# Patient Record
Sex: Female | Born: 1990 | Race: White | Hispanic: No | Marital: Married | Smoking: Never smoker
Health system: Southern US, Community
[De-identification: ages and names within clinical notes are randomized; demographics above are authoritative.]

## PROBLEM LIST (undated history)

## (undated) ENCOUNTER — Inpatient Hospital Stay (HOSPITAL_COMMUNITY): Payer: Self-pay

## (undated) DIAGNOSIS — K219 Gastro-esophageal reflux disease without esophagitis: Secondary | ICD-10-CM

## (undated) DIAGNOSIS — T7840XA Allergy, unspecified, initial encounter: Secondary | ICD-10-CM

## (undated) DIAGNOSIS — B019 Varicella without complication: Secondary | ICD-10-CM

## (undated) DIAGNOSIS — G08 Intracranial and intraspinal phlebitis and thrombophlebitis: Secondary | ICD-10-CM

## (undated) HISTORY — DX: Intracranial and intraspinal phlebitis and thrombophlebitis: G08

## (undated) HISTORY — PX: NO PAST SURGERIES: SHX2092

## (undated) HISTORY — PX: OTHER SURGICAL HISTORY: SHX169

## (undated) HISTORY — DX: Varicella without complication: B01.9

## (undated) HISTORY — DX: Allergy, unspecified, initial encounter: T78.40XA

---
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
## 2014-09-27 ENCOUNTER — Encounter: Payer: Self-pay | Admitting: Women's Health

## 2014-09-27 ENCOUNTER — Other Ambulatory Visit (HOSPITAL_COMMUNITY)
Admission: RE | Admit: 2014-09-27 | Discharge: 2014-09-27 | Disposition: A | Discharge status: Home / Self Care | Payer: BLUE CROSS/BLUE SHIELD | Source: Ambulatory Visit | Attending: Gynecology | Admitting: Gynecology

## 2014-09-27 ENCOUNTER — Ambulatory Visit (INDEPENDENT_AMBULATORY_CARE_PROVIDER_SITE_OTHER): Payer: BLUE CROSS/BLUE SHIELD | Admitting: Women's Health

## 2014-09-27 VITALS — BP 120/80 | Ht 62.0 in | Wt 194.0 lb

## 2014-09-27 DIAGNOSIS — Z01411 Encounter for gynecological examination (general) (routine) with abnormal findings: Secondary | ICD-10-CM | POA: Diagnosis not present

## 2014-09-27 DIAGNOSIS — Z01419 Encounter for gynecological examination (general) (routine) without abnormal findings: Secondary | ICD-10-CM | POA: Diagnosis not present

## 2014-09-27 DIAGNOSIS — Z23 Encounter for immunization: Secondary | ICD-10-CM

## 2014-09-27 DIAGNOSIS — Z30011 Encounter for initial prescription of contraceptive pills: Secondary | ICD-10-CM | POA: Diagnosis not present

## 2014-09-27 LAB — CBC WITH DIFFERENTIAL/PLATELET
Basophils Absolute: 0.1 10*3/uL (ref 0.0–0.1)
Basophils Relative: 1 % (ref 0–1)
Eosinophils Absolute: 0 10*3/uL (ref 0.0–0.7)
Eosinophils Relative: 0 % (ref 0–5)
HCT: 43.7 % (ref 36.0–46.0)
Hemoglobin: 14.1 g/dL (ref 12.0–15.0)
Lymphocytes Relative: 29 % (ref 12–46)
Lymphs Abs: 1.9 10*3/uL (ref 0.7–4.0)
MCH: 29 pg (ref 26.0–34.0)
MCHC: 32.3 g/dL (ref 30.0–36.0)
MCV: 89.9 fL (ref 78.0–100.0)
MPV: 11.5 fL (ref 8.6–12.4)
Monocytes Absolute: 0.5 10*3/uL (ref 0.1–1.0)
Monocytes Relative: 7 % (ref 3–12)
Neutro Abs: 4.2 10*3/uL (ref 1.7–7.7)
Neutrophils Relative %: 63 % (ref 43–77)
Platelets: 250 10*3/uL (ref 150–400)
RBC: 4.86 MIL/uL (ref 3.87–5.11)
RDW: 15.2 % (ref 11.5–15.5)
WBC: 6.7 10*3/uL (ref 4.0–10.5)

## 2014-09-27 MED ORDER — DESOGESTREL-ETHINYL ESTRADIOL 0.15-0.02/0.01 MG (21/5) PO TABS: 1.0000 | ORAL_TABLET | Freq: Every day | ORAL | Status: DC

## 2014-09-27 NOTE — Progress Notes (Signed)
Maria Brooks A Strader Oct 05, 1990 [id number]    History:    Presents for annual exam.   Regular monthly cycle/condoms. First partner , he had negative STD screen. Did not receive gardasil. Has not had a Pap smear.  Past medical history, past surgical history, family history and social history were all reviewed and documented in the EPIC chart.  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 Reports parents healthy. Getting married next month.  ROS:  A ROS was performed and pertinent positives and negatives are included.  Exam:  Filed Vitals:   09/27/14 1405  BP: 120/80    General appearance:  Normal Thyroid:  Symmetrical, normal in size, without palpable masses or nodularity. Respiratory  Auscultation:  Clear without wheezing or rhonchi Cardiovascular  Auscultation:  Regular rate, without rubs, murmurs or gallops  Edema/varicosities:  Not grossly evident Abdominal  Soft,nontender, without masses, guarding or rebound.  Liver/spleen:  No organomegaly noted  Hernia:  None appreciated  Skin  Inspection:  Grossly normal   Breasts: Examined lying and sitting.     Right: Without masses, retractions, discharge or axillary adenopathy.     Left: Without masses, retractions, discharge or axillary adenopathy. Gentitourinary   Inguinal/mons:  Normal without inguinal adenopathy  External genitalia:  Normal  BUS/Urethra/Skene's glands:  Normal  Vagina:  Normal  Cervix:  Normal  Uterus:   normal in size, shape and contour.  Midline and mobile  Adnexa/parametria:     Rt: Without masses or tenderness.   Lt: Without masses or tenderness.  Anus and perineum: Normal   Assessment/Plan:  24 y.o. SWF G0 for annual exam requesting contraception.    contraception management   Gardasil  obesity  Plan: contraception options reviewed, will try Mircette, prescription, proper use, slight risk for blood clots and strokes reviewed. Cycle was greater than one week ago will  start NuvaRing today remove April 9 have cycle and then start pills April 14th. Reviewed first month not contraceptive continue condoms.  SBE's, increase regular exercise and decrease calories for weight loss, calcium rich diet, MVI daily encouraged. First gardasil given, return to office in 2 and 6 months to complete series. CBC, rubella titer, UA, pap. Pap screening guidelines reviewed.   Harrington ChallengerYOUNG,NANCY J [facility] HospitalWHNP, 2:37 PM 09/27/2014

## 2014-09-27 NOTE — Addendum Note (Signed)
Addended by: Kem ParkinsonBARNES, Elai Vanwyk on: 09/27/2014 02:57 PM   Modules accepted: Orders

## 2014-09-27 NOTE — Patient Instructions (Signed)
Health Maintenance Adopting a healthy lifestyle and getting preventive care can go a long way to promote health and wellness. Talk with your health care provider about what schedule of regular examinations is right for you. This is a good chance for you to check in with your provider about disease prevention and staying healthy. In between checkups, there are plenty of things you can do on your own. Experts have done a lot of research about which lifestyle changes and preventive measures are most likely to keep you healthy. Ask your health care provider for more information. WEIGHT AND DIET  Eat a healthy diet  Be sure to include plenty of vegetables, fruits, low-fat dairy products, and lean protein.  Do not eat a lot of foods high in solid fats, added sugars, or salt.  Get regular exercise. This is one of the most important things you can do for your health.  Most adults should exercise for at least 150 minutes each week. The exercise should increase your heart rate and make you sweat (moderate-intensity exercise).  Most adults should also do strengthening exercises at least twice a week. This is in addition to the moderate-intensity exercise.  Maintain a healthy weight  Body mass index (BMI) is a measurement that can be used to identify possible weight problems. It estimates body fat based on height and weight. Your health care provider can help determine your BMI and help you achieve or maintain a healthy weight.  For females 25 years of age and older:   A BMI below 18.5 is considered underweight.  A BMI of 18.5 to 24.9 is normal.  A BMI of 25 to 29.9 is considered overweight.  A BMI of 30 and above is considered obese.  Watch levels of cholesterol and blood lipids  You should start having your blood tested for lipids and cholesterol at 24 years of age, then have this test every 5 years.  You may need to have your cholesterol levels checked more often if:  Your lipid or  cholesterol levels are high.  You are older than 24 years of age.  You are at high risk for heart disease.  CANCER SCREENING   Lung Cancer  Lung cancer screening is recommended for adults 97-92 years old who are at high risk for lung cancer because of a history of smoking.  A yearly low-dose CT scan of the lungs is recommended for people who:  Currently smoke.  Have quit within the past 15 years.  Have at least a 30-pack-year history of smoking. A pack year is smoking an average of one pack of cigarettes a day for 1 year.  Yearly screening should continue until it has been 15 years since you quit.  Yearly screening should stop if you develop a health problem that would prevent you from having lung cancer treatment.  Breast Cancer  Practice breast self-awareness. This means understanding how your breasts normally appear and feel.  It also means doing regular breast self-exams. Let your health care provider know about any changes, no matter how small.  If you are in your 20s or 30s, you should have a clinical breast exam (CBE) by a health care provider every 1-3 years as part of a regular health exam.  If you are 76 or older, have a CBE every year. Also consider having a breast X-ray (mammogram) every year.  If you have a family history of breast cancer, talk to your health care provider about genetic screening.  If you are  at high risk for breast cancer, talk to your health care provider about having an MRI and a mammogram every year.  Breast cancer gene (BRCA) assessment is recommended for women who have family members with BRCA-related cancers. BRCA-related cancers include:  Breast.  Ovarian.  Tubal.  Peritoneal cancers.  Results of the assessment will determine the need for genetic counseling and BRCA1 and BRCA2 testing. Cervical Cancer Routine pelvic examinations to screen for cervical cancer are no longer recommended for nonpregnant women who are considered low  risk for cancer of the pelvic organs (ovaries, uterus, and vagina) and who do not have symptoms. A pelvic examination may be necessary if you have symptoms including those associated with pelvic infections. Ask your health care provider if a screening pelvic exam is right for you.   The Pap test is the screening test for cervical cancer for women who are considered at risk.  If you had a hysterectomy for a problem that was not cancer or a condition that could lead to cancer, then you no longer need Pap tests.  If you are older than 65 years, and you have had normal Pap tests for the past 10 years, you no longer need to have Pap tests.  If you have had past treatment for cervical cancer or a condition that could lead to cancer, you need Pap tests and screening for cancer for at least 20 years after your treatment.  If you no longer get a Pap test, assess your risk factors if they change (such as having a new sexual partner). This can affect whether you should start being screened again.  Some women have medical problems that increase their chance of getting cervical cancer. If this is the case for you, your health care provider may recommend more frequent screening and Pap tests.  The human papillomavirus (HPV) test is another test that may be used for cervical cancer screening. The HPV test looks for the virus that can cause cell changes in the cervix. The cells collected during the Pap test can be tested for HPV.  The HPV test can be used to screen women 30 years of age and older. Getting tested for HPV can extend the interval between normal Pap tests from three to five years.  An HPV test also should be used to screen women of any age who have unclear Pap test results.  After 24 years of age, women should have HPV testing as often as Pap tests.  Colorectal Cancer  This type of cancer can be detected and often prevented.  Routine colorectal cancer screening usually begins at 24 years of  age and continues through 24 years of age.  Your health care provider may recommend screening at an earlier age if you have risk factors for colon cancer.  Your health care provider may also recommend using home test kits to check for hidden blood in the stool.  A small camera at the end of a tube can be used to examine your colon directly (sigmoidoscopy or colonoscopy). This is done to check for the earliest forms of colorectal cancer.  Routine screening usually begins at age 50.  Direct examination of the colon should be repeated every 5-10 years through 24 years of age. However, you may need to be screened more often if early forms of precancerous polyps or small growths are found. Skin Cancer  Check your skin from head to toe regularly.  Tell your health care provider about any new moles or changes in   moles, especially if there is a change in a mole's shape or color.  Also tell your health care provider if you have a mole that is larger than the size of a pencil eraser.  Always use sunscreen. Apply sunscreen liberally and repeatedly throughout the day.  Protect yourself by wearing long sleeves, pants, a wide-brimmed hat, and sunglasses whenever you are outside. HEART DISEASE, DIABETES, AND HIGH BLOOD PRESSURE   Have your blood pressure checked at least every 1-2 years. High blood pressure causes heart disease and increases the risk of stroke.  If you are between 75 years and 42 years old, ask your health care provider if you should take aspirin to prevent strokes.  Have regular diabetes screenings. This involves taking a blood sample to check your fasting blood sugar level.  If you are at a normal weight and have a low risk for diabetes, have this test once every three years after 25 years of age.  If you are overweight and have a high risk for diabetes, consider being tested at a younger age or more often. PREVENTING INFECTION  Hepatitis B  If you have a higher risk for  hepatitis B, you should be screened for this virus. You are considered at high risk for hepatitis B if:  You were born in a country where hepatitis B is common. Ask your health care provider which countries are considered high risk.  Your parents were born in a high-risk country, and you have not been immunized against hepatitis B (hepatitis B vaccine).  You have HIV or AIDS.  You use needles to inject street drugs.  You live with someone who has hepatitis B.  You have had sex with someone who has hepatitis B.  You get hemodialysis treatment.  You take certain medicines for conditions, including cancer, organ transplantation, and autoimmune conditions. Hepatitis C  Blood testing is recommended for:  Everyone born from [birth year] through 1965.  Anyone with known risk factors for hepatitis C. Sexually transmitted infections (STIs)  You should be screened for sexually transmitted infections (STIs) including gonorrhea and chlamydia if:  You are sexually active and are younger than 24 years of age.  You are older than 24 years of age and your health care provider tells you that you are at risk for this type of infection.  Your sexual activity has changed since you were last screened and you are at an increased risk for chlamydia or gonorrhea. Ask your health care provider if you are at risk.  If you do not have HIV, but are at risk, it may be recommended that you take a prescription medicine daily to prevent HIV infection. This is called pre-exposure prophylaxis (PrEP). You are considered at risk if:  You are sexually active and do not regularly use condoms or know the HIV status of your partner(s).  You take drugs by injection.  You are sexually active with a partner who has HIV. Talk with your health care provider about whether you are at high risk of being infected with HIV. If you choose to begin PrEP, you should first be tested for HIV. You should then be tested every 3 months for  as long as you are taking PrEP.  PREGNANCY   If you are premenopausal and you may become pregnant, ask your health care provider about preconception counseling.  If you may become pregnant, take 400 to 800 micrograms (mcg) of folic acid every day.  If you want to prevent pregnancy, talk to your  health care provider about birth control (contraception). OSTEOPOROSIS AND MENOPAUSE   Osteoporosis is a disease in which the bones lose minerals and strength with aging. This can result in serious bone fractures. Your risk for osteoporosis can be identified using a bone density scan.  If you are 8 years of age or older, or if you are at risk for osteoporosis and fractures, ask your health care provider if you should be screened.  Ask your health care provider whether you should take a calcium or vitamin D supplement to lower your risk for osteoporosis.  Menopause may have certain physical symptoms and risks.  Hormone replacement therapy may reduce some of these symptoms and risks. Talk to your health care provider about whether hormone replacement therapy is right for you.  HOME CARE INSTRUCTIONS   Schedule regular health, dental, and eye exams.  Stay current with your immunizations.   Do not use any tobacco products including cigarettes, chewing tobacco, or electronic cigarettes.  If you are pregnant, do not drink alcohol.  If you are breastfeeding, limit how much and how often you drink alcohol.  Limit alcohol intake to no more than 1 drink per day for nonpregnant women. One drink equals 12 ounces of beer, 5 ounces of wine, or 1 ounces of hard liquor.  Do not use street drugs.  Do not share needles.  Ask your health care provider for help if you need support or information about quitting drugs.  Tell your health care provider if you often feel depressed.  Tell your health care provider if you have ever been abused or do not feel safe at home. Document Released: 01/14/2011  Document Revised: 11/15/2013 Document Reviewed: 06/02/2013 [facility] Patient Information 2015 [place], [place]. This information is not intended to replace advice given to you by your health care provider. Make sure you discuss any questions you have with your health care provider. Fat and Cholesterol Control Diet Fat and cholesterol levels in your blood and organs are influenced by your diet. High levels of fat and cholesterol may lead to diseases of the heart, small and large blood vessels, gallbladder, liver, and pancreas. CONTROLLING FAT AND CHOLESTEROL WITH DIET Although exercise and lifestyle factors are important, your diet is key. That is because certain foods are known to raise cholesterol and others to lower it. The goal is to balance foods for their effect on cholesterol and more importantly, to replace saturated and trans fat with other types of fat, such as monounsaturated fat, polyunsaturated fat, and omega-3 fatty acids. On average, a person should consume no more than 15 to 17 g of saturated fat daily. Saturated and trans fats are considered "bad" fats, and they will raise LDL cholesterol. Saturated fats are primarily found in animal products such as meats, butter, and cream. However, that does not mean you need to give up all your favorite foods. Today, there are good tasting, low-fat, low-cholesterol substitutes for most of the things you like to eat. Choose low-fat or nonfat alternatives. Choose round or loin cuts of red meat. These types of cuts are lowest in fat and cholesterol. Chicken (without the skin), fish, veal, and ground [place] breast are great choices. Eliminate fatty meats, such as hot dogs and salami. Even shellfish have little or no saturated fat. Have a 3 oz (85 g) portion when you eat lean meat, poultry, or fish. Trans fats are also called "partially hydrogenated oils." They are oils that have been scientifically manipulated so that they are solid at room  temperature resulting  in a longer shelf life and improved taste and texture of foods in which they are added. Trans fats are found in stick margarine, some tub margarines, cookies, crackers, and baked goods.  When baking and cooking, oils are a great substitute for butter. The monounsaturated oils are especially beneficial since it is believed they lower LDL and raise HDL. The oils you should avoid entirely are saturated tropical oils, such as coconut and palm.  Remember to eat a lot from food groups that are naturally free of saturated and trans fat, including fish, fruit, vegetables, beans, grains (barley, rice, couscous, bulgur wheat), and pasta (without cream sauces).  IDENTIFYING FOODS THAT LOWER FAT AND CHOLESTEROL  Soluble fiber may lower your cholesterol. This type of fiber is found in fruits such as apples, vegetables such as broccoli, potatoes, and carrots, legumes such as beans, peas, and lentils, and grains such as barley. Foods fortified with plant sterols (phytosterol) may also lower cholesterol. You should eat at least 2 g per day of these foods for a cholesterol lowering effect.  Read package labels to identify low-saturated fats, trans fat free, and low-fat foods at the supermarket. Select cheeses that have only 2 to 3 g saturated fat per ounce. Use a heart-healthy tub margarine that is free of trans fats or partially hydrogenated oil. When buying baked goods (cookies, crackers), avoid partially hydrogenated oils. Breads and muffins should be made from whole grains (whole-wheat or whole oat flour, instead of "flour" or "enriched flour"). Buy non-creamy canned soups with reduced salt and no added fats.  FOOD PREPARATION TECHNIQUES  Never deep-fry. If you must fry, either stir-fry, which uses very little fat, or use non-stick cooking sprays. When possible, broil, bake, or roast meats, and steam vegetables. Instead of putting butter or margarine on vegetables, use lemon and herbs, applesauce, and cinnamon (for squash  and sweet potatoes). Use nonfat yogurt, salsa, and low-fat dressings for salads.  LOW-SATURATED FAT / LOW-FAT FOOD SUBSTITUTES Meats / Saturated Fat (g)  Avoid: Steak, marbled (3 oz/85 g) / 11 g  Choose: Steak, lean (3 oz/85 g) / 4 g  Avoid: Hamburger (3 oz/85 g) / 7 g  Choose: Hamburger, lean (3 oz/85 g) / 5 g  Avoid: Ham (3 oz/85 g) / 6 g  Choose: Ham, lean cut (3 oz/85 g) / 2.4 g  Avoid: Chicken, with skin, dark meat (3 oz/85 g) / 4 g  Choose: Chicken, skin removed, dark meat (3 oz/85 g) / 2 g  Avoid: Chicken, with skin, light meat (3 oz/85 g) / 2.5 g  Choose: Chicken, skin removed, light meat (3 oz/85 g) / 1 g Dairy / Saturated Fat (g)  Avoid: Whole milk (1 cup) / 5 g  Choose: Low-fat milk, 2% (1 cup) / 3 g  Choose: Low-fat milk, 1% (1 cup) / 1.5 g  Choose: Skim milk (1 cup) / 0.3 g  Avoid: Hard cheese (1 oz/28 g) / 6 g  Choose: Skim milk cheese (1 oz/28 g) / 2 to 3 g  Avoid: Cottage cheese, 4% fat (1 cup) / 6.5 g  Choose: Low-fat cottage cheese, 1% fat (1 cup) / 1.5 g  Avoid: Ice cream (1 cup) / 9 g  Choose: Sherbet (1 cup) / 2.5 g  Choose: Nonfat frozen yogurt (1 cup) / 0.3 g  Choose: Frozen fruit bar / trace  Avoid: Whipped cream (1 tbs) / 3.5 g  Choose: Nondairy whipped topping (1 tbs) / 1 g Condiments /  Saturated Fat (g)  Avoid: Mayonnaise (1 tbs) / 2 g  Choose: Low-fat mayonnaise (1 tbs) / 1 g  Avoid: Butter (1 tbs) / 7 g  Choose: Extra light margarine (1 tbs) / 1 g  Avoid: Coconut oil (1 tbs) / 11.8 g  Choose: Olive oil (1 tbs) / 1.8 g  Choose: Corn oil (1 tbs) / 1.7 g  Choose: Safflower oil (1 tbs) / 1.2 g  Choose: Sunflower oil (1 tbs) / 1.4 g  Choose: Soybean oil (1 tbs) / 2.4 g  Choose: Canola oil (1 tbs) / 1 g Document Released: 07/01/2005 Document Revised: 10/26/2012 Document Reviewed: 09/29/2013 [employer] Patient Information 2015 [place], [place]. This information is not intended to replace advice given to you by your health  care provider. Make sure you discuss any questions you have with your health care provider.

## 2014-09-28 LAB — RUBELLA SCREEN: Rubella: 2.44 Index — ABNORMAL HIGH (ref <0.90)

## 2014-09-29 LAB — CYTOLOGY - PAP

## 2014-11-30 ENCOUNTER — Other Ambulatory Visit: Payer: BLUE CROSS/BLUE SHIELD

## 2014-12-08 ENCOUNTER — Ambulatory Visit (INDEPENDENT_AMBULATORY_CARE_PROVIDER_SITE_OTHER): Payer: BLUE CROSS/BLUE SHIELD | Admitting: *Deleted

## 2014-12-08 DIAGNOSIS — Z23 Encounter for immunization: Secondary | ICD-10-CM

## 2015-01-06 ENCOUNTER — Other Ambulatory Visit: Payer: Self-pay

## 2015-01-06 DIAGNOSIS — Z30011 Encounter for initial prescription of contraceptive pills: Secondary | ICD-10-CM

## 2015-01-06 MED ORDER — DESOGESTREL-ETHINYL ESTRADIOL 0.15-0.02/0.01 MG (21/5) PO TABS: 1.0000 | ORAL_TABLET | Freq: Every day | ORAL | Status: DC

## 2015-09-08 ENCOUNTER — Inpatient Hospital Stay
Admission: EM | Admit: 2015-09-08 | Discharge: 2015-09-19 | DRG: 093 | Disposition: A | Discharge status: Home / Self Care | Payer: BLUE CROSS/BLUE SHIELD | Attending: Internal Medicine | Admitting: Internal Medicine

## 2015-09-08 ENCOUNTER — Encounter: Payer: Self-pay | Admitting: *Deleted

## 2015-09-08 DIAGNOSIS — M542 Cervicalgia: Secondary | ICD-10-CM | POA: Diagnosis present

## 2015-09-08 DIAGNOSIS — Z6834 Body mass index (BMI) 34.0-34.9, adult: Secondary | ICD-10-CM

## 2015-09-08 DIAGNOSIS — G08 Intracranial and intraspinal phlebitis and thrombophlebitis: Principal | ICD-10-CM | POA: Diagnosis present

## 2015-09-08 DIAGNOSIS — I639 Cerebral infarction, unspecified: Secondary | ICD-10-CM

## 2015-09-08 DIAGNOSIS — Z79899 Other long term (current) drug therapy: Secondary | ICD-10-CM

## 2015-09-08 DIAGNOSIS — E669 Obesity, unspecified: Secondary | ICD-10-CM | POA: Diagnosis present

## 2015-09-08 NOTE — ED Notes (Signed)
States right side neck pain radiating up to her head, states pressure behind her eyes, states she was seen at urgent care yesterday but the headache is still there

## 2015-09-08 NOTE — ED Notes (Signed)
Pt reports right sided neck pain radiating up head, felt behind her eyes.  Pt went to urgent care prescribed naproxen and robaxin, taking w/o relief.  Pain x 4 days.  PT NAD at this time, respirations equal and unlabored, skin warm and dry.

## 2015-09-09 ENCOUNTER — Emergency Department: Payer: BLUE CROSS/BLUE SHIELD

## 2015-09-09 DIAGNOSIS — I639 Cerebral infarction, unspecified: Secondary | ICD-10-CM | POA: Diagnosis present

## 2015-09-09 DIAGNOSIS — M542 Cervicalgia: Secondary | ICD-10-CM | POA: Diagnosis present

## 2015-09-09 DIAGNOSIS — E669 Obesity, unspecified: Secondary | ICD-10-CM | POA: Diagnosis present

## 2015-09-09 DIAGNOSIS — G08 Intracranial and intraspinal phlebitis and thrombophlebitis: Secondary | ICD-10-CM | POA: Diagnosis not present

## 2015-09-09 DIAGNOSIS — Z6834 Body mass index (BMI) 34.0-34.9, adult: Secondary | ICD-10-CM | POA: Diagnosis not present

## 2015-09-09 DIAGNOSIS — Z79899 Other long term (current) drug therapy: Secondary | ICD-10-CM | POA: Diagnosis not present

## 2015-09-09 HISTORY — DX: Intracranial and intraspinal phlebitis and thrombophlebitis: G08

## 2015-09-09 LAB — CBC WITH DIFFERENTIAL/PLATELET
BASOS ABS: 0 10*3/uL (ref 0–0.1)
BASOS PCT: 0 %
EOS ABS: 0 10*3/uL (ref 0–0.7)
EOS PCT: 0 %
HCT: 44.2 % (ref 35.0–47.0)
Hemoglobin: 14.9 g/dL (ref 12.0–16.0)
Lymphocytes Relative: 18 %
Lymphs Abs: 1.5 10*3/uL (ref 1.0–3.6)
MCH: 30.1 pg (ref 26.0–34.0)
MCHC: 33.7 g/dL (ref 32.0–36.0)
MCV: 89.3 fL (ref 80.0–100.0)
MONO ABS: 0.4 10*3/uL (ref 0.2–0.9)
Monocytes Relative: 5 %
Neutro Abs: 6.3 10*3/uL (ref 1.4–6.5)
Neutrophils Relative %: 77 %
PLATELETS: 192 10*3/uL (ref 150–440)
RBC: 4.95 MIL/uL (ref 3.80–5.20)
RDW: 14.3 % (ref 11.5–14.5)
WBC: 8.3 10*3/uL (ref 3.6–11.0)

## 2015-09-09 LAB — POCT PREGNANCY, URINE: PREG TEST UR: NEGATIVE

## 2015-09-09 LAB — TSH: TSH: 3.961 u[IU]/mL (ref 0.350–4.500)

## 2015-09-09 LAB — PROTIME-INR
INR: 1.12
PROTHROMBIN TIME: 14.6 s (ref 11.4–15.0)

## 2015-09-09 LAB — BASIC METABOLIC PANEL
Anion gap: 9 (ref 5–15)
BUN: 9 mg/dL (ref 6–20)
CALCIUM: 8.8 mg/dL — AB (ref 8.9–10.3)
CO2: 23 mmol/L (ref 22–32)
CREATININE: 0.77 mg/dL (ref 0.44–1.00)
Chloride: 107 mmol/L (ref 101–111)
GFR calc Af Amer: >60 mL/min (ref >60)
GLUCOSE: 89 mg/dL (ref 65–99)
Potassium: 3.7 mmol/L (ref 3.5–5.1)
SODIUM: 139 mmol/L (ref 135–145)

## 2015-09-09 LAB — ANTITHROMBIN III: AntiThromb III Func: 111 % (ref 75–120)

## 2015-09-09 LAB — HEPARIN LEVEL (UNFRACTIONATED): HEPARIN UNFRACTIONATED: 0.58 [IU]/mL (ref 0.30–0.70)

## 2015-09-09 LAB — APTT: aPTT: 31 seconds (ref 24–36)

## 2015-09-09 MED ORDER — ENOXAPARIN SODIUM 100 MG/ML ~~LOC~~ SOLN
SUBCUTANEOUS | Status: AC
  Administered 2015-09-09: 90 mg via SUBCUTANEOUS
  Filled 2015-09-09: qty 1

## 2015-09-09 MED ORDER — DOCUSATE SODIUM 100 MG PO CAPS
100.0000 mg | ORAL_CAPSULE | Freq: Two times a day (BID) | ORAL | Status: DC
  Administered 2015-09-09 – 2015-09-19 (×21): 100 mg via ORAL
  Filled 2015-09-09 (×21): qty 1

## 2015-09-09 MED ORDER — ENOXAPARIN SODIUM 100 MG/ML ~~LOC~~ SOLN
1.0000 mg/kg | Freq: Two times a day (BID) | SUBCUTANEOUS | Status: DC
  Filled 2015-09-09 (×2): qty 1

## 2015-09-09 MED ORDER — ONDANSETRON HCL 4 MG PO TABS
4.0000 mg | ORAL_TABLET | Freq: Four times a day (QID) | ORAL | Status: DC | PRN
  Administered 2015-09-10: 4 mg via ORAL
  Filled 2015-09-09: qty 1

## 2015-09-09 MED ORDER — ACETAMINOPHEN 325 MG PO TABS
650.0000 mg | ORAL_TABLET | Freq: Four times a day (QID) | ORAL | Status: DC | PRN
Start: 2015-09-09 — End: 2015-09-19
  Administered 2015-09-09 – 2015-09-15 (×4): 650 mg via ORAL
  Filled 2015-09-09 (×5): qty 2

## 2015-09-09 MED ORDER — IOHEXOL 350 MG/ML SOLN
100.0000 mL | Freq: Once | INTRAVENOUS | Status: AC | PRN
  Administered 2015-09-09: 100 mL via INTRAVENOUS

## 2015-09-09 MED ORDER — ONDANSETRON HCL 4 MG/2ML IJ SOLN
4.0000 mg | Freq: Four times a day (QID) | INTRAMUSCULAR | Status: DC | PRN
  Administered 2015-09-09 – 2015-09-12 (×4): 4 mg via INTRAVENOUS
  Filled 2015-09-09 (×4): qty 2

## 2015-09-09 MED ORDER — DEXAMETHASONE SODIUM PHOSPHATE 10 MG/ML IJ SOLN
10.0000 mg | Freq: Once | INTRAMUSCULAR | Status: AC
  Administered 2015-09-09: 10 mg via INTRAVENOUS
  Filled 2015-09-09: qty 1

## 2015-09-09 MED ORDER — ACETAMINOPHEN 650 MG RE SUPP: 650.0000 mg | Freq: Four times a day (QID) | RECTAL | Status: DC | PRN

## 2015-09-09 MED ORDER — SODIUM CHLORIDE 0.9 % IV BOLUS (SEPSIS)
1000.0000 mL | INTRAVENOUS | Status: AC
  Administered 2015-09-09: 1000 mL via INTRAVENOUS

## 2015-09-09 MED ORDER — ENOXAPARIN SODIUM 100 MG/ML ~~LOC~~ SOLN
90.0000 mg | Freq: Once | SUBCUTANEOUS | Status: AC
  Administered 2015-09-09: 90 mg via SUBCUTANEOUS

## 2015-09-09 MED ORDER — METOCLOPRAMIDE HCL 5 MG/ML IJ SOLN
10.0000 mg | INTRAMUSCULAR | Status: AC
  Administered 2015-09-09: 10 mg via INTRAVENOUS
  Filled 2015-09-09: qty 2

## 2015-09-09 MED ORDER — DIPHENHYDRAMINE HCL 50 MG/ML IJ SOLN
25.0000 mg | Freq: Once | INTRAMUSCULAR | Status: AC
  Administered 2015-09-09: 25 mg via INTRAVENOUS
  Filled 2015-09-09: qty 1

## 2015-09-09 MED ORDER — CALCIUM CARBONATE ANTACID 500 MG PO CHEW
1.0000 | CHEWABLE_TABLET | Freq: Two times a day (BID) | ORAL | Status: DC
  Administered 2015-09-09 – 2015-09-19 (×21): 200 mg via ORAL
  Filled 2015-09-09 (×21): qty 1

## 2015-09-09 MED ORDER — HEPARIN (PORCINE) IN NACL 100-0.45 UNIT/ML-% IJ SOLN
1150.0000 [IU]/h | INTRAMUSCULAR | Status: DC
  Administered 2015-09-09: 15:00:00 850 [IU]/h via INTRAVENOUS
  Administered 2015-09-10: 1000 [IU]/h via INTRAVENOUS
  Administered 2015-09-11 – 2015-09-16 (×6): 1250 [IU]/h via INTRAVENOUS
  Administered 2015-09-17: 1150 [IU]/h via INTRAVENOUS
  Administered 2015-09-17: 1250 [IU]/h via INTRAVENOUS
  Administered 2015-09-18: 1150 [IU]/h via INTRAVENOUS
  Filled 2015-09-09 (×24): qty 250

## 2015-09-09 MED ORDER — MORPHINE SULFATE (PF) 2 MG/ML IV SOLN
2.0000 mg | INTRAVENOUS | Status: DC | PRN
  Administered 2015-09-09 – 2015-09-12 (×9): 2 mg via INTRAVENOUS
  Filled 2015-09-09 (×9): qty 1

## 2015-09-09 MED ORDER — WARFARIN SODIUM 4 MG PO TABS: 5.0000 mg | ORAL_TABLET | Freq: Once | ORAL | Status: DC

## 2015-09-09 MED ORDER — WARFARIN - PHYSICIAN DOSING INPATIENT: Freq: Every day | Status: DC

## 2015-09-09 MED ORDER — SODIUM CHLORIDE 0.9 % IV SOLN
INTRAVENOUS | Status: DC
  Administered 2015-09-09 – 2015-09-11 (×8): via INTRAVENOUS

## 2015-09-09 MED ORDER — SODIUM CHLORIDE 0.9% FLUSH
3.0000 mL | Freq: Two times a day (BID) | INTRAVENOUS | Status: DC
  Administered 2015-09-11 – 2015-09-19 (×5): 3 mL via INTRAVENOUS

## 2015-09-09 NOTE — ED Notes (Signed)
Per Dr. Sheryle Hail, initiate cardiac monitoring once pt on floor

## 2015-09-09 NOTE — Progress Notes (Signed)
ANTICOAGULATION CONSULT NOTE - Initial Consult  Pharmacy Consult for Heparin Indication: stroke  No Known Allergies  Patient Measurements: Height:  (157.5 cm) Weight: 199 lb 8 oz (90.493 kg) IBW/kg (Calculated) : 50.1 Heparin Dosing Weight: 71 kg  Vital Signs: Temp: 98.7 F (37.1 C) (02/25 0614) Temp Source: Oral (02/25 0614) BP: 118/76 mmHg (02/25 0614) Pulse Rate: 116 (02/25 0614)  Labs:  Recent Labs  09/09/15 0058  HGB 14.9  HCT 44.2  PLT 192  APTT 31  LABPROT 14.6  INR 1.12  CREATININE 0.77    Estimated Creatinine Clearance: 113.5 mL/min (by C-G formula based on Cr of 0.77).   Medical History: History reviewed. No pertinent past medical history.  Medications:  Scheduled:  . calcium carbonate  1 tablet Oral BID WC  . docusate sodium  100 mg Oral BID  . sodium chloride flush  3 mL Intravenous Q12H   Infusions:  . sodium chloride 100 mL/hr at 09/09/15 1610  . 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 Patient received Lovenox 1 mg/kg once this AM.  Goal of Therapy:  Heparin level 0.3- 0.5 units/ml Monitor platelets by anticoagulation protocol: Yes   Plan:  Will treat as CVA with no bolus and lower HL goal. Will begin heparin drip 12 hours after Lovenox dose at 850 units/hr. Will check a HL 6 hours after starting infusion.   Luisa Hart D 09/09/2015,1:16 PM

## 2015-09-09 NOTE — Consult Note (Signed)
CC: headache  HPI: Maria Brooks is an 25 y.o. female with family history of clotting d/o admitted with 3 day history of throbbing HA radiating to her neck.  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  HA has improved. Pt is on Lovenox.    History reviewed. No pertinent past medical history.  Past Surgical History  Procedure Laterality Date  . None      Family History  Problem Relation Age of Onset  . Lung cancer Mother     deceased  . Clotting disorder Mother     reportedly present before developing lung cancer    Social History:  reports that she has never smoked. She does not have any smokeless tobacco history on file. She reports that she does not drink alcohol or use illicit drugs.  No Known Allergies  Medications: I have reviewed the patient's current medications.  ROS: History obtained from the patient  General ROS: negative for - chills, fatigue, fever, night sweats, weight gain or weight loss Psychological ROS: negative for - behavioral disorder, hallucinations, memory difficulties, mood swings or suicidal ideation Ophthalmic ROS: negative for - blurry vision, double vision, eye pain or loss of vision ENT ROS: negative for - epistaxis, nasal discharge, oral lesions, sore throat, tinnitus or vertigo Allergy and Immunology ROS: negative for - hives or itchy/watery eyes Hematological and Lymphatic ROS: negative for - bleeding problems, bruising or swollen lymph nodes Endocrine ROS: negative for - galactorrhea, hair pattern changes, polydipsia/polyuria or temperature intolerance Respiratory ROS: negative for - cough, hemoptysis, shortness of breath or wheezing Cardiovascular ROS: negative for - chest pain, dyspnea on exertion, edema or irregular heartbeat Gastrointestinal ROS: negative for - abdominal pain, diarrhea, hematemesis, nausea/vomiting or stool incontinence Genito-Urinary ROS: negative for - dysuria, hematuria,  incontinence or urinary frequency/urgency Musculoskeletal ROS: negative for - joint swelling or muscular weakness Neurological ROS: as noted in HPI Dermatological ROS: negative for rash and skin lesion changes  Physical Examination: Blood pressure 118/76, pulse 116, temperature 98.7 F (37.1 C), temperature source Oral, resp. rate 15, height 5\' 2"  (1.575 m), weight 199 lb 8 oz (90.493 kg), last menstrual period 08/25/2015, SpO2 99 %.   Neurological Examination Mental Status: Alert, oriented, thought content appropriate.  Speech fluent without evidence of aphasia.  Able to follow 3 step commands without difficulty. Cranial Nerves: II: Discs flat bilaterally; Visual fields grossly normal, pupils equal, round, reactive to light and accommodation III,IV, VI: ptosis not present, extra-ocular motions intact bilaterally V,VII: smile symmetric, facial light touch sensation normal bilaterally VIII: hearing normal bilaterally IX,X: gag reflex present XI: bilateral shoulder shrug XII: midline tongue extension Motor: Right : Upper extremity   5/5    Left:     Upper extremity   5/5  Lower extremity   5/5     Lower extremity   5/5 Tone and bulk:normal tone throughout; no atrophy noted Sensory: Pinprick and light touch intact throughout, bilaterally Deep Tendon Reflexes: 2+ and symmetric throughout Plantars: Right: downgoing   Left: downgoing Cerebellar: normal finger-to-nose, normal rapid alternating movements and normal heel-to-shin test Gait: not tested       Laboratory Studies:   Basic Metabolic Panel:  Recent Labs Lab 09/09/15 0058  NA 139  K 3.7  CL 107  CO2 23  GLUCOSE 89  BUN 9  CREATININE 0.77  CALCIUM 8.8*    Liver Function Tests: No results for input(s): AST, ALT, ALKPHOS, BILITOT, PROT, ALBUMIN in the last  168 hours. No results for input(s): LIPASE, AMYLASE in the last 168 hours. No results for input(s): AMMONIA in the last 168 hours.  CBC:  Recent Labs Lab  09/09/15 0058  WBC 8.3  NEUTROABS 6.3  HGB 14.9  HCT 44.2  MCV 89.3  PLT 192    Cardiac Enzymes: No results for input(s): CKTOTAL, CKMB, CKMBINDEX, TROPONINI in the last 168 hours.  BNP: Invalid input(s): POCBNP  CBG: No results for input(s): GLUCAP in the last 168 hours.  Microbiology: No results found for this or any previous visit.  Coagulation Studies:  Recent Labs  09/09/15 0058  LABPROT 14.6  INR 1.12    Urinalysis: No results for input(s): COLORURINE, LABSPEC, PHURINE, GLUCOSEU, HGBUR, BILIRUBINUR, KETONESUR, PROTEINUR, UROBILINOGEN, NITRITE, LEUKOCYTESUR in the last 168 hours.  Invalid input(s): APPERANCEUR  Lipid Panel:  No results found for: CHOL, TRIG, HDL, CHOLHDL, VLDL, LDLCALC  HgbA1C: No results found for: HGBA1C  Urine Drug Screen:  No results found for: LABOPIA, COCAINSCRNUR, LABBENZ, AMPHETMU, THCU, LABBARB  Alcohol Level: No results for input(s): ETH in the last 168 hours.  Other results: EKG: normal EKG, normal sinus rhythm, unchanged from previous tracings.  Imaging: Ct Angio Head W/cm &/or Wo Cm  09/09/2015  CLINICAL DATA:  RIGHT-sided neck pain radiating to head, posterior orbital pain for 4 days. EXAM: CT ANGIOGRAPHY HEAD AND NECK TECHNIQUE: Multidetector CT imaging of the head and neck was performed using the standard protocol during bolus administration of intravenous contrast. Multiplanar CT image reconstructions and MIPs were obtained to evaluate the vascular anatomy. Carotid stenosis measurements (when applicable) are obtained utilizing NASCET criteria, using the distal internal carotid diameter as the denominator. CONTRAST:  OMNIPAQUE IOHEXOL 350 MG/ML SOLN COMPARISON:  None. FINDINGS: CT HEAD The ventricles and sulci are normal. No intraparenchymal hemorrhage, mass effect nor midline shift. No acute large vascular territory infarcts. No abnormal extra-axial fluid collections. Asymmetric density RIGHT transverse, sigmoid sinus.  Equivocal Basal cisterns are patent. No skull fracture. The included ocular globes and orbital contents are non-suspicious. The mastoid aircells and included paranasal sinuses are well-aerated. CTA NECK Aortic arch: Normal appearance of the thoracic arch, normal branch pattern. The origins of the innominate, left Common carotid artery and subclavian artery are widely patent. Right carotid system: Common carotid artery is widely patent, coursing in a straight line fashion. Normal appearance of the carotid bifurcation without hemodynamically significant stenosis by NASCET criteria. Normal appearance of the included internal carotid artery. Left carotid system: Common carotid artery is widely patent, coursing in a straight line fashion. Normal appearance of the carotid bifurcation without hemodynamically significant stenosis by NASCET criteria. Normal appearance of the included internal carotid artery. Vertebral arteries:Left vertebral artery is dominant. Normal appearance of the vertebral arteries, which appear widely patent. Skeleton: No acute osseous process though bone windows have not been submitted. Other neck: Soft tissues of the neck are nonacute though, not tailored for evaluation. Subcentimeter thyroid nodules, below size followup recommendations. CTA HEAD Anterior circulation: Normal appearance of the cervical internal carotid arteries, petrous, cavernous and supra clinoid internal carotid arteries. Widely patent anterior communicating artery. Normal appearance of the anterior and middle cerebral arteries. Posterior circulation: Normal appearance of the vertebral arteries, vertebrobasilar junction and basilar artery, as well as main branch vessels. Normal appearance of the posterior cerebral arteries. No large vessel occlusion, hemodynamically significant stenosis, dissection, luminal irregularity, contrast extravasation or aneurysm within the anterior nor posterior circulation. Asymmetric loss of RIGHT  transverse, sigmoid and internal jugular vein enhancement.  IMPRESSION: CT HEAD: Dense RIGHT transverse, sigmoid sinus compatible with acute thrombosis. Otherwise negative CT head. CTA NECK:  Negative. CTA HEAD: Filling defect RIGHT transverse, sigmoid and internal jugular veins consistent with acute dural venous sinus thrombosis. Otherwise negative CTA head. Acute findings discussed with and reconfirmed by Dr.CORY [facility] on 09/09/2015 at 2:25 am. Electronically Signed   By: Awilda Metro M.D.   On: 09/09/2015 02:27   Ct Angio Neck W/cm &/or Wo/cm  09/09/2015  CLINICAL DATA:  RIGHT-sided neck pain radiating to head, posterior orbital pain for 4 days. EXAM: CT ANGIOGRAPHY HEAD AND NECK TECHNIQUE: Multidetector CT imaging of the head and neck was performed using the standard protocol during bolus administration of intravenous contrast. Multiplanar CT image reconstructions and MIPs were obtained to evaluate the vascular anatomy. Carotid stenosis measurements (when applicable) are obtained utilizing NASCET criteria, using the distal internal carotid diameter as the denominator. CONTRAST:  OMNIPAQUE IOHEXOL 350 MG/ML SOLN COMPARISON:  None. FINDINGS: CT HEAD The ventricles and sulci are normal. No intraparenchymal hemorrhage, mass effect nor midline shift. No acute large vascular territory infarcts. No abnormal extra-axial fluid collections. Asymmetric density RIGHT transverse, sigmoid sinus. Equivocal Basal cisterns are patent. No skull fracture. The included ocular globes and orbital contents are non-suspicious. The mastoid aircells and included paranasal sinuses are well-aerated. CTA NECK Aortic arch: Normal appearance of the thoracic arch, normal branch pattern. The origins of the innominate, left Common carotid artery and subclavian artery are widely patent. Right carotid system: Common carotid artery is widely patent, coursing in a straight line fashion. Normal appearance of the carotid bifurcation  without hemodynamically significant stenosis by NASCET criteria. Normal appearance of the included internal carotid artery. Left carotid system: Common carotid artery is widely patent, coursing in a straight line fashion. Normal appearance of the carotid bifurcation without hemodynamically significant stenosis by NASCET criteria. Normal appearance of the included internal carotid artery. Vertebral arteries:Left vertebral artery is dominant. Normal appearance of the vertebral arteries, which appear widely patent. Skeleton: No acute osseous process though bone windows have not been submitted. Other neck: Soft tissues of the neck are nonacute though, not tailored for evaluation. Subcentimeter thyroid nodules, below size followup recommendations. CTA HEAD Anterior circulation: Normal appearance of the cervical internal carotid arteries, petrous, cavernous and supra clinoid internal carotid arteries. Widely patent anterior communicating artery. Normal appearance of the anterior and middle cerebral arteries. Posterior circulation: Normal appearance of the vertebral arteries, vertebrobasilar junction and basilar artery, as well as main branch vessels. Normal appearance of the posterior cerebral arteries. No large vessel occlusion, hemodynamically significant stenosis, dissection, luminal irregularity, contrast extravasation or aneurysm within the anterior nor posterior circulation. Asymmetric loss of RIGHT transverse, sigmoid and internal jugular vein enhancement. IMPRESSION: CT HEAD: Dense RIGHT transverse, sigmoid sinus compatible with acute thrombosis. Otherwise negative CT head. CTA NECK:  Negative. CTA HEAD: Filling defect RIGHT transverse, sigmoid and internal jugular veins consistent with acute dural venous sinus thrombosis. Otherwise negative CTA head. Acute findings discussed with and reconfirmed by Dr.CORY [facility] on 09/09/2015 at 2:25 am. Electronically Signed   By: Awilda Metro M.D.   On: 09/09/2015 02:27      Assessment/Plan:  25 y.o. female with family history of clotting d/o admitted with 3 day history of throbbing HA radiating to her neck.  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  HA has improved. Pt is on Lovenox.   - pt started on heparin gtt instead of Lovenox -  Protein C, S and factor 5 sent, but I have added on more hypercoagulable profile - do not think she can be d/c until possibly sometime next week - repeat CTH and CTA middle of next week to see if there is a resolution or improvement - there is a risk for the thrombosis to have small hemorrhagic conversion, but the treatment stays the same with heparin gtt - hydration increased to 150cc/hr - d/w with family at bedside regarding prolonged hospitalization and treatment.   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    Pauletta Browns   09/09/2015, 1:42 PM

## 2015-09-09 NOTE — ED Notes (Signed)
Pt taken to CT.

## 2015-09-09 NOTE — Progress Notes (Signed)
ANTICOAGULATION CONSULT NOTE - Initial Consult  Pharmacy Consult for Heparin Indication: stroke  No Known Allergies  Patient Measurements: Height:  (157.5 cm) Weight: 199 lb 8 oz (90.493 kg) IBW/kg (Calculated) : 50.1 Heparin Dosing Weight: 71 kg  Vital Signs: Temp: 99 F (37.2 C) (02/25 2156) Temp Source: Oral (02/25 2156) BP: 114/70 mmHg (02/25 2156) Pulse Rate: 84 (02/25 2156)  Labs:  Recent Labs  09/09/15 0058 09/09/15 2130  HGB 14.9  --   HCT 44.2  --   PLT 192  --   APTT 31  --   LABPROT 14.6  --   INR 1.12  --   HEPARINUNFRC  --  0.58  CREATININE 0.77  --     Estimated Creatinine Clearance: 113.5 mL/min (by C-G formula based on Cr of 0.77).   Medical History: History reviewed. No pertinent past medical history.  Medications:  Scheduled:  . calcium carbonate  1 tablet Oral BID WC  . docusate sodium  100 mg Oral BID  . sodium chloride flush  3 mL Intravenous Q12H   Infusions:  . sodium chloride 150 mL/hr at 09/09/15 2031  . heparin 850 Units/hr (09/09/15 1449)    Assessment: 25 y/o F with dural venous thrombosis. Patient received Lovenox 1 mg/kg once this AM.  Goal of Therapy:  Heparin level 0.3- 0.5 units/ml Monitor platelets by anticoagulation protocol: Yes   Plan:  Will treat as CVA with no bolus and lower HL goal. Will begin heparin drip 12 hours after Lovenox dose at 850 units/hr. Will check a HL 6 hours after starting infusion.   2/25 2130 HL 0.58. HL slightly high with lower goal. Will decrease rate to 750 units/hr. Recheck in 6 hours.  Laurene Footman Maccia 09/09/2015,10:03 PM

## 2015-09-09 NOTE — H&P (Signed)
Maria Brooks is an 25 y.o. female.   Chief Complaint: Headache HPI: The patient presents to the emergency department complaining of headache that began 3 days ago. The headache was global and seems to radiate into her neck. It is worse when she turns her head to the right. Initially it was throbbing but now it is constant and dull. She has had 1 episode of nausea and vomiting today. Emesis was nonbloody and nonbilious. 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 Neurosurgery was contacted who recommended therapeutic anticoagulation which prompted the emergency department staff to call the hospitalist service for admission.  History reviewed. No pertinent past medical history. No chronic medical problems  Past Surgical History  Procedure Laterality Date  . None      Family History  Problem Relation Age of Onset  . Lung cancer Mother     deceased  . Clotting disorder Mother     reportedly present before developing lung cancer   Social History:  reports that she has never smoked. She does not have any smokeless tobacco history on file. She reports that she does not drink alcohol or use illicit drugs.  Allergies: No Known Allergies  Prior to Admission medications   Medication Sig Start Date End Date Taking? Authorizing Provider  desogestrel-ethinyl estradiol (KARIVA,AZURETTE,MIRCETTE) 0.15-0.02/0.01 MG (21/5) tablet Take 1 tablet by mouth daily. 01/06/15  Yes Huel Cote, NP     Results for orders placed or performed during the hospital encounter of 09/08/15 (from the past 48 hour(s))  Basic metabolic panel     Status: Abnormal   Collection Time: 09/09/15 12:58 AM  Result Value Ref Range   Sodium 139 135 - 145 mmol/L   Potassium 3.7 3.5 - 5.1 mmol/L   Chloride 107 101 - 111 mmol/L   CO2 23 22 - 32 mmol/L   Glucose, Bld 89 65 - 99 mg/dL   BUN 9 6 - 20 mg/dL    Creatinine, Ser 0.77 0.44 - 1.00 mg/dL   Calcium 8.8 (L) 8.9 - 10.3 mg/dL   GFR calc non Af Amer >60 >60 mL/min   GFR calc Af Amer >60 >60 mL/min    Comment: (NOTE) The eGFR has been calculated using the CKD EPI equation. This calculation has not been validated in all clinical situations. eGFR's persistently <60 mL/min signify possible Chronic Kidney Disease.    Anion gap 9 5 - 15  CBC with Differential/Platelet     Status: None   Collection Time: 09/09/15 12:58 AM  Result Value Ref Range   WBC 8.3 3.6 - 11.0 K/uL   RBC 4.95 3.80 - 5.20 MIL/uL   Hemoglobin 14.9 12.0 - 16.0 g/dL   HCT 44.2 35.0 - 47.0 %   MCV 89.3 80.0 - 100.0 fL   MCH 30.1 26.0 - 34.0 pg   MCHC 33.7 32.0 - 36.0 g/dL   RDW 14.3 11.5 - 14.5 %   Platelets 192 150 - 440 K/uL   Neutrophils Relative % 77 %   Neutro Abs 6.3 1.4 - 6.5 K/uL   Lymphocytes Relative 18 %   Lymphs Abs 1.5 1.0 - 3.6 K/uL   Monocytes Relative 5 %   Monocytes Absolute 0.4 0.2 - 0.9 K/uL   Eosinophils Relative 0 %   Eosinophils Absolute 0.0 0 - 0.7 K/uL   Basophils Relative 0 %   Basophils Absolute 0.0 0 -  0.1 K/uL  Protime-INR     Status: None   Collection Time: 09/09/15 12:58 AM  Result Value Ref Range   Prothrombin Time 14.6 11.4 - 15.0 seconds   INR 1.12   APTT     Status: None   Collection Time: 09/09/15 12:58 AM  Result Value Ref Range   aPTT 31 24 - 36 seconds  Pregnancy, urine POC     Status: None   Collection Time: 09/09/15  1:04 AM  Result Value Ref Range   Preg Test, Ur NEGATIVE NEGATIVE    Comment:        THE SENSITIVITY OF THIS METHODOLOGY IS >24 mIU/mL    Ct Angio Head W/cm &/or Wo Cm  09/09/2015  CLINICAL DATA:  RIGHT-sided neck pain radiating to head, posterior orbital pain for 4 days. EXAM: CT ANGIOGRAPHY HEAD AND NECK TECHNIQUE: Multidetector CT imaging of the head and neck was performed using the standard protocol during bolus administration of intravenous contrast. Multiplanar CT image reconstructions and MIPs  were obtained to evaluate the vascular anatomy. Carotid stenosis measurements (when applicable) are obtained utilizing NASCET criteria, using the distal internal carotid diameter as the denominator. CONTRAST:  171m OMNIPAQUE IOHEXOL 350 MG/ML SOLN COMPARISON:  None. FINDINGS: CT HEAD The ventricles and sulci are normal. No intraparenchymal hemorrhage, mass effect nor midline shift. No acute large vascular territory infarcts. No abnormal extra-axial fluid collections. Asymmetric density RIGHT transverse, sigmoid sinus. Equivocal Basal cisterns are patent. No skull fracture. The included ocular globes and orbital contents are non-suspicious. The mastoid aircells and included paranasal sinuses are well-aerated. CTA NECK Aortic arch: Normal appearance of the thoracic arch, normal branch pattern. The origins of the innominate, left Common carotid artery and subclavian artery are widely patent. Right carotid system: Common carotid artery is widely patent, coursing in a straight line fashion. Normal appearance of the carotid bifurcation without hemodynamically significant stenosis by NASCET criteria. Normal appearance of the included internal carotid artery. Left carotid system: Common carotid artery is widely patent, coursing in a straight line fashion. Normal appearance of the carotid bifurcation without hemodynamically significant stenosis by NASCET criteria. Normal appearance of the included internal carotid artery. Vertebral arteries:Left vertebral artery is dominant. Normal appearance of the vertebral arteries, which appear widely patent. Skeleton: No acute osseous process though bone windows have not been submitted. Other neck: Soft tissues of the neck are nonacute though, not tailored for evaluation. Subcentimeter thyroid nodules, below size followup recommendations. CTA HEAD Anterior circulation: Normal appearance of the cervical internal carotid arteries, petrous, cavernous and supra clinoid internal carotid  arteries. Widely patent anterior communicating artery. Normal appearance of the anterior and middle cerebral arteries. Posterior circulation: Normal appearance of the vertebral arteries, vertebrobasilar junction and basilar artery, as well as main branch vessels. Normal appearance of the posterior cerebral arteries. No large vessel occlusion, hemodynamically significant stenosis, dissection, luminal irregularity, contrast extravasation or aneurysm within the anterior nor posterior circulation. Asymmetric loss of RIGHT transverse, sigmoid and internal jugular vein enhancement. IMPRESSION: CT HEAD: Dense RIGHT transverse, sigmoid sinus compatible with acute thrombosis. Otherwise negative CT head. CTA NECK:  Negative. CTA HEAD: Filling defect RIGHT transverse, sigmoid and internal jugular veins consistent with acute dural venous sinus thrombosis. Otherwise negative CTA head. Acute findings discussed with and reconfirmed by Dr.CORY FSummerville [facility] Centeron 09/09/2015 at 2:25 am. Electronically Signed   By: CElon AlasM.D.   On: 09/09/2015 02:27   Ct Angio Neck W/cm &/or Wo/cm  09/09/2015  CLINICAL DATA:  RIGHT-sided neck pain  radiating to head, posterior orbital pain for 4 days. EXAM: CT ANGIOGRAPHY HEAD AND NECK TECHNIQUE: Multidetector CT imaging of the head and neck was performed using the standard protocol during bolus administration of intravenous contrast. Multiplanar CT image reconstructions and MIPs were obtained to evaluate the vascular anatomy. Carotid stenosis measurements (when applicable) are obtained utilizing NASCET criteria, using the distal internal carotid diameter as the denominator. CONTRAST:  167m OMNIPAQUE IOHEXOL 350 MG/ML SOLN COMPARISON:  None. FINDINGS: CT HEAD The ventricles and sulci are normal. No intraparenchymal hemorrhage, mass effect nor midline shift. No acute large vascular territory infarcts. No abnormal extra-axial fluid collections. Asymmetric density RIGHT transverse, sigmoid sinus.  Equivocal Basal cisterns are patent. No skull fracture. The included ocular globes and orbital contents are non-suspicious. The mastoid aircells and included paranasal sinuses are well-aerated. CTA NECK Aortic arch: Normal appearance of the thoracic arch, normal branch pattern. The origins of the innominate, left Common carotid artery and subclavian artery are widely patent. Right carotid system: Common carotid artery is widely patent, coursing in a straight line fashion. Normal appearance of the carotid bifurcation without hemodynamically significant stenosis by NASCET criteria. Normal appearance of the included internal carotid artery. Left carotid system: Common carotid artery is widely patent, coursing in a straight line fashion. Normal appearance of the carotid bifurcation without hemodynamically significant stenosis by NASCET criteria. Normal appearance of the included internal carotid artery. Vertebral arteries:Left vertebral artery is dominant. Normal appearance of the vertebral arteries, which appear widely patent. Skeleton: No acute osseous process though bone windows have not been submitted. Other neck: Soft tissues of the neck are nonacute though, not tailored for evaluation. Subcentimeter thyroid nodules, below size followup recommendations. CTA HEAD Anterior circulation: Normal appearance of the cervical internal carotid arteries, petrous, cavernous and supra clinoid internal carotid arteries. Widely patent anterior communicating artery. Normal appearance of the anterior and middle cerebral arteries. Posterior circulation: Normal appearance of the vertebral arteries, vertebrobasilar junction and basilar artery, as well as main branch vessels. Normal appearance of the posterior cerebral arteries. No large vessel occlusion, hemodynamically significant stenosis, dissection, luminal irregularity, contrast extravasation or aneurysm within the anterior nor posterior circulation. Asymmetric loss of RIGHT  transverse, sigmoid and internal jugular vein enhancement. IMPRESSION: CT HEAD: Dense RIGHT transverse, sigmoid sinus compatible with acute thrombosis. Otherwise negative CT head. CTA NECK:  Negative. CTA HEAD: Filling defect RIGHT transverse, sigmoid and internal jugular veins consistent with acute dural venous sinus thrombosis. Otherwise negative CTA head. Acute findings discussed with and reconfirmed by Dr.CORY FPromise [facility]on 09/09/2015 at 2:25 am. Electronically Signed   By: CElon AlasM.D.   On: 09/09/2015 02:27    Review of Systems  Constitutional: Negative for fever and chills.  HENT: Negative for sore throat and tinnitus.   Eyes: Negative for blurred vision and redness.  Respiratory: Negative for cough and shortness of breath.   Cardiovascular: Negative for chest pain, palpitations, orthopnea and PND.  Gastrointestinal: Positive for nausea (resolved) and vomiting. Negative for abdominal pain and diarrhea.  Genitourinary: Negative for dysuria, urgency and frequency.  Musculoskeletal: Negative for myalgias and joint pain.  Skin: Negative for rash.       No lesions  Neurological: Positive for headaches. Negative for speech change, focal weakness and weakness.  Endo/Heme/Allergies: Does not bruise/bleed easily.       No temperature intolerance  Psychiatric/Behavioral: Negative for depression and suicidal ideas.    Blood pressure 125/86, pulse 108, temperature 99.1 F (37.3 C), temperature source Oral, resp. rate 16, height  _0  (1.575 m), weight 90.175 kg (198 lb 12.8 oz), last menstrual period 08/25/2015, SpO2 97 %. Physical Exam  Vitals reviewed. Constitutional: She is oriented to person, place, and time. She appears well-developed and well-nourished. No distress.  HENT:  Head: Normocephalic and atraumatic.  Mouth/Throat: Oropharynx is clear and moist.  Eyes: Conjunctivae and EOM are normal. Pupils are equal, round, and reactive to light.  Neck: Normal range of motion. Neck  supple. No JVD present. Carotid bruit is not present. No tracheal deviation present. No thyromegaly present.  Cardiovascular: Normal rate, regular rhythm and normal heart sounds.  Exam reveals no gallop and no friction rub.   No murmur heard. Respiratory: Effort normal and breath sounds normal.  GI: Soft. Bowel sounds are normal. She exhibits no distension. There is no tenderness.  Genitourinary:  Deferred  Lymphadenopathy:    She has no cervical adenopathy.  Neurological: She is alert and oriented to person, place, and time. No cranial nerve deficit. She exhibits normal muscle tone.  Skin: Skin is warm and dry. No rash noted. No erythema.  Psychiatric: She has a normal mood and affect. Her behavior is normal. Judgment and thought content normal.     Assessment/Plan This is a 25 year old female admitted for dural venous thrombus. 1. Dural venous thrombosis: I have started the patient on therapeutic Lovenox which we will bridge to oral anticoagulation. The patient had been on oral birth control which I have held. She is a nonsmoker. We will check for clotting disorders as the patient's mother had a history of recurrent clots. Consult neurology. 2. Obesity: BMI is 34.8; encouraged healthy diet and exercise 3. DVT prophylaxis: As above 4. GI prophylaxis: None The patient is a full code. Time spent on admission orders and patient care approximately 35 minutes  Harrie Foreman, MD 09/09/2015, 3:54 AM

## 2015-09-09 NOTE — ED Provider Notes (Signed)
[facility] Emergency Department Provider Note  ____________________________________________  Time seen: Approximately 12:19 AM  I have reviewed the triage vital signs and the nursing notes.   HISTORY  Chief Complaint Headache and Neck Pain    HPI Maria Brooks is a 25 y.o. female with no significant past medical history including no history of migraines who presents with acute onset of right-sided neck pain 3 days ago that has been persistent and moderate in intensity.  It radiates up her head and is causing a global headache, more on the right but also throughout and behind her eyes.She has had nausea and vomiting (2 episodes of emesis today), and does feel that the light and loud noises make her headache worse.  She denies any focal neurological deficits including no numbness nor tingling and no weakness in her arms or her legs.  The pain in her neck is worse when she turns from side to side or looks up and down but her range of motion is not limited.  She did not awaken with a "crick in her neck" and states that it happened acutely 3 days ago with no history of trauma or injury.  He went to the urgent care and was prescribed a muscle relaxer and naproxen, but it has not helped.  Nothing makes it better and movement of her head and neck make it worse.   History reviewed. No pertinent past medical history.  There are no active problems to display for this patient.   History reviewed. No pertinent past surgical history.  Current Outpatient Rx  Name  Route  Sig  Dispense  Refill  . desogestrel-ethinyl estradiol (KARIVA,AZURETTE,MIRCETTE) 0.15-0.02/0.01 MG (21/5) tablet   Oral   Take 1 tablet by mouth daily.   3 Package   3     Allergies Review of patient's allergies indicates no known allergies.  History reviewed. No pertinent family history.  Social History Social History  Substance Use Topics  . Smoking status: Never Smoker   . Smokeless  tobacco: None  . Alcohol Use: No    Review of Systems Constitutional: No fever/chills Eyes: No visual changes. ENT: No sore throat. Cardiovascular: Denies chest pain. Respiratory: Denies shortness of breath. Gastrointestinal: No abdominal pain.  No nausea, no vomiting.  No diarrhea.  No constipation. Genitourinary: Negative for dysuria. Musculoskeletal: Pain in the right side of her neck radiating up to her head Skin: Negative for rash. Neurological: HEENT and the right side of her neck radiating up to her head and causing a global headache  10-point ROS otherwise negative.  ____________________________________________   PHYSICAL EXAM:  VITAL SIGNS: ED Triage Vitals  Enc Vitals Group     BP 09/08/15 2217 133/86 mmHg     Pulse Rate 09/08/15 2217 100     Resp 09/08/15 2217 18     Temp 09/08/15 2217 99.1 F (37.3 C)     Temp Source 09/08/15 2217 Oral     SpO2 09/08/15 2217 99 %     Weight 09/08/15 2217 190 lb (86.183 kg)     Height 09/08/15 2217  (1.575 m)     Head Cir --      Peak Flow --      Pain Score 09/08/15 2217 7     Pain Loc --      Pain Edu? --      Excl. in GC? --     Constitutional: Alert and oriented. Well appearing and in no acute distress though  she does appear uncomfortable. Eyes: Conjunctivae are normal. PERRL. EOMI. no papilledema on funduscopic exam. Head: Atraumatic. Nose: No congestion/rhinnorhea. Mouth/Throat: Mucous membranes are moist.  Oropharynx non-erythematous. Neck: No stridor.  No cervical spine tenderness to palpation.  No meningeal signs.  No tenderness to palpation of the right side of her neck.  No hematomas or swelling. Cardiovascular: Normal rate, regular rhythm. Grossly normal heart sounds.  Good peripheral circulation. Respiratory: Normal respiratory effort.  No retractions. Lungs CTAB. Gastrointestinal: Soft and nontender. No distention. No abdominal bruits. No CVA tenderness. Musculoskeletal: No lower extremity tenderness  nor edema.  No joint effusions. Neurologic:  Normal speech and language. No gross focal neurologic deficits are appreciated.  Skin:  Skin is warm, dry and intact. No rash noted. Psychiatric: Mood and affect are normal. Speech and behavior are normal.  ____________________________________________   LABS (all labs ordered are listed, but only abnormal results are displayed)  Labs Reviewed  BASIC METABOLIC PANEL - Abnormal; Notable for the following:    Calcium 8.8 (*)    All other components within normal limits  CBC WITH DIFFERENTIAL/PLATELET  PROTIME-INR  APTT  POC URINE PREG, ED  POCT PREGNANCY, URINE   ____________________________________________  EKG  None ____________________________________________  RADIOLOGY  I, Camila Norville, personally discussed these images and results by phone with the on-call radiologist and used this discussion as part of my medical decision making.    Ct Angio Head W/cm &/or Wo Cm  09/09/2015  CLINICAL DATA:  RIGHT-sided neck pain radiating to head, posterior orbital pain for 4 days. EXAM: CT ANGIOGRAPHY HEAD AND NECK TECHNIQUE: Multidetector CT imaging of the head and neck was performed using the standard protocol during bolus administration of intravenous contrast. Multiplanar CT image reconstructions and MIPs were obtained to evaluate the vascular anatomy. Carotid stenosis measurements (when applicable) are obtained utilizing NASCET criteria, using the distal internal carotid diameter as the denominator. CONTRAST:  OMNIPAQUE IOHEXOL 350 MG/ML SOLN COMPARISON:  None. FINDINGS: CT HEAD The ventricles and sulci are normal. No intraparenchymal hemorrhage, mass effect nor midline shift. No acute large vascular territory infarcts. No abnormal extra-axial fluid collections. Asymmetric density RIGHT transverse, sigmoid sinus. Equivocal Basal cisterns are patent. No skull fracture. The included ocular globes and orbital contents are non-suspicious. The  mastoid aircells and included paranasal sinuses are well-aerated. CTA NECK Aortic arch: Normal appearance of the thoracic arch, normal branch pattern. The origins of the innominate, left Common carotid artery and subclavian artery are widely patent. Right carotid system: Common carotid artery is widely patent, coursing in a straight line fashion. Normal appearance of the carotid bifurcation without hemodynamically significant stenosis by NASCET criteria. Normal appearance of the included internal carotid artery. Left carotid system: Common carotid artery is widely patent, coursing in a straight line fashion. Normal appearance of the carotid bifurcation without hemodynamically significant stenosis by NASCET criteria. Normal appearance of the included internal carotid artery. Vertebral arteries:Left vertebral artery is dominant. Normal appearance of the vertebral arteries, which appear widely patent. Skeleton: No acute osseous process though bone windows have not been submitted. Other neck: Soft tissues of the neck are nonacute though, not tailored for evaluation. Subcentimeter thyroid nodules, below size followup recommendations. CTA HEAD Anterior circulation: Normal appearance of the cervical internal carotid arteries, petrous, cavernous and supra clinoid internal carotid arteries. Widely patent anterior communicating artery. Normal appearance of the anterior and middle cerebral arteries. Posterior circulation: Normal appearance of the vertebral arteries, vertebrobasilar junction and basilar artery, as well as main branch vessels.  Normal appearance of the posterior cerebral arteries. No large vessel occlusion, hemodynamically significant stenosis, dissection, luminal irregularity, contrast extravasation or aneurysm within the anterior nor posterior circulation. Asymmetric loss of RIGHT transverse, sigmoid and internal jugular vein enhancement. IMPRESSION: CT HEAD: Dense RIGHT transverse, sigmoid sinus compatible  with acute thrombosis. Otherwise negative CT head. CTA NECK:  Negative. CTA HEAD: Filling defect RIGHT transverse, sigmoid and internal jugular veins consistent with acute dural venous sinus thrombosis. Otherwise negative CTA head. Acute findings discussed with and reconfirmed by Dr.Anibal Quinby [facility] on 09/09/2015 at 2:25 am. Electronically Signed   By: Awilda Metro M.D.   On: 09/09/2015 02:27   Ct Angio Neck W/cm &/or Wo/cm  09/09/2015  CLINICAL DATA:  RIGHT-sided neck pain radiating to head, posterior orbital pain for 4 days. EXAM: CT ANGIOGRAPHY HEAD AND NECK TECHNIQUE: Multidetector CT imaging of the head and neck was performed using the standard protocol during bolus administration of intravenous contrast. Multiplanar CT image reconstructions and MIPs were obtained to evaluate the vascular anatomy. Carotid stenosis measurements (when applicable) are obtained utilizing NASCET criteria, using the distal internal carotid diameter as the denominator. CONTRAST:  OMNIPAQUE IOHEXOL 350 MG/ML SOLN COMPARISON:  None. FINDINGS: CT HEAD The ventricles and sulci are normal. No intraparenchymal hemorrhage, mass effect nor midline shift. No acute large vascular territory infarcts. No abnormal extra-axial fluid collections. Asymmetric density RIGHT transverse, sigmoid sinus. Equivocal Basal cisterns are patent. No skull fracture. The included ocular globes and orbital contents are non-suspicious. The mastoid aircells and included paranasal sinuses are well-aerated. CTA NECK Aortic arch: Normal appearance of the thoracic arch, normal branch pattern. The origins of the innominate, left Common carotid artery and subclavian artery are widely patent. Right carotid system: Common carotid artery is widely patent, coursing in a straight line fashion. Normal appearance of the carotid bifurcation without hemodynamically significant stenosis by NASCET criteria. Normal appearance of the included internal carotid artery. Left  carotid system: Common carotid artery is widely patent, coursing in a straight line fashion. Normal appearance of the carotid bifurcation without hemodynamically significant stenosis by NASCET criteria. Normal appearance of the included internal carotid artery. Vertebral arteries:Left vertebral artery is dominant. Normal appearance of the vertebral arteries, which appear widely patent. Skeleton: No acute osseous process though bone windows have not been submitted. Other neck: Soft tissues of the neck are nonacute though, not tailored for evaluation. Subcentimeter thyroid nodules, below size followup recommendations. CTA HEAD Anterior circulation: Normal appearance of the cervical internal carotid arteries, petrous, cavernous and supra clinoid internal carotid arteries. Widely patent anterior communicating artery. Normal appearance of the anterior and middle cerebral arteries. Posterior circulation: Normal appearance of the vertebral arteries, vertebrobasilar junction and basilar artery, as well as main branch vessels. Normal appearance of the posterior cerebral arteries. No large vessel occlusion, hemodynamically significant stenosis, dissection, luminal irregularity, contrast extravasation or aneurysm within the anterior nor posterior circulation. Asymmetric loss of RIGHT transverse, sigmoid and internal jugular vein enhancement. IMPRESSION: CT HEAD: Dense RIGHT transverse, sigmoid sinus compatible with acute thrombosis. Otherwise negative CT head. CTA NECK:  Negative. CTA HEAD: Filling defect RIGHT transverse, sigmoid and internal jugular veins consistent with acute dural venous sinus thrombosis. Otherwise negative CTA head. Acute findings discussed with and reconfirmed by Dr.Alphons Burgert [facility] on 09/09/2015 at 2:25 am. Electronically Signed   By: Awilda Metro M.D.   On: 09/09/2015 02:27    ____________________________________________   PROCEDURES  Procedure(s) performed: None  Critical Care performed:  No ____________________________________________   INITIAL IMPRESSION / ASSESSMENT  AND PLAN / ED COURSE  Pertinent labs & imaging results that were available during my care of the patient were reviewed by me and considered in my medical decision making (see chart for details).  Given the acute onset of the neck pain with no history of trauma, the persistent headache, and the location and non-reproducibility of the neck pain, I am concerned about a vascular injury such as a vertebral artery dissection.  While most likely she is suffering from a migraine or perhaps torticollis that led to a migraine, I feel that we must rule out the potentially devastating vascular abnormalities.  I discussed all this with the patient and her husband who understand and agree.  In addition to basic lab work I will obtain a CT angios of her head and neck to evaluate the vasculature.  I will also treat her for her migraine but withhold the Toradol at this time until we know that she does not have a dissection or a bleed.  She is hemodynamically stable at this time.   (Note that documentation was delayed due to multiple ED patients requiring immediate care.)   Acute dural venous sinus thrombosis, discussed w/ radiologist.  Spoke by phone via [facility] with Dr. Roseanne Reno with [facility] neurolog[facility] to discuss whether the patient would benefit from transfer to their facility for possible endovascular intervention.  He advised against it because the treatment at this time, given no focal neurological deficits, we will simply be Lovenox and can be handled at [facility] regional.  I updated the patient and have spoken with the hospitalist to admit.  I gave her her first dose of Lovenox 90 mg (1 mg/kg).  ____________________________________________  FINAL CLINICAL IMPRESSION(S) / ED DIAGNOSES  Final diagnoses:  Dural venous sinus thrombosis      NEW MEDICATIONS STARTED DURING THIS VISIT:  New Prescriptions   No medications  on file      Please note that this document was prepared using voice recognition software and may include unintentional dictation errors.   Loleta Rose, MD 09/09/15 [phone]

## 2015-09-09 NOTE — Progress Notes (Signed)
[facility] - [place] at [facility]                                                                                                                                                                                            Patient Demographics   Maria Brooks, is a 25 y.o. female, DOB - [birth year], ZOX:[id number]  Admit date - 09/08/2015   Admitting Physician Arnaldo Natal, MD  Outpatient Primary MD for the patient is No PCP Per Patient   LOS - 0  Subjective: Patient reports that her headache is better. Denies any other complaints including visual difficulties or asymmetrical weakness     Review of Systems:   CONSTITUTIONAL: No documented fever. No fatigue, weakness. No weight gain, no weight loss.  EYES: No blurry or double vision.  ENT: No tinnitus. No postnasal drip. No redness of the oropharynx.  RESPIRATORY: No cough, no wheeze, no hemoptysis. No dyspnea.  CARDIOVASCULAR: No chest pain. No orthopnea. No palpitations. No syncope.  GASTROINTESTINAL: No nausea, no vomiting or diarrhea. No abdominal pain. No melena or hematochezia.  GENITOURINARY: No dysuria or hematuria.  ENDOCRINE: No polyuria or nocturia. No heat or cold intolerance.  HEMATOLOGY: No anemia. No bruising. No bleeding.  INTEGUMENTARY: No rashes. No lesions.  MUSCULOSKELETAL: No arthritis. No swelling. No gout.  NEUROLOGIC: No numbness, tingling, or ataxia. No seizure-type activity. Improved headache PSYCHIATRIC: No anxiety. No insomnia. No ADD.    Vitals:   Filed Vitals:   09/09/15 0500 09/09/15 0530 09/09/15 0556 09/09/15 0614  BP: 113/79 118/84 112/79 118/76  Pulse: 110 116 107 116  Temp:    98.7 F (37.1 C)  TempSrc:    Oral  Resp:  14 15   Height:    5\' 2"  (1.575 m)  Weight:    90.493 kg (199 lb 8 oz)  SpO2: 96% 96% 97% 99%    Wt Readings from Last 3 Encounters:  09/09/15 90.493 kg (199 lb 8 oz)  09/27/14 87.998 kg (194 lb)     Intake/Output Summary (Last 24 hours)  at 09/09/15 1425 Last data filed at 09/09/15 1300  Gross per 24 hour  Intake    480 ml  Output      0 ml  Net    480 ml    Physical Exam:   GENERAL: Pleasant-appearing in no apparent distress.  HEAD, EYES, EARS, NOSE AND THROAT: Atraumatic, normocephalic. Extraocular muscles are intact. Pupils equal and reactive to light. Sclerae anicteric. No conjunctival injection. No oro-pharyngeal erythema.  NECK: Supple. There is no jugular venous distention. No bruits, no lymphadenopathy, no thyromegaly.  HEART: Regular  rate and rhythm,. No murmurs, no rubs, no clicks.  LUNGS: Clear to auscultation bilaterally. No rales or rhonchi. No wheezes.  ABDOMEN: Soft, flat, nontender, nondistended. Has good bowel sounds. No hepatosplenomegaly appreciated.  EXTREMITIES: No evidence of any cyanosis, clubbing, or peripheral edema.  +2 pedal and radial pulses bilaterally.  NEUROLOGIC: The patient is alert, awake, and oriented x3 with no focal motor or sensory deficits appreciated bilaterally.  SKIN: Moist and warm with no rashes appreciated.  Psych: Not anxious, depressed LN: No inguinal LN enlargement    Antibiotics   Anti-infectives    None      Medications   Scheduled Meds: . calcium carbonate  1 tablet Oral BID WC  . docusate sodium  100 mg Oral BID  . sodium chloride flush  3 mL Intravenous Q12H   Continuous Infusions: . sodium chloride 150 mL/hr at 09/09/15 1356  . heparin     PRN Meds:.acetaminophen **OR** acetaminophen, morphine injection, ondansetron **OR** ondansetron (ZOFRAN) IV   Data Review:   Micro Results No results found for this or any previous visit (from the past 240 hour(s)).  Radiology Reports Ct Angio Head W/cm &/or Wo Cm  09/09/2015  CLINICAL DATA:  RIGHT-sided neck pain radiating to head, posterior orbital pain for 4 days. EXAM: CT ANGIOGRAPHY HEAD AND NECK TECHNIQUE: Multidetector CT imaging of the head and neck was performed using the standard protocol during  bolus administration of intravenous contrast. Multiplanar CT image reconstructions and MIPs were obtained to evaluate the vascular anatomy. Carotid stenosis measurements (when applicable) are obtained utilizing NASCET criteria, using the distal internal carotid diameter as the denominator. CONTRAST:  OMNIPAQUE IOHEXOL 350 MG/ML SOLN COMPARISON:  None. FINDINGS: CT HEAD The ventricles and sulci are normal. No intraparenchymal hemorrhage, mass effect nor midline shift. No acute large vascular territory infarcts. No abnormal extra-axial fluid collections. Asymmetric density RIGHT transverse, sigmoid sinus. Equivocal Basal cisterns are patent. No skull fracture. The included ocular globes and orbital contents are non-suspicious. The mastoid aircells and included paranasal sinuses are well-aerated. CTA NECK Aortic arch: Normal appearance of the thoracic arch, normal branch pattern. The origins of the innominate, left Common carotid artery and subclavian artery are widely patent. Right carotid system: Common carotid artery is widely patent, coursing in a straight line fashion. Normal appearance of the carotid bifurcation without hemodynamically significant stenosis by NASCET criteria. Normal appearance of the included internal carotid artery. Left carotid system: Common carotid artery is widely patent, coursing in a straight line fashion. Normal appearance of the carotid bifurcation without hemodynamically significant stenosis by NASCET criteria. Normal appearance of the included internal carotid artery. Vertebral arteries:Left vertebral artery is dominant. Normal appearance of the vertebral arteries, which appear widely patent. Skeleton: No acute osseous process though bone windows have not been submitted. Other neck: Soft tissues of the neck are nonacute though, not tailored for evaluation. Subcentimeter thyroid nodules, below size followup recommendations. CTA HEAD Anterior circulation: Normal appearance of the  cervical internal carotid arteries, petrous, cavernous and supra clinoid internal carotid arteries. Widely patent anterior communicating artery. Normal appearance of the anterior and middle cerebral arteries. Posterior circulation: Normal appearance of the vertebral arteries, vertebrobasilar junction and basilar artery, as well as main branch vessels. Normal appearance of the posterior cerebral arteries. No large vessel occlusion, hemodynamically significant stenosis, dissection, luminal irregularity, contrast extravasation or aneurysm within the anterior nor posterior circulation. Asymmetric loss of RIGHT transverse, sigmoid and internal jugular vein enhancement. IMPRESSION: CT HEAD: Dense RIGHT transverse, sigmoid sinus compatible  with acute thrombosis. Otherwise negative CT head. CTA NECK:  Negative. CTA HEAD: Filling defect RIGHT transverse, sigmoid and internal jugular veins consistent with acute dural venous sinus thrombosis. Otherwise negative CTA head. Acute findings discussed with and reconfirmed by Dr.CORY [facility] on 09/09/2015 at 2:25 am. Electronically Signed   By: Awilda Metro M.D.   On: 09/09/2015 02:27   Ct Angio Neck W/cm &/or Wo/cm  09/09/2015  CLINICAL DATA:  RIGHT-sided neck pain radiating to head, posterior orbital pain for 4 days. EXAM: CT ANGIOGRAPHY HEAD AND NECK TECHNIQUE: Multidetector CT imaging of the head and neck was performed using the standard protocol during bolus administration of intravenous contrast. Multiplanar CT image reconstructions and MIPs were obtained to evaluate the vascular anatomy. Carotid stenosis measurements (when applicable) are obtained utilizing NASCET criteria, using the distal internal carotid diameter as the denominator. CONTRAST:  OMNIPAQUE IOHEXOL 350 MG/ML SOLN COMPARISON:  None. FINDINGS: CT HEAD The ventricles and sulci are normal. No intraparenchymal hemorrhage, mass effect nor midline shift. No acute large vascular territory infarcts. No  abnormal extra-axial fluid collections. Asymmetric density RIGHT transverse, sigmoid sinus. Equivocal Basal cisterns are patent. No skull fracture. The included ocular globes and orbital contents are non-suspicious. The mastoid aircells and included paranasal sinuses are well-aerated. CTA NECK Aortic arch: Normal appearance of the thoracic arch, normal branch pattern. The origins of the innominate, left Common carotid artery and subclavian artery are widely patent. Right carotid system: Common carotid artery is widely patent, coursing in a straight line fashion. Normal appearance of the carotid bifurcation without hemodynamically significant stenosis by NASCET criteria. Normal appearance of the included internal carotid artery. Left carotid system: Common carotid artery is widely patent, coursing in a straight line fashion. Normal appearance of the carotid bifurcation without hemodynamically significant stenosis by NASCET criteria. Normal appearance of the included internal carotid artery. Vertebral arteries:Left vertebral artery is dominant. Normal appearance of the vertebral arteries, which appear widely patent. Skeleton: No acute osseous process though bone windows have not been submitted. Other neck: Soft tissues of the neck are nonacute though, not tailored for evaluation. Subcentimeter thyroid nodules, below size followup recommendations. CTA HEAD Anterior circulation: Normal appearance of the cervical internal carotid arteries, petrous, cavernous and supra clinoid internal carotid arteries. Widely patent anterior communicating artery. Normal appearance of the anterior and middle cerebral arteries. Posterior circulation: Normal appearance of the vertebral arteries, vertebrobasilar junction and basilar artery, as well as main branch vessels. Normal appearance of the posterior cerebral arteries. No large vessel occlusion, hemodynamically significant stenosis, dissection, luminal irregularity, contrast  extravasation or aneurysm within the anterior nor posterior circulation. Asymmetric loss of RIGHT transverse, sigmoid and internal jugular vein enhancement. IMPRESSION: CT HEAD: Dense RIGHT transverse, sigmoid sinus compatible with acute thrombosis. Otherwise negative CT head. CTA NECK:  Negative. CTA HEAD: Filling defect RIGHT transverse, sigmoid and internal jugular veins consistent with acute dural venous sinus thrombosis. Otherwise negative CTA head. Acute findings discussed with and reconfirmed by Dr.CORY [facility] on 09/09/2015 at 2:25 am. Electronically Signed   By: Awilda Metro M.D.   On: 09/09/2015 02:27     CBC  Recent Labs Lab 09/09/15 0058  WBC 8.3  HGB 14.9  HCT 44.2  PLT 192  MCV 89.3  MCH 30.1  MCHC 33.7  RDW 14.3  LYMPHSABS 1.5  MONOABS 0.4  EOSABS 0.0  BASOSABS 0.0    Chemistries   Recent Labs Lab 09/09/15 0058  NA 139  K 3.7  CL 107  CO2 23  GLUCOSE 89  BUN 9  CREATININE 0.77  CALCIUM 8.8*   ------------------------------------------------------------------------------------------------------------------ estimated creatinine clearance is 113.5 mL/min (by C-G formula based on Cr of 0.77). ------------------------------------------------------------------------------------------------------------------ No results for input(s): HGBA1C in the last 72 hours. ------------------------------------------------------------------------------------------------------------------ No results for input(s): CHOL, HDL, LDLCALC, TRIG, CHOLHDL, LDLDIRECT in the last 72 hours. ------------------------------------------------------------------------------------------------------------------  Recent Labs  09/09/15 0058  TSH 3.961   ------------------------------------------------------------------------------------------------------------------ No results for input(s): VITAMINB12, FOLATE, FERRITIN, TIBC, IRON, RETICCTPCT in the last 72 hours.  Coagulation  profile  Recent Labs Lab 09/09/15 0058  INR 1.12    No results for input(s): DDIMER in the last 72 hours.  Cardiac Enzymes No results for input(s): CKMB, TROPONINI, MYOGLOBIN in the last 168 hours.  Invalid input(s): CK ------------------------------------------------------------------------------------------------------------------ Invalid input(s): POCBNP    Assessment & Plan   This is a 25 year old female admitted for dural venous thrombus. 1. Dural venous thrombosis:  Patient on on birth control pills. And a family history of having clots. I have discussed the case with neurology. He recommends patients be started on a heparin drip instead of Lovenox and recommends patient to stay in the hospital for a few days on IV heparin therapy with the repeat imaging due to severity of the thrombus and extension of the thrombus. Hypercoagulable workup is also sent. I have explained the patient about the diagnosis. An workup.   2. Obesity: BMI is 34.8; encouraged healthy diet and exercise 3. DVT prophylaxis: As above 4. GI prophylaxis: None The patient is a full code.       Code Status Orders        Start     Ordered   09/09/15 1610  Full code   Continuous     09/09/15 0610    Code Status History    Date Active Date Inactive Code Status Order ID Comments User Context   This patient has a current code status but no historical code status.           Consults  neurology DVT Prophylaxis  heparin  Lab Results  Component Value Date   PLT 192 09/09/2015     Time Spent in minutes   45 minutes  Greater than 50% of time spent in care coordination and counseling patient regarding the condition and plan of care. Patient understands.   Auburn Bilberry M.D on 09/09/2015 at 2:25 PM  Between 7am to 6pm - Pager - [phone]  After 6pm go to [email or web address] - password [email or web address] [facility]  [facility] [place] [facility]   Office  [phone]

## 2015-09-09 NOTE — Plan of Care (Signed)
Problem: Fluid Volume: Goal: Ability to maintain a balanced intake and output will improve Outcome: Progressing Pt with complaints of mild HA tylenol with improvement, pt started heparin drip, voices any needs, husband at bedside

## 2015-09-10 LAB — HEPARIN LEVEL (UNFRACTIONATED)
HEPARIN UNFRACTIONATED: 0.12 [IU]/mL — AB (ref 0.30–0.70)
HEPARIN UNFRACTIONATED: 0.15 [IU]/mL — AB (ref 0.30–0.70)
Heparin Unfractionated: 0.16 IU/mL — ABNORMAL LOW (ref 0.30–0.70)
Heparin Unfractionated: 0.16 IU/mL — ABNORMAL LOW (ref 0.30–0.70)

## 2015-09-10 LAB — CBC
HCT: 37 % (ref 35.0–47.0)
HEMOGLOBIN: 12.3 g/dL (ref 12.0–16.0)
MCH: 29.7 pg (ref 26.0–34.0)
MCHC: 33.3 g/dL (ref 32.0–36.0)
MCV: 89.1 fL (ref 80.0–100.0)
PLATELETS: 150 10*3/uL (ref 150–440)
RBC: 4.15 MIL/uL (ref 3.80–5.20)
RDW: 14.2 % (ref 11.5–14.5)
WBC: 5.6 10*3/uL (ref 3.6–11.0)

## 2015-09-10 LAB — HEMOGLOBIN A1C: Hgb A1c MFr Bld: 4.8 % (ref 4.0–6.0)

## 2015-09-10 MED ORDER — OXYCODONE-ACETAMINOPHEN 7.5-325 MG PO TABS
1.0000 | ORAL_TABLET | Freq: Four times a day (QID) | ORAL | Status: DC | PRN
  Administered 2015-09-10: 1 via ORAL
  Filled 2015-09-10: qty 1

## 2015-09-10 MED ORDER — IBUPROFEN 400 MG PO TABS: 200.0000 mg | ORAL_TABLET | Freq: Three times a day (TID) | ORAL | Status: DC | PRN

## 2015-09-10 MED ORDER — HYDROCODONE-ACETAMINOPHEN 5-325 MG PO TABS: 1.0000 | ORAL_TABLET | ORAL | Status: DC | PRN

## 2015-09-10 NOTE — Progress Notes (Signed)
ANTICOAGULATION CONSULT NOTE - Initial Consult  Pharmacy Consult for Heparin Indication: stroke  No Known Allergies  Patient Measurements: Height:  (157.5 cm) Weight: 208 lb (94.348 kg) IBW/kg (Calculated) : 50.1 Heparin Dosing Weight: 71 kg  Vital Signs: Temp: 98.2 F (36.8 C) (02/26 1349) Temp Source: Oral (02/26 1349) BP: 125/82 mmHg (02/26 1349) Pulse Rate: 88 (02/26 1349)  Labs:  Recent Labs  09/09/15 0058  09/10/15 0439 09/10/15 1200 09/10/15 1855  HGB 14.9  --  12.3  --   --   HCT 44.2  --  37.0  --   --   PLT 192  --  150  --   --   APTT 31  --   --   --   --   LABPROT 14.6  --   --   --   --   INR 1.12  --   --   --   --   HEPARINUNFRC  --   < > 0.12* 0.15* 0.16*  CREATININE 0.77  --   --   --   --   < > = values in this interval not displayed.  Estimated Creatinine Clearance: 116.1 mL/min (by C-G formula based on Cr of 0.77).   Medical History: History reviewed. No pertinent past medical history.  Medications:  Scheduled:  . calcium carbonate  1 tablet Oral BID WC  . docusate sodium  100 mg Oral BID  . sodium chloride flush  3 mL Intravenous Q12H   Infusions:  . sodium chloride 150 mL/hr at 09/10/15 1604  . heparin 1,000 Units/hr (09/10/15 1311)    Assessment: 25 y/o F with dural venous thrombosis. Patient received Lovenox 1 mg/kg once this AM.  Goal of Therapy:  Heparin level 0.3- 0.5 units/ml Monitor platelets by anticoagulation protocol: Yes   Plan:  Will treat as CVA with no bolus and lower HL goal.   Heparin level remains below goal. Spoke with night shift nurse who states that she was told the drip had been turned off for a little while but there is no documentation of when or for how long. She states that the nurse who took over at 1500 relayed this information to her. Heparin is infusing now and when she took over at 1900. Cannot rule out that drip was turned off between now and the last dose increase. Therefore I not make any  changes and will redraw the lab 2300    Olene Floss, Pharm.D Clinical Pharmacist   09/10/2015,8:02 PM

## 2015-09-10 NOTE — Progress Notes (Signed)
[facility] - [place] at [facility]                                                                                                                                                                                            Patient Demographics   Maria Brooks, is a 25 y.o. female, DOB - 08-22-1990, ZOX:[id number]  Admit date - 09/08/2015   Admitting Physician Arnaldo Natal, MD  Outpatient Primary MD for the patient is No PCP Per Patient   LOS - 1  Subjective: Patient continues to have intermittent worsening headache. Denies any visual difficulty or any asymmetrical weakness    Review of Systems:   CONSTITUTIONAL: No documented fever. No fatigue, weakness. No weight gain, no weight loss.  EYES: No blurry or double vision.  ENT: No tinnitus. No postnasal drip. No redness of the oropharynx.  RESPIRATORY: No cough, no wheeze, no hemoptysis. No dyspnea.  CARDIOVASCULAR: No chest pain. No orthopnea. No palpitations. No syncope.  GASTROINTESTINAL: No nausea, no vomiting or diarrhea. No abdominal pain. No melena or hematochezia.  GENITOURINARY: No dysuria or hematuria.  ENDOCRINE: No polyuria or nocturia. No heat or cold intolerance.  HEMATOLOGY: No anemia. No bruising. No bleeding.  INTEGUMENTARY: No rashes. No lesions.  MUSCULOSKELETAL: No arthritis. No swelling. No gout.  NEUROLOGIC: No numbness, tingling, or ataxia. No seizure-type activity. Persistent headache PSYCHIATRIC: No anxiety. No insomnia. No ADD.    Vitals:   Filed Vitals:   09/09/15 0614 09/09/15 1533 09/09/15 2156 09/10/15 0619  BP: 118/76 110/72 114/70 97/66  Pulse: 116 85 84 71  Temp: 98.7 F (37.1 C) 98.8 F (37.1 C) 99 F (37.2 C) 97.9 F (36.6 C)  TempSrc: Oral Oral Oral Oral  Resp:  Height:  (1.575 m)     Weight: 90.493 kg (199 lb 8 oz)   94.348 kg (208 lb)  SpO2: 99% 100% 97% 93%    Wt Readings from Last 3 Encounters:  09/10/15 94.348 kg (208 lb)  09/27/14  87.998 kg (194 lb)     Intake/Output Summary (Last 24 hours) at 09/10/15 1203 Last data filed at 09/09/15 1700  Gross per 24 hour  Intake    480 ml  Output      0 ml  Net    480 ml    Physical Exam:   GENERAL: Pleasant-appearing in no apparent distress.  HEAD, EYES, EARS, NOSE AND THROAT: Atraumatic, normocephalic. Extraocular muscles are intact. Pupils equal and reactive to light. Sclerae anicteric. No conjunctival injection. No oro-pharyngeal erythema.  NECK: Supple. There is no jugular venous distention. No bruits,  no lymphadenopathy, no thyromegaly.  HEART: Regular rate and rhythm,. No murmurs, no rubs, no clicks.  LUNGS: Clear to auscultation bilaterally. No rales or rhonchi. No wheezes.  ABDOMEN: Soft, flat, nontender, nondistended. Has good bowel sounds. No hepatosplenomegaly appreciated.  EXTREMITIES: No evidence of any cyanosis, clubbing, or peripheral edema.  +2 pedal and radial pulses bilaterally.  NEUROLOGIC: The patient is alert, awake, and oriented x3 with no focal motor or sensory deficits appreciated bilaterally.  SKIN: Moist and warm with no rashes appreciated.  Psych: Not anxious, depressed LN: No inguinal LN enlargement    Antibiotics   Anti-infectives    None      Medications   Scheduled Meds: . calcium carbonate  1 tablet Oral BID WC  . docusate sodium  100 mg Oral BID  . sodium chloride flush  3 mL Intravenous Q12H   Continuous Infusions: . sodium chloride 150 mL/hr at 09/10/15 0920  . heparin 800 Units/hr (09/10/15 0609)   PRN Meds:.acetaminophen **OR** acetaminophen, ibuprofen, morphine injection, ondansetron **OR** ondansetron (ZOFRAN) IV, oxyCODONE-acetaminophen   Data Review:   Micro Results No results found for this or any previous visit (from the past 240 hour(s)).  Radiology Reports Ct Angio Head W/cm &/or Wo Cm  09/09/2015  CLINICAL DATA:  RIGHT-sided neck pain radiating to head, posterior orbital pain for 4 days. EXAM: CT  ANGIOGRAPHY HEAD AND NECK TECHNIQUE: Multidetector CT imaging of the head and neck was performed using the standard protocol during bolus administration of intravenous contrast. Multiplanar CT image reconstructions and MIPs were obtained to evaluate the vascular anatomy. Carotid stenosis measurements (when applicable) are obtained utilizing NASCET criteria, using the distal internal carotid diameter as the denominator. CONTRAST:  OMNIPAQUE IOHEXOL 350 MG/ML SOLN COMPARISON:  None. FINDINGS: CT HEAD The ventricles and sulci are normal. No intraparenchymal hemorrhage, mass effect nor midline shift. No acute large vascular territory infarcts. No abnormal extra-axial fluid collections. Asymmetric density RIGHT transverse, sigmoid sinus. Equivocal Basal cisterns are patent. No skull fracture. The included ocular globes and orbital contents are non-suspicious. The mastoid aircells and included paranasal sinuses are well-aerated. CTA NECK Aortic arch: Normal appearance of the thoracic arch, normal branch pattern. The origins of the innominate, left Common carotid artery and subclavian artery are widely patent. Right carotid system: Common carotid artery is widely patent, coursing in a straight line fashion. Normal appearance of the carotid bifurcation without hemodynamically significant stenosis by NASCET criteria. Normal appearance of the included internal carotid artery. Left carotid system: Common carotid artery is widely patent, coursing in a straight line fashion. Normal appearance of the carotid bifurcation without hemodynamically significant stenosis by NASCET criteria. Normal appearance of the included internal carotid artery. Vertebral arteries:Left vertebral artery is dominant. Normal appearance of the vertebral arteries, which appear widely patent. Skeleton: No acute osseous process though bone windows have not been submitted. Other neck: Soft tissues of the neck are nonacute though, not tailored for  evaluation. Subcentimeter thyroid nodules, below size followup recommendations. CTA HEAD Anterior circulation: Normal appearance of the cervical internal carotid arteries, petrous, cavernous and supra clinoid internal carotid arteries. Widely patent anterior communicating artery. Normal appearance of the anterior and middle cerebral arteries. Posterior circulation: Normal appearance of the vertebral arteries, vertebrobasilar junction and basilar artery, as well as main branch vessels. Normal appearance of the posterior cerebral arteries. No large vessel occlusion, hemodynamically significant stenosis, dissection, luminal irregularity, contrast extravasation or aneurysm within the anterior nor posterior circulation. Asymmetric loss of RIGHT transverse, sigmoid and internal jugular  vein enhancement. IMPRESSION: CT HEAD: Dense RIGHT transverse, sigmoid sinus compatible with acute thrombosis. Otherwise negative CT head. CTA NECK:  Negative. CTA HEAD: Filling defect RIGHT transverse, sigmoid and internal jugular veins consistent with acute dural venous sinus thrombosis. Otherwise negative CTA head. Acute findings discussed with and reconfirmed by Dr.CORY [facility] on 09/09/2015 at 2:25 am. Electronically Signed   By: Awilda Metro M.D.   On: 09/09/2015 02:27   Ct Angio Neck W/cm &/or Wo/cm  09/09/2015  CLINICAL DATA:  RIGHT-sided neck pain radiating to head, posterior orbital pain for 4 days. EXAM: CT ANGIOGRAPHY HEAD AND NECK TECHNIQUE: Multidetector CT imaging of the head and neck was performed using the standard protocol during bolus administration of intravenous contrast. Multiplanar CT image reconstructions and MIPs were obtained to evaluate the vascular anatomy. Carotid stenosis measurements (when applicable) are obtained utilizing NASCET criteria, using the distal internal carotid diameter as the denominator. CONTRAST:  OMNIPAQUE IOHEXOL 350 MG/ML SOLN COMPARISON:  None. FINDINGS: CT HEAD The ventricles  and sulci are normal. No intraparenchymal hemorrhage, mass effect nor midline shift. No acute large vascular territory infarcts. No abnormal extra-axial fluid collections. Asymmetric density RIGHT transverse, sigmoid sinus. Equivocal Basal cisterns are patent. No skull fracture. The included ocular globes and orbital contents are non-suspicious. The mastoid aircells and included paranasal sinuses are well-aerated. CTA NECK Aortic arch: Normal appearance of the thoracic arch, normal branch pattern. The origins of the innominate, left Common carotid artery and subclavian artery are widely patent. Right carotid system: Common carotid artery is widely patent, coursing in a straight line fashion. Normal appearance of the carotid bifurcation without hemodynamically significant stenosis by NASCET criteria. Normal appearance of the included internal carotid artery. Left carotid system: Common carotid artery is widely patent, coursing in a straight line fashion. Normal appearance of the carotid bifurcation without hemodynamically significant stenosis by NASCET criteria. Normal appearance of the included internal carotid artery. Vertebral arteries:Left vertebral artery is dominant. Normal appearance of the vertebral arteries, which appear widely patent. Skeleton: No acute osseous process though bone windows have not been submitted. Other neck: Soft tissues of the neck are nonacute though, not tailored for evaluation. Subcentimeter thyroid nodules, below size followup recommendations. CTA HEAD Anterior circulation: Normal appearance of the cervical internal carotid arteries, petrous, cavernous and supra clinoid internal carotid arteries. Widely patent anterior communicating artery. Normal appearance of the anterior and middle cerebral arteries. Posterior circulation: Normal appearance of the vertebral arteries, vertebrobasilar junction and basilar artery, as well as main branch vessels. Normal appearance of the posterior  cerebral arteries. No large vessel occlusion, hemodynamically significant stenosis, dissection, luminal irregularity, contrast extravasation or aneurysm within the anterior nor posterior circulation. Asymmetric loss of RIGHT transverse, sigmoid and internal jugular vein enhancement. IMPRESSION: CT HEAD: Dense RIGHT transverse, sigmoid sinus compatible with acute thrombosis. Otherwise negative CT head. CTA NECK:  Negative. CTA HEAD: Filling defect RIGHT transverse, sigmoid and internal jugular veins consistent with acute dural venous sinus thrombosis. Otherwise negative CTA head. Acute findings discussed with and reconfirmed by Dr.CORY [facility] on 09/09/2015 at 2:25 am. Electronically Signed   By: Awilda Metro M.D.   On: 09/09/2015 02:27     CBC  Recent Labs Lab 09/09/15 0058 09/10/15 0439  WBC 8.3 5.6  HGB 14.9 12.3  HCT 44.2 37.0  PLT 192 150  MCV 89.3 89.1  MCH 30.1 29.7  MCHC 33.7 33.3  RDW 14.3 14.2  LYMPHSABS 1.5  --   MONOABS 0.4  --   EOSABS 0.0  --  BASOSABS 0.0  --     Chemistries   Recent Labs Lab 09/09/15 0058  NA 139  K 3.7  CL 107  CO2 23  GLUCOSE 89  BUN 9  CREATININE 0.77  CALCIUM 8.8*   ------------------------------------------------------------------------------------------------------------------ estimated creatinine clearance is 116.1 mL/min (by C-G formula based on Cr of 0.77). ------------------------------------------------------------------------------------------------------------------  Recent Labs  09/09/15 0058  HGBA1C 4.8   ------------------------------------------------------------------------------------------------------------------ No results for input(s): CHOL, HDL, LDLCALC, TRIG, CHOLHDL, LDLDIRECT in the last 72 hours. ------------------------------------------------------------------------------------------------------------------  Recent Labs  09/09/15 0058  TSH 3.961    ------------------------------------------------------------------------------------------------------------------ No results for input(s): VITAMINB12, FOLATE, FERRITIN, TIBC, IRON, RETICCTPCT in the last 72 hours.  Coagulation profile  Recent Labs Lab 09/09/15 0058  INR 1.12    No results for input(s): DDIMER in the last 72 hours.  Cardiac Enzymes No results for input(s): CKMB, TROPONINI, MYOGLOBIN in the last 168 hours.  Invalid input(s): CK ------------------------------------------------------------------------------------------------------------------ Invalid input(s): POCBNP    Assessment & Plan   This is a 25 year old female admitted for dural venous thrombus. 1. Dural venous thrombosis: She needs to have headache. At this point I will go ahead and add Motrin to her regimen as well as add Percocet orally to her therapy. Patient on on birth control pills. And a family history of having clots. Hypercoagulable workup pending Neurology recommends heparin and then repeating imaging midweek.  2. Obesity: BMI is 34.8; encouraged healthy diet and exercise 3. DVT prophylaxis: As above 4. GI prophylaxis: None The patient is a full code.       Code Status Orders        Start     Ordered   09/09/15 0981  Full code   Continuous     09/09/15 0610    Code Status History    Date Active Date Inactive Code Status Order ID Comments User Context   This patient has a current code status but no historical code status.           Consults  neurology DVT Prophylaxis  heparin  Lab Results  Component Value Date   PLT 150 09/10/2015     Time Spent in minutes   35 minutes minutes  Greater than 50% of time spent in care coordination and counseling patient regarding the condition and plan of care. Patient and her family at bedside they understand the situation   Auburn Bilberry M.D on 09/10/2015 at 12:03 PM  Between 7am to 6pm - Pager - [phone]  After 6pm go  to [email or web address] - password [email or web address]  [facility]   Office  [phone]

## 2015-09-10 NOTE — Progress Notes (Signed)
Received report from kobie at 15:00, pt is alert and orietned, c/o headache improved with tylenol, iv fluids and iv heparin infusing. Mother at bedside, uneventful shift.

## 2015-09-10 NOTE — Progress Notes (Signed)
ANTICOAGULATION CONSULT NOTE - Initial Consult  Pharmacy Consult for Heparin Indication: stroke  No Known Allergies  Patient Measurements: Height:  (157.5 cm) Weight: 199 lb 8 oz (90.493 kg) IBW/kg (Calculated) : 50.1 Heparin Dosing Weight: 71 kg  Vital Signs: Temp: 99 F (37.2 C) (02/25 2156) Temp Source: Oral (02/25 2156) BP: 114/70 mmHg (02/25 2156) Pulse Rate: 84 (02/25 2156)  Labs:  Recent Labs  09/09/15 0058 09/09/15 2130 09/10/15 0439  HGB 14.9  --  12.3  HCT 44.2  --  37.0  PLT 192  --  150  APTT 31  --   --   LABPROT 14.6  --   --   INR 1.12  --   --   HEPARINUNFRC  --  0.58 0.12*  CREATININE 0.77  --   --     Estimated Creatinine Clearance: 113.5 mL/min (by C-G formula based on Cr of 0.77).   Medical History: History reviewed. No pertinent past medical history.  Medications:  Scheduled:  . calcium carbonate  1 tablet Oral BID WC  . docusate sodium  100 mg Oral BID  . sodium chloride flush  3 mL Intravenous Q12H   Infusions:  . sodium chloride 150 mL/hr at 09/10/15 0309  . heparin 750 Units/hr (09/09/15 2214)    Assessment: 24 y/o F with dural venous thrombosis. Patient received Lovenox 1 mg/kg once this AM.  Goal of Therapy:  Heparin level 0.3- 0.5 units/ml Monitor platelets by anticoagulation protocol: Yes   Plan:  Will treat as CVA with no bolus and lower HL goal. Will begin heparin drip 12 hours after Lovenox dose at 850 units/hr. Will check a HL 6 hours after starting infusion.   2/25 2130 HL 0.58. HL slightly high with lower goal. Will decrease rate to 750 units/hr. Recheck in 6 hours.  2/26 AM heparin level 0.12. Increase rate to 800 units/hr. Increasing cautiously due to large difference in aPTT with last adjustment. Recheck in 6 hours.  Maria Brooks S 09/10/2015,5:36 AM

## 2015-09-10 NOTE — Consult Note (Signed)
CC: headache  HPI: Maria Brooks is an 25 y.o. female with family history of clotting d/o admitted with 3 day history of throbbing HA radiating to her neck.  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  HA has improved. Pt is on Lovenox.    History reviewed. No pertinent past medical history.  Past Surgical History  Procedure Laterality Date  . None      Family History  Problem Relation Age of Onset  . Lung cancer Mother     deceased  . Clotting disorder Mother     reportedly present before developing lung cancer    Social History:  reports that she has never smoked. She does not have any smokeless tobacco history on file. She reports that she does not drink alcohol or use illicit drugs.  No Known Allergies  Medications: I have reviewed the patient's current medications.  ROS: History obtained from the patient  General ROS: negative for - chills, fatigue, fever, night sweats, weight gain or weight loss Psychological ROS: negative for - behavioral disorder, hallucinations, memory difficulties, mood swings or suicidal ideation Ophthalmic ROS: negative for - blurry vision, double vision, eye pain or loss of vision ENT ROS: negative for - epistaxis, nasal discharge, oral lesions, sore throat, tinnitus or vertigo Allergy and Immunology ROS: negative for - hives or itchy/watery eyes Hematological and Lymphatic ROS: negative for - bleeding problems, bruising or swollen lymph nodes Endocrine ROS: negative for - galactorrhea, hair pattern changes, polydipsia/polyuria or temperature intolerance Respiratory ROS: negative for - cough, hemoptysis, shortness of breath or wheezing Cardiovascular ROS: negative for - chest pain, dyspnea on exertion, edema or irregular heartbeat Gastrointestinal ROS: negative for - abdominal pain, diarrhea, hematemesis, nausea/vomiting or stool incontinence Genito-Urinary ROS: negative for - dysuria, hematuria,  incontinence or urinary frequency/urgency Musculoskeletal ROS: negative for - joint swelling or muscular weakness Neurological ROS: as noted in HPI Dermatological ROS: negative for rash and skin lesion changes  Physical Examination: Blood pressure 97/66, pulse 71, temperature 97.9 F (36.6 C), temperature source Oral, resp. rate 18, height 5\' 2"  (1.575 m), weight 208 lb (94.348 kg), last menstrual period 08/25/2015, SpO2 93 %.   Neurological Examination Mental Status: Alert, oriented, thought content appropriate.  Speech fluent without evidence of aphasia.  Able to follow 3 step commands without difficulty. Cranial Nerves: II: Discs flat bilaterally; Visual fields grossly normal, pupils equal, round, reactive to light and accommodation III,IV, VI: ptosis not present, extra-ocular motions intact bilaterally V,VII: smile symmetric, facial light touch sensation normal bilaterally VIII: hearing normal bilaterally IX,X: gag reflex present XI: bilateral shoulder shrug XII: midline tongue extension Motor: Right : Upper extremity   5/5    Left:     Upper extremity   5/5  Lower extremity   5/5     Lower extremity   5/5 Tone and bulk:normal tone throughout; no atrophy noted Sensory: Pinprick and light touch intact throughout, bilaterally Deep Tendon Reflexes: 2+ and symmetric throughout Plantars: Right: downgoing   Left: downgoing Cerebellar: normal finger-to-nose, normal rapid alternating movements and normal heel-to-shin test Gait: not tested       Laboratory Studies:   Basic Metabolic Panel:  Recent Labs Lab 09/09/15 0058  NA 139  K 3.7  CL 107  CO2 23  GLUCOSE 89  BUN 9  CREATININE 0.77  CALCIUM 8.8*    Liver Function Tests: No results for input(s): AST, ALT, ALKPHOS, BILITOT, PROT, ALBUMIN in the last 168 hours.  No results for input(s): LIPASE, AMYLASE in the last 168 hours. No results for input(s): AMMONIA in the last 168 hours.  CBC:  Recent Labs Lab  09/09/15 0058 09/10/15 0439  WBC 8.3 5.6  NEUTROABS 6.3  --   HGB 14.9 12.3  HCT 44.2 37.0  MCV 89.3 89.1  PLT 192 150    Cardiac Enzymes: No results for input(s): CKTOTAL, CKMB, CKMBINDEX, TROPONINI in the last 168 hours.  BNP: Invalid input(s): POCBNP  CBG: No results for input(s): GLUCAP in the last 168 hours.  Microbiology: No results found for this or any previous visit.  Coagulation Studies:  Recent Labs  09/09/15 0058  LABPROT 14.6  INR 1.12    Urinalysis: No results for input(s): COLORURINE, LABSPEC, PHURINE, GLUCOSEU, HGBUR, BILIRUBINUR, KETONESUR, PROTEINUR, UROBILINOGEN, NITRITE, LEUKOCYTESUR in the last 168 hours.  Invalid input(s): APPERANCEUR  Lipid Panel:  No results found for: CHOL, TRIG, HDL, CHOLHDL, VLDL, LDLCALC  HgbA1C:  Lab Results  Component Value Date   HGBA1C 4.8 09/09/2015    Urine Drug Screen:  No results found for: LABOPIA, COCAINSCRNUR, LABBENZ, AMPHETMU, THCU, LABBARB  Alcohol Level: No results for input(s): ETH in the last 168 hours.  Other results: EKG: normal EKG, normal sinus rhythm, unchanged from previous tracings.  Imaging: Ct Angio Head W/cm &/or Wo Cm  09/09/2015  CLINICAL DATA:  RIGHT-sided neck pain radiating to head, posterior orbital pain for 4 days. EXAM: CT ANGIOGRAPHY HEAD AND NECK TECHNIQUE: Multidetector CT imaging of the head and neck was performed using the standard protocol during bolus administration of intravenous contrast. Multiplanar CT image reconstructions and MIPs were obtained to evaluate the vascular anatomy. Carotid stenosis measurements (when applicable) are obtained utilizing NASCET criteria, using the distal internal carotid diameter as the denominator. CONTRAST:  OMNIPAQUE IOHEXOL 350 MG/ML SOLN COMPARISON:  None. FINDINGS: CT HEAD The ventricles and sulci are normal. No intraparenchymal hemorrhage, mass effect nor midline shift. No acute large vascular territory infarcts. No abnormal  extra-axial fluid collections. Asymmetric density RIGHT transverse, sigmoid sinus. Equivocal Basal cisterns are patent. No skull fracture. The included ocular globes and orbital contents are non-suspicious. The mastoid aircells and included paranasal sinuses are well-aerated. CTA NECK Aortic arch: Normal appearance of the thoracic arch, normal branch pattern. The origins of the innominate, left Common carotid artery and subclavian artery are widely patent. Right carotid system: Common carotid artery is widely patent, coursing in a straight line fashion. Normal appearance of the carotid bifurcation without hemodynamically significant stenosis by NASCET criteria. Normal appearance of the included internal carotid artery. Left carotid system: Common carotid artery is widely patent, coursing in a straight line fashion. Normal appearance of the carotid bifurcation without hemodynamically significant stenosis by NASCET criteria. Normal appearance of the included internal carotid artery. Vertebral arteries:Left vertebral artery is dominant. Normal appearance of the vertebral arteries, which appear widely patent. Skeleton: No acute osseous process though bone windows have not been submitted. Other neck: Soft tissues of the neck are nonacute though, not tailored for evaluation. Subcentimeter thyroid nodules, below size followup recommendations. CTA HEAD Anterior circulation: Normal appearance of the cervical internal carotid arteries, petrous, cavernous and supra clinoid internal carotid arteries. Widely patent anterior communicating artery. Normal appearance of the anterior and middle cerebral arteries. Posterior circulation: Normal appearance of the vertebral arteries, vertebrobasilar junction and basilar artery, as well as main branch vessels. Normal appearance of the posterior cerebral arteries. No large vessel occlusion, hemodynamically significant stenosis, dissection, luminal irregularity, contrast extravasation or  aneurysm  within the anterior nor posterior circulation. Asymmetric loss of RIGHT transverse, sigmoid and internal jugular vein enhancement. IMPRESSION: CT HEAD: Dense RIGHT transverse, sigmoid sinus compatible with acute thrombosis. Otherwise negative CT head. CTA NECK:  Negative. CTA HEAD: Filling defect RIGHT transverse, sigmoid and internal jugular veins consistent with acute dural venous sinus thrombosis. Otherwise negative CTA head. Acute findings discussed with and reconfirmed by Dr.CORY [facility] on 09/09/2015 at 2:25 am. Electronically Signed   By: Awilda Metro M.D.   On: 09/09/2015 02:27   Ct Angio Neck W/cm &/or Wo/cm  09/09/2015  CLINICAL DATA:  RIGHT-sided neck pain radiating to head, posterior orbital pain for 4 days. EXAM: CT ANGIOGRAPHY HEAD AND NECK TECHNIQUE: Multidetector CT imaging of the head and neck was performed using the standard protocol during bolus administration of intravenous contrast. Multiplanar CT image reconstructions and MIPs were obtained to evaluate the vascular anatomy. Carotid stenosis measurements (when applicable) are obtained utilizing NASCET criteria, using the distal internal carotid diameter as the denominator. CONTRAST:  OMNIPAQUE IOHEXOL 350 MG/ML SOLN COMPARISON:  None. FINDINGS: CT HEAD The ventricles and sulci are normal. No intraparenchymal hemorrhage, mass effect nor midline shift. No acute large vascular territory infarcts. No abnormal extra-axial fluid collections. Asymmetric density RIGHT transverse, sigmoid sinus. Equivocal Basal cisterns are patent. No skull fracture. The included ocular globes and orbital contents are non-suspicious. The mastoid aircells and included paranasal sinuses are well-aerated. CTA NECK Aortic arch: Normal appearance of the thoracic arch, normal branch pattern. The origins of the innominate, left Common carotid artery and subclavian artery are widely patent. Right carotid system: Common carotid artery is widely patent,  coursing in a straight line fashion. Normal appearance of the carotid bifurcation without hemodynamically significant stenosis by NASCET criteria. Normal appearance of the included internal carotid artery. Left carotid system: Common carotid artery is widely patent, coursing in a straight line fashion. Normal appearance of the carotid bifurcation without hemodynamically significant stenosis by NASCET criteria. Normal appearance of the included internal carotid artery. Vertebral arteries:Left vertebral artery is dominant. Normal appearance of the vertebral arteries, which appear widely patent. Skeleton: No acute osseous process though bone windows have not been submitted. Other neck: Soft tissues of the neck are nonacute though, not tailored for evaluation. Subcentimeter thyroid nodules, below size followup recommendations. CTA HEAD Anterior circulation: Normal appearance of the cervical internal carotid arteries, petrous, cavernous and supra clinoid internal carotid arteries. Widely patent anterior communicating artery. Normal appearance of the anterior and middle cerebral arteries. Posterior circulation: Normal appearance of the vertebral arteries, vertebrobasilar junction and basilar artery, as well as main branch vessels. Normal appearance of the posterior cerebral arteries. No large vessel occlusion, hemodynamically significant stenosis, dissection, luminal irregularity, contrast extravasation or aneurysm within the anterior nor posterior circulation. Asymmetric loss of RIGHT transverse, sigmoid and internal jugular vein enhancement. IMPRESSION: CT HEAD: Dense RIGHT transverse, sigmoid sinus compatible with acute thrombosis. Otherwise negative CT head. CTA NECK:  Negative. CTA HEAD: Filling defect RIGHT transverse, sigmoid and internal jugular veins consistent with acute dural venous sinus thrombosis. Otherwise negative CTA head. Acute findings discussed with and reconfirmed by Dr.CORY [facility] on 09/09/2015 at  2:25 am. Electronically Signed   By: Awilda Metro M.D.   On: 09/09/2015 02:27     Assessment/Plan:  26 y.o. female with family history of clotting d/o admitted with 3 day history of throbbing HA radiating to her neck.  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  HA has  improved but is taking morphine.  - norco started 5/325 q4 PRN to try to limit morphine - heparin gtt.  - hypercoagulable panel sent yesterday - con't aggressive hydration   - repeat CTH and CTA/CTV middle of next week.    Pauletta Browns   09/10/2015, 1:05 PM

## 2015-09-10 NOTE — Progress Notes (Signed)
ANTICOAGULATION CONSULT NOTE - Initial Consult  Pharmacy Consult for Heparin Indication: stroke  No Known Allergies  Patient Measurements: Height:  (157.5 cm) Weight: 208 lb (94.348 kg) IBW/kg (Calculated) : 50.1 Heparin Dosing Weight: 71 kg  Vital Signs: Temp: 97.9 F (36.6 C) (02/26 0619) Temp Source: Oral (02/26 0619) BP: 97/66 mmHg (02/26 0619) Pulse Rate: 71 (02/26 0619)  Labs:  Recent Labs  09/09/15 0058 09/09/15 2130 09/10/15 0439 09/10/15 1200  HGB 14.9  --  12.3  --   HCT 44.2  --  37.0  --   PLT 192  --  150  --   APTT 31  --   --   --   LABPROT 14.6  --   --   --   INR 1.12  --   --   --   HEPARINUNFRC  --  0.58 0.12* 0.15*  CREATININE 0.77  --   --   --     Estimated Creatinine Clearance: 116.1 mL/min (by C-G formula based on Cr of 0.77).   Medical History: History reviewed. No pertinent past medical history.  Medications:  Scheduled:  . calcium carbonate  1 tablet Oral BID WC  . docusate sodium  100 mg Oral BID  . sodium chloride flush  3 mL Intravenous Q12H   Infusions:  . sodium chloride 150 mL/hr at 09/10/15 0920  . heparin 800 Units/hr (09/10/15 1610)    Assessment: 25 y/o F with dural venous thrombosis. Patient received Lovenox 1 mg/kg once this AM.  Goal of Therapy:  Heparin level 0.3- 0.5 units/ml Monitor platelets by anticoagulation protocol: Yes   Plan:  Will treat as CVA with no bolus and lower HL goal.   Heparin level remains at goal. Will increase heparin infusion to 1000 units/hr and recheck a HL in 6 hours.   Luisa Hart D 09/10/2015,12:45 PM

## 2015-09-11 LAB — CBC
HEMATOCRIT: 39.5 % (ref 35.0–47.0)
HEMOGLOBIN: 13.4 g/dL (ref 12.0–16.0)
MCH: 30.4 pg (ref 26.0–34.0)
MCHC: 33.9 g/dL (ref 32.0–36.0)
MCV: 89.7 fL (ref 80.0–100.0)
Platelets: 150 10*3/uL (ref 150–440)
RBC: 4.4 MIL/uL (ref 3.80–5.20)
RDW: 14.2 % (ref 11.5–14.5)
WBC: 4.8 10*3/uL (ref 3.6–11.0)

## 2015-09-11 LAB — HEPARIN LEVEL (UNFRACTIONATED)
HEPARIN UNFRACTIONATED: 0.5 [IU]/mL (ref 0.30–0.70)
HEPARIN UNFRACTIONATED: 0.37 [IU]/mL (ref 0.30–0.70)

## 2015-09-11 MED ORDER — HEPARIN BOLUS VIA INFUSION
2100.0000 [IU] | Freq: Once | INTRAVENOUS | Status: AC
  Administered 2015-09-11: 01:00:00 2100 [IU] via INTRAVENOUS
  Filled 2015-09-11: qty 2100

## 2015-09-11 NOTE — Progress Notes (Signed)
[facility] - [place] at [facility]                                                                                                                                                                                            Patient Demographics   Maria Brooks, is a 25 y.o. female, DOB - Feb 21, 1991, ZOX:[id number]  Admit date - 09/08/2015   Admitting Physician Arnaldo Natal, MD  Outpatient Primary MD for the patient is No PCP Per Patient   LOS - 2  Subjective: Patient's headache is better today. She otherwise denies any other complaints.    Review of Systems:   CONSTITUTIONAL: No documented fever. No fatigue, weakness. No weight gain, no weight loss.  EYES: No blurry or double vision.  ENT: No tinnitus. No postnasal drip. No redness of the oropharynx.  RESPIRATORY: No cough, no wheeze, no hemoptysis. No dyspnea.  CARDIOVASCULAR: No chest pain. No orthopnea. No palpitations. No syncope.  GASTROINTESTINAL: No nausea, no vomiting or diarrhea. No abdominal pain. No melena or hematochezia.  GENITOURINARY: No dysuria or hematuria.  ENDOCRINE: No polyuria or nocturia. No heat or cold intolerance.  HEMATOLOGY: No anemia. No bruising. No bleeding.  INTEGUMENTARY: No rashes. No lesions.  MUSCULOSKELETAL: No arthritis. No swelling. No gout.  NEUROLOGIC: No numbness, tingling, or ataxia. No seizure-type activity. Persistent headache PSYCHIATRIC: No anxiety. No insomnia. No ADD.    Vitals:   Filed Vitals:   09/10/15 0619 09/10/15 1349 09/10/15 2103 09/11/15 0457  BP: 97/66 125/82 100/51 98/47  Pulse: 71 88 70 69  Temp: 97.9 F (36.6 C) 98.2 F (36.8 C) 98.5 F (36.9 C) 98.3 F (36.8 C)  TempSrc: Oral Oral Oral Oral  Resp: 18 18 16 18   Height: 5\' 2"  (1.575 m)     Weight: 94.348 kg (208 lb)   93.486 kg (206 lb 1.6 oz)  SpO2: 93% 98% 95% 99%    Wt Readings from Last 3 Encounters:  09/11/15 93.486 kg (206 lb 1.6 oz)  09/27/14 87.998 kg (194 lb)      Intake/Output Summary (Last 24 hours) at 09/11/15 1137 Last data filed at 09/11/15 0800  Gross per 24 hour  Intake    120 ml  Output      0 ml  Net    120 ml    Physical Exam:   GENERAL: Pleasant-appearing in no apparent distress.  HEAD, EYES, EARS, NOSE AND THROAT: Atraumatic, normocephalic. Extraocular muscles are intact. Pupils equal and reactive to light. Sclerae anicteric. No conjunctival injection. No oro-pharyngeal erythema.  NECK: Supple. There is no jugular venous distention. No bruits, no lymphadenopathy,  no thyromegaly.  HEART: Regular rate and rhythm,. No murmurs, no rubs, no clicks.  LUNGS: Clear to auscultation bilaterally. No rales or rhonchi. No wheezes.  ABDOMEN: Soft, flat, nontender, nondistended. Has good bowel sounds. No hepatosplenomegaly appreciated.  EXTREMITIES: No evidence of any cyanosis, clubbing, or peripheral edema.  +2 pedal and radial pulses bilaterally.  NEUROLOGIC: The patient is alert, awake, and oriented x3 with no focal motor or sensory deficits appreciated bilaterally.  SKIN: Moist and warm with no rashes appreciated.  Psych: Not anxious, depressed LN: No inguinal LN enlargement    Antibiotics   Anti-infectives    None      Medications   Scheduled Meds: . calcium carbonate  1 tablet Oral BID WC  . docusate sodium  100 mg Oral BID  . sodium chloride flush  3 mL Intravenous Q12H   Continuous Infusions: . sodium chloride 150 mL/hr at 09/10/15 2035  . heparin 1,250 Units/hr (09/11/15 0038)   PRN Meds:.acetaminophen **OR** acetaminophen, HYDROcodone-acetaminophen, ibuprofen, morphine injection, ondansetron **OR** ondansetron (ZOFRAN) IV, oxyCODONE-acetaminophen   Data Review:   Micro Results No results found for this or any previous visit (from the past 240 hour(s)).  Radiology Reports Ct Angio Head W/cm &/or Wo Cm  09/09/2015  CLINICAL DATA:  RIGHT-sided neck pain radiating to head, posterior orbital pain for 4 days. EXAM:  CT ANGIOGRAPHY HEAD AND NECK TECHNIQUE: Multidetector CT imaging of the head and neck was performed using the standard protocol during bolus administration of intravenous contrast. Multiplanar CT image reconstructions and MIPs were obtained to evaluate the vascular anatomy. Carotid stenosis measurements (when applicable) are obtained utilizing NASCET criteria, using the distal internal carotid diameter as the denominator. CONTRAST:  OMNIPAQUE IOHEXOL 350 MG/ML SOLN COMPARISON:  None. FINDINGS: CT HEAD The ventricles and sulci are normal. No intraparenchymal hemorrhage, mass effect nor midline shift. No acute large vascular territory infarcts. No abnormal extra-axial fluid collections. Asymmetric density RIGHT transverse, sigmoid sinus. Equivocal Basal cisterns are patent. No skull fracture. The included ocular globes and orbital contents are non-suspicious. The mastoid aircells and included paranasal sinuses are well-aerated. CTA NECK Aortic arch: Normal appearance of the thoracic arch, normal branch pattern. The origins of the innominate, left Common carotid artery and subclavian artery are widely patent. Right carotid system: Common carotid artery is widely patent, coursing in a straight line fashion. Normal appearance of the carotid bifurcation without hemodynamically significant stenosis by NASCET criteria. Normal appearance of the included internal carotid artery. Left carotid system: Common carotid artery is widely patent, coursing in a straight line fashion. Normal appearance of the carotid bifurcation without hemodynamically significant stenosis by NASCET criteria. Normal appearance of the included internal carotid artery. Vertebral arteries:Left vertebral artery is dominant. Normal appearance of the vertebral arteries, which appear widely patent. Skeleton: No acute osseous process though bone windows have not been submitted. Other neck: Soft tissues of the neck are nonacute though, not tailored for  evaluation. Subcentimeter thyroid nodules, below size followup recommendations. CTA HEAD Anterior circulation: Normal appearance of the cervical internal carotid arteries, petrous, cavernous and supra clinoid internal carotid arteries. Widely patent anterior communicating artery. Normal appearance of the anterior and middle cerebral arteries. Posterior circulation: Normal appearance of the vertebral arteries, vertebrobasilar junction and basilar artery, as well as main branch vessels. Normal appearance of the posterior cerebral arteries. No large vessel occlusion, hemodynamically significant stenosis, dissection, luminal irregularity, contrast extravasation or aneurysm within the anterior nor posterior circulation. Asymmetric loss of RIGHT transverse, sigmoid and internal jugular vein  enhancement. IMPRESSION: CT HEAD: Dense RIGHT transverse, sigmoid sinus compatible with acute thrombosis. Otherwise negative CT head. CTA NECK:  Negative. CTA HEAD: Filling defect RIGHT transverse, sigmoid and internal jugular veins consistent with acute dural venous sinus thrombosis. Otherwise negative CTA head. Acute findings discussed with and reconfirmed by Dr.CORY [facility] on 09/09/2015 at 2:25 am. Electronically Signed   By: Awilda Metro M.D.   On: 09/09/2015 02:27   Ct Angio Neck W/cm &/or Wo/cm  09/09/2015  CLINICAL DATA:  RIGHT-sided neck pain radiating to head, posterior orbital pain for 4 days. EXAM: CT ANGIOGRAPHY HEAD AND NECK TECHNIQUE: Multidetector CT imaging of the head and neck was performed using the standard protocol during bolus administration of intravenous contrast. Multiplanar CT image reconstructions and MIPs were obtained to evaluate the vascular anatomy. Carotid stenosis measurements (when applicable) are obtained utilizing NASCET criteria, using the distal internal carotid diameter as the denominator. CONTRAST:  OMNIPAQUE IOHEXOL 350 MG/ML SOLN COMPARISON:  None. FINDINGS: CT HEAD The ventricles  and sulci are normal. No intraparenchymal hemorrhage, mass effect nor midline shift. No acute large vascular territory infarcts. No abnormal extra-axial fluid collections. Asymmetric density RIGHT transverse, sigmoid sinus. Equivocal Basal cisterns are patent. No skull fracture. The included ocular globes and orbital contents are non-suspicious. The mastoid aircells and included paranasal sinuses are well-aerated. CTA NECK Aortic arch: Normal appearance of the thoracic arch, normal branch pattern. The origins of the innominate, left Common carotid artery and subclavian artery are widely patent. Right carotid system: Common carotid artery is widely patent, coursing in a straight line fashion. Normal appearance of the carotid bifurcation without hemodynamically significant stenosis by NASCET criteria. Normal appearance of the included internal carotid artery. Left carotid system: Common carotid artery is widely patent, coursing in a straight line fashion. Normal appearance of the carotid bifurcation without hemodynamically significant stenosis by NASCET criteria. Normal appearance of the included internal carotid artery. Vertebral arteries:Left vertebral artery is dominant. Normal appearance of the vertebral arteries, which appear widely patent. Skeleton: No acute osseous process though bone windows have not been submitted. Other neck: Soft tissues of the neck are nonacute though, not tailored for evaluation. Subcentimeter thyroid nodules, below size followup recommendations. CTA HEAD Anterior circulation: Normal appearance of the cervical internal carotid arteries, petrous, cavernous and supra clinoid internal carotid arteries. Widely patent anterior communicating artery. Normal appearance of the anterior and middle cerebral arteries. Posterior circulation: Normal appearance of the vertebral arteries, vertebrobasilar junction and basilar artery, as well as main branch vessels. Normal appearance of the posterior  cerebral arteries. No large vessel occlusion, hemodynamically significant stenosis, dissection, luminal irregularity, contrast extravasation or aneurysm within the anterior nor posterior circulation. Asymmetric loss of RIGHT transverse, sigmoid and internal jugular vein enhancement. IMPRESSION: CT HEAD: Dense RIGHT transverse, sigmoid sinus compatible with acute thrombosis. Otherwise negative CT head. CTA NECK:  Negative. CTA HEAD: Filling defect RIGHT transverse, sigmoid and internal jugular veins consistent with acute dural venous sinus thrombosis. Otherwise negative CTA head. Acute findings discussed with and reconfirmed by Dr.CORY [facility] on 09/09/2015 at 2:25 am. Electronically Signed   By: Awilda Metro M.D.   On: 09/09/2015 02:27     CBC  Recent Labs Lab 09/09/15 0058 09/10/15 0439 09/11/15 0634  WBC 8.3 5.6 4.8  HGB 14.9 12.3 13.4  HCT 44.2 37.0 39.5  PLT 192 150 150  MCV 89.3 89.1 89.7  MCH 30.1 29.7 30.4  MCHC 33.7 33.3 33.9  RDW 14.3 14.2 14.2  LYMPHSABS 1.5  --   --  MONOABS 0.4  --   --   EOSABS 0.0  --   --   BASOSABS 0.0  --   --     Chemistries   Recent Labs Lab 09/09/15 0058  NA 139  K 3.7  CL 107  CO2 23  GLUCOSE 89  BUN 9  CREATININE 0.77  CALCIUM 8.8*   ------------------------------------------------------------------------------------------------------------------ estimated creatinine clearance is 115.5 mL/min (by C-G formula based on Cr of 0.77). ------------------------------------------------------------------------------------------------------------------  Recent Labs  09/09/15 0058  HGBA1C 4.8   ------------------------------------------------------------------------------------------------------------------ No results for input(s): CHOL, HDL, LDLCALC, TRIG, CHOLHDL, LDLDIRECT in the last 72 hours. ------------------------------------------------------------------------------------------------------------------  Recent Labs   09/09/15 0058  TSH 3.961   ------------------------------------------------------------------------------------------------------------------ No results for input(s): VITAMINB12, FOLATE, FERRITIN, TIBC, IRON, RETICCTPCT in the last 72 hours.  Coagulation profile  Recent Labs Lab 09/09/15 0058  INR 1.12    No results for input(s): DDIMER in the last 72 hours.  Cardiac Enzymes No results for input(s): CKMB, TROPONINI, MYOGLOBIN in the last 168 hours.  Invalid input(s): CK ------------------------------------------------------------------------------------------------------------------ Invalid input(s): POCBNP    Assessment & Plan   This is a 25 year old female admitted for dural venous thrombus. 1. Dural venous thrombosis:  Continue heparin drip, repeat imaging on Wednesday. Anti-thrombin activity was normal. Decrease IV fluid rate 2. Obesity: BMI is 34.8; encouraged healthy diet and exercise 3. DVT prophylaxis: As above 4. GI prophylaxis: None The patient is a full code.       Code Status Orders        Start     Ordered   09/09/15 4540  Full code   Continuous     09/09/15 0610    Code Status History    Date Active Date Inactive Code Status Order ID Comments User Context   This patient has a current code status but no historical code status.           Consults  neurology DVT Prophylaxis  heparin  Lab Results  Component Value Date   PLT 150 09/11/2015     Time Spent in minutes   25 minutes spent Auburn Bilberry M.D on 09/11/2015 at 11:37 AM  Between 7am to 6pm - Pager - [phone]  After 6pm go to [email or web address] - password [email or web address] [facility]  [facility]   Office  [phone]

## 2015-09-11 NOTE — Progress Notes (Signed)
ANTICOAGULATION CONSULT NOTE - Initial Consult  Pharmacy Consult for Heparin Indication: stroke  No Known Allergies  Patient Measurements: Height:  (157.5 cm) Weight: 208 lb (94.348 kg) IBW/kg (Calculated) : 50.1 Heparin Dosing Weight: 71 kg  Vital Signs: Temp: 98.5 F (36.9 C) (02/26 2103) Temp Source: Oral (02/26 2103) BP: 100/51 mmHg (02/26 2103) Pulse Rate: 70 (02/26 2103)  Labs:  Recent Labs  09/09/15 0058  09/10/15 0439 09/10/15 1200 09/10/15 1855 09/10/15 2315  HGB 14.9  --  12.3  --   --   --   HCT 44.2  --  37.0  --   --   --   PLT 192  --  150  --   --   --   APTT 31  --   --   --   --   --   LABPROT 14.6  --   --   --   --   --   INR 1.12  --   --   --   --   --   HEPARINUNFRC  --   < > 0.12* 0.15* 0.16* 0.16*  CREATININE 0.77  --   --   --   --   --   < > = values in this interval not displayed.  Estimated Creatinine Clearance: 116.1 mL/min (by C-G formula based on Cr of 0.77).   Medical History: History reviewed. No pertinent past medical history.  Medications:  Scheduled:  . calcium carbonate  1 tablet Oral BID WC  . docusate sodium  100 mg Oral BID  . heparin  2,100 Units Intravenous Once  . sodium chloride flush  3 mL Intravenous Q12H   Infusions:  . sodium chloride 150 mL/hr at 09/10/15 2035  . heparin 1,000 Units/hr (09/10/15 2035)    Assessment: 25 y/o F with dural venous thrombosis. Patient received Lovenox 1 mg/kg once this AM.  Goal of Therapy:  Heparin level 0.3- 0.5 units/ml Monitor platelets by anticoagulation protocol: Yes   Plan:  Will treat as CVA with no bolus and lower HL goal.   Heparin level remains below goal. Spoke with night shift nurse who states that she was told the drip had been turned off for a little while but there is no documentation of when or for how long. She states that the nurse who took over at 1500 relayed this information to her. Heparin is infusing now and when she took over at 1900. Cannot  rule out that drip was turned off between now and the last dose increase. Therefore I not make any changes and will redraw the lab 2300   2/26 23:00 heparin level repeated at 0.16. 2100 unit bolus and increase to 1250 units/hr. Recheck in 6 hours.   Olene Floss, Pharm.D Clinical Pharmacist   09/11/2015,12:30 AM

## 2015-09-11 NOTE — Progress Notes (Signed)
Subjective: Patient reports that she feels better today.  Less pain.    Objective: Current vital signs: BP 98/47 mmHg  Pulse 69  Temp(Src) 98.3 F (36.8 C) (Oral)  Resp 18  Ht  (1.575 m)  Wt 93.486 kg (206 lb 1.6 oz)  BMI 37.69 kg/m2  SpO2 99%  LMP 08/25/2015 (Approximate) Vital signs in last 24 hours: Temp:  [98.2 F (36.8 C)-98.5 F (36.9 C)] 98.3 F (36.8 C) (02/27 0457) Pulse Rate:  [69-88] 69 (02/27 0457) Resp:  [16-18] 18 (02/27 0457) BP: (98-125)/(47-82) 98/47 mmHg (02/27 0457) SpO2:  [95 %-99 %] 99 % (02/27 0457) Weight:  [93.486 kg (206 lb 1.6 oz)] 93.486 kg (206 lb 1.6 oz) (02/27 0457)  Intake/Output from previous day: 02/26 0701 - 02/27 0700 In: 120 [P.O.:120] Out: -  Intake/Output this shift:   Nutritional status: Diet Heart Room service appropriate?: Yes; Fluid consistency:: Thin  Neurologic Exam: Mental Status: Alert, oriented, thought content appropriate.  Speech fluent without evidence of aphasia.  Able to follow 3 step commands without difficulty. Cranial Nerves: II: Discs flat bilaterally; Visual fields grossly normal, pupils equal, round, reactive to light and accommodation III,IV, VI: ptosis not present, extra-ocular motions intact bilaterally V,VII: smile symmetric, facial light touch sensation normal bilaterally VIII: hearing normal bilaterally IX,X: gag reflex present XI: bilateral shoulder shrug XII: left tongue deviation Motor: Right : Upper extremity   5/5    Left:     Upper extremity   5/5  Lower extremity   5/5     Lower extremity   5/5 Tone and bulk:normal tone throughout; no atrophy noted    Lab Results: Basic Metabolic Panel:  Recent Labs Lab 09/09/15 0058  NA 139  K 3.7  CL 107  CO2 23  GLUCOSE 89  BUN 9  CREATININE 0.77  CALCIUM 8.8*    Liver Function Tests: No results for input(s): AST, ALT, ALKPHOS, BILITOT, PROT, ALBUMIN in the last 168 hours. No results for input(s): LIPASE, AMYLASE in the last 168  hours. No results for input(s): AMMONIA in the last 168 hours.  CBC:  Recent Labs Lab 09/09/15 0058 09/10/15 0439 09/11/15 0634  WBC 8.3 5.6 4.8  NEUTROABS 6.3  --   --   HGB 14.9 12.3 13.4  HCT 44.2 37.0 39.5  MCV 89.3 89.1 89.7  PLT 192 150 150    Cardiac Enzymes: No results for input(s): CKTOTAL, CKMB, CKMBINDEX, TROPONINI in the last 168 hours.  Lipid Panel: No results for input(s): CHOL, TRIG, HDL, CHOLHDL, VLDL, LDLCALC in the last 168 hours.  CBG: No results for input(s): GLUCAP in the last 168 hours.  Microbiology: No results found for this or any previous visit.  Coagulation Studies:  Recent Labs  09/09/15 0058  LABPROT 14.6  INR 1.12    Imaging: No results found.  Medications:  I have reviewed the patient's current medications. Scheduled: . calcium carbonate  1 tablet Oral BID WC  . docusate sodium  100 mg Oral BID  . sodium chloride flush  3 mL Intravenous Q12H    Assessment/Plan: 25 year old with a right transverse sinus thrombosis.  On Heparin.  Norco for pain.  ATIII level normal at 111.  Initial PT/INR normal.  Remaining hypercoag panel is pending.  Unfractionated heparin at .5.    Recommendations: 1.  Continue heparin 2.  Repeat imaging middle of the week.     LOS: 2 days   Thana Farr, MD Neurology [phone] 09/11/2015  10:34 AM

## 2015-09-11 NOTE — Progress Notes (Signed)
ANTICOAGULATION CONSULT NOTE - Initial Consult  Pharmacy Consult for Heparin Indication: stroke  No Known Allergies  Patient Measurements: Height:  (157.5 cm) Weight: 206 lb 1.6 oz (93.486 kg) IBW/kg (Calculated) : 50.1 Heparin Dosing Weight: 71 kg  Vital Signs: Temp: 98.3 F (36.8 C) (02/27 0457) Temp Source: Oral (02/27 0457) BP: 98/47 mmHg (02/27 0457) Pulse Rate: 69 (02/27 0457)  Labs:  Recent Labs  09/09/15 0058  09/10/15 0439  09/10/15 1855 09/10/15 2315 09/11/15 0634  HGB 14.9  --  12.3  --   --   --  13.4  HCT 44.2  --  37.0  --   --   --  39.5  PLT 192  --  150  --   --   --  150  APTT 31  --   --   --   --   --   --   LABPROT 14.6  --   --   --   --   --   --   INR 1.12  --   --   --   --   --   --   HEPARINUNFRC  --   < > 0.12*  < > 0.16* 0.16* 0.50  CREATININE 0.77  --   --   --   --   --   --   < > = values in this interval not displayed.  Estimated Creatinine Clearance: 115.5 mL/min (by C-G formula based on Cr of 0.77).   Medical History: History reviewed. No pertinent past medical history.  Medications:  Scheduled:  . calcium carbonate  1 tablet Oral BID WC  . docusate sodium  100 mg Oral BID  . sodium chloride flush  3 mL Intravenous Q12H   Infusions:  . sodium chloride 150 mL/hr at 09/10/15 2035  . heparin 1,250 Units/hr (09/11/15 0038)    Assessment: 25 y/o F with dural venous thrombosis. Currently on Heparin drip at 1250 units/hr.   2/27 0630 Heparin level resulted at 0.5  Goal of Therapy:  Heparin level 0.3- 0.5 units/ml Monitor platelets by anticoagulation protocol: Yes   Plan:  Will continue with current rate. Will check next Heparin level at 12:30 for confirmation level.    Clovia Cuff, PharmD, BCPS 09/11/2015 9:35 AM

## 2015-09-11 NOTE — Care Management (Signed)
Met with patient to find out what pharmacy has her insurance on file. She states that [facility] in [place] should have her insurance to check price of blood thinner. She has a safe place to go at discharge and transportation to get there. Her OB/GYN is Dr. Elon Alas of [place]. She has no other PCP. I advised patient to follow up with Dr. Annamaria Boots at discharge.

## 2015-09-11 NOTE — Progress Notes (Signed)
ANTICOAGULATION CONSULT NOTE - Initial Consult  Pharmacy Consult for Heparin Indication: stroke  No Known Allergies  Patient Measurements: Height:  (157.5 cm) Weight: 206 lb 1.6 oz (93.486 kg) IBW/kg (Calculated) : 50.1 Heparin Dosing Weight: 71 kg  Vital Signs: Temp: 98.7 F (37.1 C) (02/27 1320) Temp Source: Oral (02/27 1320) BP: 103/84 mmHg (02/27 1320) Pulse Rate: 73 (02/27 1320)  Labs:  Recent Labs  09/09/15 0058  09/10/15 0439  09/10/15 2315 09/11/15 0634 09/11/15 1311  HGB 14.9  --  12.3  --   --  13.4  --   HCT 44.2  --  37.0  --   --  39.5  --   PLT 192  --  150  --   --  150  --   APTT 31  --   --   --   --   --   --   LABPROT 14.6  --   --   --   --   --   --   INR 1.12  --   --   --   --   --   --   HEPARINUNFRC  --   < > 0.12*  < > 0.16* 0.50 0.37  CREATININE 0.77  --   --   --   --   --   --   < > = values in this interval not displayed.  Estimated Creatinine Clearance: 115.5 mL/min (by C-G formula based on Cr of 0.77).   Medical History: History reviewed. No pertinent past medical history.  Medications:  Scheduled:  . calcium carbonate  1 tablet Oral BID WC  . docusate sodium  100 mg Oral BID  . sodium chloride flush  3 mL Intravenous Q12H   Infusions:  . sodium chloride 125 mL/hr at 09/11/15 1146  . heparin 1,250 Units/hr (09/11/15 0038)    Assessment: 25 y/o F with dural venous thrombosis. Currently on Heparin drip at 1250 units/hr.   2/27 0630 Heparin level resulted at 0.5 2/27 1311 Heparin level resulted at 0.37  Goal of Therapy:  Heparin level 0.3- 0.5 units/ml Monitor platelets by anticoagulation protocol: Yes   Plan:  Will continue with current rate. Will check next Heparin level and CBC with am labs.   Clovia Cuff, PharmD, BCPS 09/11/2015 2:52 PM

## 2015-09-12 ENCOUNTER — Inpatient Hospital Stay: Payer: BLUE CROSS/BLUE SHIELD

## 2015-09-12 LAB — CBC
HEMATOCRIT: 41.9 % (ref 35.0–47.0)
HEMOGLOBIN: 13.8 g/dL (ref 12.0–16.0)
MCH: 29.3 pg (ref 26.0–34.0)
MCHC: 33 g/dL (ref 32.0–36.0)
MCV: 88.9 fL (ref 80.0–100.0)
Platelets: 182 10*3/uL (ref 150–440)
RBC: 4.71 MIL/uL (ref 3.80–5.20)
RDW: 14 % (ref 11.5–14.5)
WBC: 5.4 10*3/uL (ref 3.6–11.0)

## 2015-09-12 LAB — HEPARIN LEVEL (UNFRACTIONATED): Heparin Unfractionated: 0.39 IU/mL (ref 0.30–0.70)

## 2015-09-12 MED ORDER — SODIUM CHLORIDE 0.9 % IV SOLN
INTRAVENOUS | Status: DC
  Administered 2015-09-12 – 2015-09-15 (×8): via INTRAVENOUS

## 2015-09-12 MED ORDER — GADOBENATE DIMEGLUMINE 529 MG/ML IV SOLN
20.0000 mL | Freq: Once | INTRAVENOUS | Status: AC | PRN
  Administered 2015-09-12: 14:00:00 19 mL via INTRAVENOUS

## 2015-09-12 NOTE — Progress Notes (Signed)
Subjective: Patient continues to complain of neck pain.  Still better than at admission.  Remains on heparin.    Objective: Current vital signs: BP 103/65 mmHg  Pulse 72  Temp(Src) 97.5 F (36.4 C) (Oral)  Resp 18  Ht  (1.575 m)  Wt 93.123 kg (205 lb 4.8 oz)  BMI 37.54 kg/m2  SpO2 99%  LMP 08/25/2015 (Approximate) Vital signs in last 24 hours: Temp:  [97.5 F (36.4 C)-98.7 F (37.1 C)] 97.5 F (36.4 C) (02/28 0504) Pulse Rate:  [70-96] 72 (02/28 0504) Resp:  [18-20] 18 (02/28 0504) BP: (103-127)/(65-84) 103/65 mmHg (02/28 0504) SpO2:  [98 %-100 %] 99 % (02/28 0504) Weight:  [93.123 kg (205 lb 4.8 oz)] 93.123 kg (205 lb 4.8 oz) (02/28 0500)  Intake/Output from previous day:   Intake/Output this shift:   Nutritional status: Diet Heart Room service appropriate?: Yes; Fluid consistency:: Thin  Neurologic Exam: Mental Status: Alert, oriented, thought content appropriate. Speech fluent without evidence of aphasia. Able to follow 3 step commands without difficulty. Cranial Nerves: II: Discs flat bilaterally; Visual fields grossly normal, pupils equal, round, reactive to light and accommodation III,IV, VI: ptosis not present, extra-ocular motions intact bilaterally V,VII: smile symmetric, facial light touch sensation normal bilaterally VIII: hearing normal bilaterally IX,X: gag reflex present XI: bilateral shoulder shrug XII: left tongue deviation Motor: Right :Upper extremity 5/5Left: Upper extremity 5/5 Lower extremity 5/5Lower extremity 5/5 Tone and bulk:normal tone throughout; no atrophy noted  Lab Results: Basic Metabolic Panel:  Recent Labs Lab 09/09/15 0058  NA 139  K 3.7  CL 107  CO2 23  GLUCOSE 89  BUN 9  CREATININE 0.77  CALCIUM 8.8*    Liver Function Tests: No results for input(s): AST, ALT, ALKPHOS, BILITOT, PROT, ALBUMIN in the  last 168 hours. No results for input(s): LIPASE, AMYLASE in the last 168 hours. No results for input(s): AMMONIA in the last 168 hours.  CBC:  Recent Labs Lab 09/09/15 0058 09/10/15 0439 09/11/15 0634 09/12/15 0515  WBC 8.3 5.6 4.8 5.4  NEUTROABS 6.3  --   --   --   HGB 14.9 12.3 13.4 13.8  HCT 44.2 37.0 39.5 41.9  MCV 89.3 89.1 89.7 88.9  PLT 192 150 150 182    Cardiac Enzymes: No results for input(s): CKTOTAL, CKMB, CKMBINDEX, TROPONINI in the last 168 hours.  Lipid Panel: No results for input(s): CHOL, TRIG, HDL, CHOLHDL, VLDL, LDLCALC in the last 168 hours.  CBG: No results for input(s): GLUCAP in the last 168 hours.  Microbiology: No results found for this or any previous visit.  Coagulation Studies: No results for input(s): LABPROT, INR in the last 72 hours.  Imaging: No results found.  Medications:  I have reviewed the patient's current medications. Scheduled: . calcium carbonate  1 tablet Oral BID WC  . docusate sodium  100 mg Oral BID  . sodium chloride flush  3 mL Intravenous Q12H    Assessment/Plan: Patient remains stable.  No new complaints.  Remains on heparin with pharmacy managing.  Unfractionated heparin 0.39.    Recommendations: 1.  MRA, MRV in AM for follow up of thrombosis. 2.  Continue heparin   LOS: 3 days   Thana Farr, MD Neurology [phone] 09/12/2015  9:58 AM

## 2015-09-12 NOTE — Progress Notes (Signed)
[facility] - [place] [facility]                                                                                                                                                                                            Patient Demographics   Maria Brooks, is a 25 y.o. female, DOB - 1990-12-13, VHQ:[id number]  Admit date - 09/08/2015   Admitting Physician Arnaldo Natal, MD  Outpatient Primary MD for the patient is No PCP Per Patient   LOS - 3  Subjective: Patient had a continues to improve. No other complaints    Review of Systems:   CONSTITUTIONAL: No documented fever. No fatigue, weakness. No weight gain, no weight loss.  EYES: No blurry or double vision.  ENT: No tinnitus. No postnasal drip. No redness of the oropharynx.  RESPIRATORY: No cough, no wheeze, no hemoptysis. No dyspnea.  CARDIOVASCULAR: No chest pain. No orthopnea. No palpitations. No syncope.  GASTROINTESTINAL: No nausea, no vomiting or diarrhea. No abdominal pain. No melena or hematochezia.  GENITOURINARY: No dysuria or hematuria.  ENDOCRINE: No polyuria or nocturia. No heat or cold intolerance.  HEMATOLOGY: No anemia. No bruising. No bleeding.  INTEGUMENTARY: No rashes. No lesions.  MUSCULOSKELETAL: No arthritis. No swelling. No gout.  NEUROLOGIC: No numbness, tingling, or ataxia. No seizure-type activity. Persistent headache PSYCHIATRIC: No anxiety. No insomnia. No ADD.    Vitals:   Filed Vitals:   09/11/15 2130 09/11/15 2225 09/12/15 0500 09/12/15 0504  BP: 115/74 127/79  103/65  Pulse: 70 96  72  Temp: 98.2 F (36.8 C)   97.5 F (36.4 C)  TempSrc: Oral   Oral  Resp: Height:      Weight:   93.123 kg (205 lb 4.8 oz)   SpO2: 100% 98%  99%    Wt Readings from Last 3 Encounters:  09/12/15 93.123 kg (205 lb 4.8 oz)  09/27/14 87.998 kg (194 lb)     Intake/Output Summary (Last 24 hours) at 09/12/15 1349 Last data filed at 09/12/15 0900  Gross per 24 hour   Intake    240 ml  Output      0 ml  Net    240 ml    Physical Exam:   GENERAL: Pleasant-appearing in no apparent distress.  HEAD, EYES, EARS, NOSE AND THROAT: Atraumatic, normocephalic. Extraocular muscles are intact. Pupils equal and reactive to light. Sclerae anicteric. No conjunctival injection. No oro-pharyngeal erythema.  NECK: Supple. There is no jugular venous distention. No bruits, no lymphadenopathy, no thyromegaly.  HEART: Regular rate and rhythm,. No murmurs, no rubs, no clicks.  LUNGS: Clear to auscultation bilaterally. No rales or rhonchi. No wheezes.  ABDOMEN: Soft, flat, nontender, nondistended. Has good bowel sounds. No hepatosplenomegaly appreciated.  EXTREMITIES: No evidence of any cyanosis, clubbing, or peripheral edema.  +2 pedal and radial pulses bilaterally.  NEUROLOGIC: The patient is alert, awake, and oriented x3 with no focal motor or sensory deficits appreciated bilaterally.  SKIN: Moist and warm with no rashes appreciated.  Psych: Not anxious, depressed LN: No inguinal LN enlargement    Antibiotics   Anti-infectives    None      Medications   Scheduled Meds: . calcium carbonate  1 tablet Oral BID WC  . docusate sodium  100 mg Oral BID  . sodium chloride flush  3 mL Intravenous Q12H   Continuous Infusions: . sodium chloride 100 mL/hr at 09/12/15 1316  . heparin 1,250 Units/hr (09/12/15 1316)   PRN Meds:.acetaminophen **OR** acetaminophen, HYDROcodone-acetaminophen, ibuprofen, morphine injection, ondansetron **OR** ondansetron (ZOFRAN) IV, oxyCODONE-acetaminophen   Data Review:   Micro Results No results found for this or any previous visit (from the past 240 hour(s)).  Radiology Reports Ct Angio Head W/cm &/or Wo Cm  09/09/2015  CLINICAL DATA:  RIGHT-sided neck pain radiating to head, posterior orbital pain for 4 days. EXAM: CT ANGIOGRAPHY HEAD AND NECK TECHNIQUE: Multidetector CT imaging of the head and neck was performed using the standard  protocol during bolus administration of intravenous contrast. Multiplanar CT image reconstructions and MIPs were obtained to evaluate the vascular anatomy. Carotid stenosis measurements (when applicable) are obtained utilizing NASCET criteria, using the distal internal carotid diameter as the denominator. CONTRAST:  OMNIPAQUE IOHEXOL 350 MG/ML SOLN COMPARISON:  None. FINDINGS: CT HEAD The ventricles and sulci are normal. No intraparenchymal hemorrhage, mass effect nor midline shift. No acute large vascular territory infarcts. No abnormal extra-axial fluid collections. Asymmetric density RIGHT transverse, sigmoid sinus. Equivocal Basal cisterns are patent. No skull fracture. The included ocular globes and orbital contents are non-suspicious. The mastoid aircells and included paranasal sinuses are well-aerated. CTA NECK Aortic arch: Normal appearance of the thoracic arch, normal branch pattern. The origins of the innominate, left Common carotid artery and subclavian artery are widely patent. Right carotid system: Common carotid artery is widely patent, coursing in a straight line fashion. Normal appearance of the carotid bifurcation without hemodynamically significant stenosis by NASCET criteria. Normal appearance of the included internal carotid artery. Left carotid system: Common carotid artery is widely patent, coursing in a straight line fashion. Normal appearance of the carotid bifurcation without hemodynamically significant stenosis by NASCET criteria. Normal appearance of the included internal carotid artery. Vertebral arteries:Left vertebral artery is dominant. Normal appearance of the vertebral arteries, which appear widely patent. Skeleton: No acute osseous process though bone windows have not been submitted. Other neck: Soft tissues of the neck are nonacute though, not tailored for evaluation. Subcentimeter thyroid nodules, below size followup recommendations. CTA HEAD Anterior circulation: Normal  appearance of the cervical internal carotid arteries, petrous, cavernous and supra clinoid internal carotid arteries. Widely patent anterior communicating artery. Normal appearance of the anterior and middle cerebral arteries. Posterior circulation: Normal appearance of the vertebral arteries, vertebrobasilar junction and basilar artery, as well as main branch vessels. Normal appearance of the posterior cerebral arteries. No large vessel occlusion, hemodynamically significant stenosis, dissection, luminal irregularity, contrast extravasation or aneurysm within the anterior nor posterior circulation. Asymmetric loss of RIGHT transverse, sigmoid and internal jugular vein enhancement. IMPRESSION: CT HEAD: Dense RIGHT transverse, sigmoid sinus compatible with acute thrombosis. Otherwise negative  CT head. CTA NECK:  Negative. CTA HEAD: Filling defect RIGHT transverse, sigmoid and internal jugular veins consistent with acute dural venous sinus thrombosis. Otherwise negative CTA head. Acute findings discussed with and reconfirmed by Dr.C[facility] on 09/09/2015 at 2:25 am. Electronically Signed   By: Awilda Metro M.D.   On: 09/09/2015 02:27   Ct Angio Neck W/cm &/or Wo/cm  09/09/2015  CLINICAL DATA:  RIGHT-sided neck pain radiating to head, posterior orbital pain for 4 days. EXAM: CT ANGIOGRAPHY HEAD AND NECK TECHNIQUE: Multidetector CT imaging of the head and neck was performed using the standard protocol during bolus administration of intravenous contrast. Multiplanar CT image reconstructions and MIPs were obtained to evaluate the vascular anatomy. Carotid stenosis measurements (when applicable) are obtained utilizing NASCET criteria, using the distal internal carotid diameter as the denominator. CONTRAST:  OMNIPAQUE IOHEXOL 350 MG/ML SOLN COMPARISON:  None. FINDINGS: CT HEAD The ventricles and sulci are normal. No intraparenchymal hemorrhage, mass effect nor midline shift. No acute large vascular territory  infarcts. No abnormal extra-axial fluid collections. Asymmetric density RIGHT transverse, sigmoid sinus. Equivocal Basal cisterns are patent. No skull fracture. The included ocular globes and orbital contents are non-suspicious. The mastoid aircells and included paranasal sinuses are well-aerated. CTA NECK Aortic arch: Normal appearance of the thoracic arch, normal branch pattern. The origins of the innominate, left Common carotid artery and subclavian artery are widely patent. Right carotid system: Common carotid artery is widely patent, coursing in a straight line fashion. Normal appearance of the carotid bifurcation without hemodynamically significant stenosis by NASCET criteria. Normal appearance of the included internal carotid artery. Left carotid system: Common carotid artery is widely patent, coursing in a straight line fashion. Normal appearance of the carotid bifurcation without hemodynamically significant stenosis by NASCET criteria. Normal appearance of the included internal carotid artery. Vertebral arteries:Left vertebral artery is dominant. Normal appearance of the vertebral arteries, which appear widely patent. Skeleton: No acute osseous process though bone windows have not been submitted. Other neck: Soft tissues of the neck are nonacute though, not tailored for evaluation. Subcentimeter thyroid nodules, below size followup recommendations. CTA HEAD Anterior circulation: Normal appearance of the cervical internal carotid arteries, petrous, cavernous and supra clinoid internal carotid arteries. Widely patent anterior communicating artery. Normal appearance of the anterior and middle cerebral arteries. Posterior circulation: Normal appearance of the vertebral arteries, vertebrobasilar junction and basilar artery, as well as main branch vessels. Normal appearance of the posterior cerebral arteries. No large vessel occlusion, hemodynamically significant stenosis, dissection, luminal irregularity,  contrast extravasation or aneurysm within the anterior nor posterior circulation. Asymmetric loss of RIGHT transverse, sigmoid and internal jugular vein enhancement. IMPRESSION: CT HEAD: Dense RIGHT transverse, sigmoid sinus compatible with acute thrombosis. Otherwise negative CT head. CTA NECK:  Negative. CTA HEAD: Filling defect RIGHT transverse, sigmoid and internal jugular veins consistent with acute dural venous sinus thrombosis. Otherwise negative CTA head. Acute findings discussed with and reconfirmed by Dr.CORY [facility] on 09/09/2015 at 2:25 am. Electronically Signed   By: Awilda Metro M.D.   On: 09/09/2015 02:27     CBC  Recent Labs Lab 09/09/15 0058 09/10/15 0439 09/11/15 0634 09/12/15 0515  WBC 8.3 5.6 4.8 5.4  HGB 14.9 12.3 13.4 13.8  HCT 44.2 37.0 39.5 41.9  PLT 192 150 150 182  MCV 89.3 89.1 89.7 88.9  MCH 30.1 29.7 30.4 29.3  MCHC 33.7 33.3 33.9 33.0  RDW 14.3 14.2 14.2 14.0  LYMPHSABS 1.5  --   --   --  MONOABS 0.4  --   --   --   EOSABS 0.0  --   --   --   BASOSABS 0.0  --   --   --     Chemistries   Recent Labs Lab 09/09/15 0058  NA 139  K 3.7  CL 107  CO2 23  GLUCOSE 89  BUN 9  CREATININE 0.77  CALCIUM 8.8*   ------------------------------------------------------------------------------------------------------------------ estimated creatinine clearance is 115.2 mL/min (by C-G formula based on Cr of 0.77). ------------------------------------------------------------------------------------------------------------------ No results for input(s): HGBA1C in the last 72 hours. ------------------------------------------------------------------------------------------------------------------ No results for input(s): CHOL, HDL, LDLCALC, TRIG, CHOLHDL, LDLDIRECT in the last 72 hours. ------------------------------------------------------------------------------------------------------------------ No results for input(s): TSH, T4TOTAL, T3FREE, THYROIDAB in  the last 72 hours.  Invalid input(s): FREET3 ------------------------------------------------------------------------------------------------------------------ No results for input(s): VITAMINB12, FOLATE, FERRITIN, TIBC, IRON, RETICCTPCT in the last 72 hours.  Coagulation profile  Recent Labs Lab 09/09/15 0058  INR 1.12    No results for input(s): DDIMER in the last 72 hours.  Cardiac Enzymes No results for input(s): CKMB, TROPONINI, MYOGLOBIN in the last 168 hours.  Invalid input(s): CK ------------------------------------------------------------------------------------------------------------------ Invalid input(s): POCBNP    Assessment & Plan   This is a 25 year old female admitted for dural venous thrombus. 1. Dural venous thrombosis:  Continue heparin drip, MRI was ordered neurology today. Results pending Anti-thrombin activity was normal. Continue IV fluid 2. Obesity: BMI is 34.8; encouraged healthy diet and exercise 3. DVT prophylaxis: As above 4. GI prophylaxis: None The patient is a full code.       Code Status Orders        Start     Ordered   09/09/15 1610  Full code   Continuous     09/09/15 0610    Code Status History    Date Active Date Inactive Code Status Order ID Comments User Context   This patient has a current code status but no historical code status.           Consults  neurology DVT Prophylaxis  heparin  Lab Results  Component Value Date   PLT 182 09/12/2015     Time Spent in minutes   25 minutes spent [place] Bilberry M.D on 09/12/2015 at 1:49 PM  Between 7am to 6pm - Pager - [phone]  After 6pm go to [email or web address] - password [email or web address]  [facility]   Office  [phone]

## 2015-09-12 NOTE — Progress Notes (Signed)
ANTICOAGULATION CONSULT NOTE - Initial Consult  Pharmacy Consult for Heparin Indication: stroke  No Known Allergies  Patient Measurements: Height:  (157.5 cm) Weight: 205 lb 4.8 oz (93.123 kg) IBW/kg (Calculated) : 50.1 Heparin Dosing Weight: 71 kg  Vital Signs: Temp: 97.5 F (36.4 C) (02/28 0504) Temp Source: Oral (02/28 0504) BP: 103/65 mmHg (02/28 0504) Pulse Rate: 72 (02/28 0504)  Labs:  Recent Labs  09/10/15 0439  09/11/15 0634 09/11/15 1311 09/12/15 0515  HGB 12.3  --  13.4  --  13.8  HCT 37.0  --  39.5  --  41.9  PLT 150  --  150  --  182  HEPARINUNFRC 0.12*  < > 0.50 0.37 0.39  < > = values in this interval not displayed.  Estimated Creatinine Clearance: 115.2 mL/min (by C-G formula based on Cr of 0.77).   Medical History: History reviewed. No pertinent past medical history.  Medications:  Scheduled:  . calcium carbonate  1 tablet Oral BID WC  . docusate sodium  100 mg Oral BID  . sodium chloride flush  3 mL Intravenous Q12H   Infusions:  . sodium chloride 125 mL/hr at 09/11/15 1939  . heparin 1,250 Units/hr (09/11/15 1541)    Assessment: 25 y/o F with dural venous thrombosis. Currently on Heparin drip at 1250 units/hr.   2/27 0630 Heparin level resulted at 0.5 2/27 1311 Heparin level resulted at 0.37  Goal of Therapy:  Heparin level 0.3- 0.5 units/ml Monitor platelets by anticoagulation protocol: Yes   Plan:  Heparin level therapeutic. Continue current rate. Will check next Heparin level and CBC with am labs.   Shazia Mitchener A. [place], [place].D., BCPS Clinical Pharmacist  09/12/2015 6:50 AM

## 2015-09-13 DIAGNOSIS — M542 Cervicalgia: Secondary | ICD-10-CM | POA: Diagnosis present

## 2015-09-13 DIAGNOSIS — I639 Cerebral infarction, unspecified: Secondary | ICD-10-CM | POA: Diagnosis present

## 2015-09-13 DIAGNOSIS — Z6834 Body mass index (BMI) 34.0-34.9, adult: Secondary | ICD-10-CM | POA: Diagnosis not present

## 2015-09-13 DIAGNOSIS — E669 Obesity, unspecified: Secondary | ICD-10-CM | POA: Diagnosis present

## 2015-09-13 DIAGNOSIS — G08 Intracranial and intraspinal phlebitis and thrombophlebitis: Secondary | ICD-10-CM | POA: Diagnosis not present

## 2015-09-13 DIAGNOSIS — Z79899 Other long term (current) drug therapy: Secondary | ICD-10-CM | POA: Diagnosis not present

## 2015-09-13 LAB — CBC
HCT: 40.1 % (ref 35.0–47.0)
HEMOGLOBIN: 13.5 g/dL (ref 12.0–16.0)
MCH: 30 pg (ref 26.0–34.0)
MCHC: 33.6 g/dL (ref 32.0–36.0)
MCV: 89.2 fL (ref 80.0–100.0)
Platelets: 160 10*3/uL (ref 150–440)
RBC: 4.49 MIL/uL (ref 3.80–5.20)
RDW: 13.8 % (ref 11.5–14.5)
WBC: 5.6 10*3/uL (ref 3.6–11.0)

## 2015-09-13 LAB — PROTIME-INR
INR: 1.02
PROTHROMBIN TIME: 13.6 s (ref 11.4–15.0)

## 2015-09-13 LAB — HEPARIN LEVEL (UNFRACTIONATED): HEPARIN UNFRACTIONATED: 0.32 [IU]/mL (ref 0.30–0.70)

## 2015-09-13 MED ORDER — WARFARIN - PHARMACIST DOSING INPATIENT
Freq: Every day | Status: DC
  Administered 2015-09-13 – 2015-09-18 (×6)

## 2015-09-13 MED ORDER — WARFARIN SODIUM 10 MG PO TABS
10.0000 mg | ORAL_TABLET | Freq: Every day | ORAL | Status: DC
  Administered 2015-09-13 – 2015-09-14 (×2): 10 mg via ORAL
  Filled 2015-09-13 (×2): qty 1

## 2015-09-13 NOTE — Progress Notes (Signed)
Subjective: No new complaints.  Remains on heparin  Objective: Current vital signs: BP 104/56 mmHg  Pulse 64  Temp(Src) 98.2 F (36.8 C) (Oral)  Resp 18  Ht 5\' 2"  (1.575 m)  Wt 93.35 kg (205 lb 12.8 oz)  BMI 37.63 kg/m2  SpO2 97%  LMP 08/25/2015 (Approximate) Vital signs in last 24 hours: Temp:  [98.2 F (36.8 C)-98.4 F (36.9 C)] 98.2 F (36.8 C) (03/01 0506) Pulse Rate:  [64-83] 64 (03/01 0506) Resp:  [18] 18 (03/01 0506) BP: (102-104)/(56-68) 104/56 mmHg (03/01 0506) SpO2:  [97 %-100 %] 97 % (03/01 0506) Weight:  [93.35 kg (205 lb 12.8 oz)] 93.35 kg (205 lb 12.8 oz) (03/01 0500)  Intake/Output from previous day: 02/28 0701 - 03/01 0700 In: 240 [P.O.:240] Out: -  Intake/Output this shift:   Nutritional status: Diet Heart Room service appropriate?: Yes; Fluid consistency:: Thin  Neurologic Exam: Mental Status: Alert, oriented, thought content appropriate. Speech fluent without evidence of aphasia. Able to follow 3 step commands without difficulty. Cranial Nerves: II: Discs flat bilaterally; Visual fields grossly normal, pupils equal, round, reactive to light and accommodation III,IV, VI: ptosis not present, extra-ocular motions intact bilaterally V,VII: smile symmetric, facial light touch sensation normal bilaterally VIII: hearing normal bilaterally IX,X: gag reflex present XI: bilateral shoulder shrug XII: left tongue deviation Motor: Right :Upper extremity 5/5Left: Upper extremity 5/5 Lower extremity 5/5Lower extremity 5/5 Tone and bulk:normal tone throughout; no atrophy noted  Lab Results: Basic Metabolic Panel:  Recent Labs Lab 09/09/15 0058  NA 139  K 3.7  CL 107  CO2 23  GLUCOSE 89  BUN 9  CREATININE 0.77  CALCIUM 8.8*    Liver Function Tests: No results for input(s): AST, ALT, ALKPHOS, BILITOT, PROT, ALBUMIN in the last 168  hours. No results for input(s): LIPASE, AMYLASE in the last 168 hours. No results for input(s): AMMONIA in the last 168 hours.  CBC:  Recent Labs Lab 09/09/15 0058 09/10/15 0439 09/11/15 0634 09/12/15 0515 09/13/15 0552  WBC 8.3 5.6 4.8 5.4 5.6  NEUTROABS 6.3  --   --   --   --   HGB 14.9 12.3 13.4 13.8 13.5  HCT 44.2 37.0 39.5 41.9 40.1  MCV 89.3 89.1 89.7 88.9 89.2  PLT 192 150 150 182 160    Cardiac Enzymes: No results for input(s): CKTOTAL, CKMB, CKMBINDEX, TROPONINI in the last 168 hours.  Lipid Panel: No results for input(s): CHOL, TRIG, HDL, CHOLHDL, VLDL, LDLCALC in the last 168 hours.  CBG: No results for input(s): GLUCAP in the last 168 hours.  Microbiology: No results found for this or any previous visit.  Coagulation Studies: No results for input(s): LABPROT, INR in the last 72 hours.  Imaging: Mr Shirlee Latch Wo Contrast  09/12/2015  CLINICAL DATA:  Right-sided neck pain radiating to head. Posterior orbital pain for 7 days. EXAM: MR VENOGRAM WITHOUT CONTRAST MR CIRCLE OF WILLIS WITHOUT CONTRAST MRA OF THE NECK WITHOUT AND WITH CONTRAST TECHNIQUE: Angiographic images of the Circle of Willis were obtained using MRA technique without intravenous contrast.; Multiplanar and multiecho pulse sequences of the neck were obtained without and with intravenous contrast. Venographic images of the head were obtained without contrast. CONTRAST:  19mL MULTIHANCE GADOBENATE DIMEGLUMINE 529 MG/ML IV SOLN COMPARISON:  CTA head neck 09/09/2015. FINDINGS: MR VENOGRAM FINDINGS There is diminished flow related enhancement in the RIGHT transverse sinus, sigmoid sinus, and internal jugular vein. Filling defects are seen within the sinus, consistent with thrombus. There is no  thrombus in the superior sagittal sinus, or LEFT transverse sinus. 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 Findings are consistent with acute venous sinus thrombosis, as suspected from CT. Some residual flow  can be seen within the RIGHT transverse, and sigmoid sinus, peripherally around the clock. MR CIRCLE OF WILLIS FINDINGS The internal carotid arteries are widely patent. The basilar artery is widely patent with the LEFT dominant. No proximal intracranial stenosis or aneurysm. MRA NECK FINDINGS Dolichoectasia.  Bovine trunk.  No proximal stenosis. Carotid bifurcations free of disease. No cervical ICA irregularity or dissection. LEFT vertebral dominant. Both contribute to formation of the basilar. Vertebral arteries show no ostial stenosis, or significant irregularity throughout their course through the neck. IMPRESSION: Negative MRA of the extracranial circulation and negative MRA of the intracranial circulation. MR venogram demonstrating findings consistent with acute RIGHT transverse and sigmoid sinus thrombosis, extending to the RIGHT jugular vein, with slight residual peripheral patency. Similar appearance to CT, when technique differences are considered. Electronically Signed   By: Elsie Stain M.D.   On: 09/12/2015 14:07   Mr Angiogram Neck W Wo Contrast  09/12/2015  CLINICAL DATA:  Right-sided neck pain radiating to head. Posterior orbital pain for 7 days. EXAM: MR VENOGRAM WITHOUT CONTRAST MR CIRCLE OF WILLIS WITHOUT CONTRAST MRA OF THE NECK WITHOUT AND WITH CONTRAST TECHNIQUE: Angiographic images of the Circle of Willis were obtained using MRA technique without intravenous contrast.; Multiplanar and multiecho pulse sequences of the neck were obtained without and with intravenous contrast. Venographic images of the head were obtained without contrast. CONTRAST:  19mL MULTIHANCE GADOBENATE DIMEGLUMINE 529 MG/ML IV SOLN COMPARISON:  CTA head neck 09/09/2015. FINDINGS: MR VENOGRAM FINDINGS There is diminished flow related enhancement in the RIGHT transverse sinus, sigmoid sinus, and internal jugular vein. Filling defects are seen within the sinus, consistent with thrombus. There is no thrombus in the superior  sagittal sinus, or LEFT transverse sinus. 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 Findings are consistent with acute venous sinus thrombosis, as suspected from CT. Some residual flow can be seen within the RIGHT transverse, and sigmoid sinus, peripherally around the clock. MR CIRCLE OF WILLIS FINDINGS The internal carotid arteries are widely patent. The basilar artery is widely patent with the LEFT dominant. No proximal intracranial stenosis or aneurysm. MRA NECK FINDINGS Dolichoectasia.  Bovine trunk.  No proximal stenosis. Carotid bifurcations free of disease. No cervical ICA irregularity or dissection. LEFT vertebral dominant. Both contribute to formation of the basilar. Vertebral arteries show no ostial stenosis, or significant irregularity throughout their course through the neck. IMPRESSION: Negative MRA of the extracranial circulation and negative MRA of the intracranial circulation. MR venogram demonstrating findings consistent with acute RIGHT transverse and sigmoid sinus thrombosis, extending to the RIGHT jugular vein, with slight residual peripheral patency. Similar appearance to CT, when technique differences are considered. Electronically Signed   By: Elsie Stain M.D.   On: 09/12/2015 14:07   Mr [place]  09/12/2015  CLINICAL DATA:  Right-sided neck pain radiating to head. Posterior orbital pain for 7 days. EXAM: MR VENOGRAM WITHOUT CONTRAST MR CIRCLE OF WILLIS WITHOUT CONTRAST MRA OF THE NECK WITHOUT AND WITH CONTRAST TECHNIQUE: Angiographic images of the Circle of Willis were obtained using MRA technique without intravenous contrast.; Multiplanar and multiecho pulse sequences of the neck were obtained without and with intravenous contrast. Venographic images of the head were obtained without contrast. CONTRAST:  19mL MULTIHANCE GADOBENATE DIMEGLUMINE 529 MG/ML IV SOLN COMPARISON:  CTA  head neck 09/09/2015. FINDINGS: MR VENOGRAM FINDINGS There is diminished flow related  enhancement in the RIGHT transverse sinus, sigmoid sinus, and internal jugular vein. Filling defects are seen within the sinus, consistent with thrombus. There is no thrombus in the superior sagittal sinus, or LEFT transverse sinus. 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 Findings are consistent with acute venous sinus thrombosis, as suspected from CT. Some residual flow can be seen within the RIGHT transverse, and sigmoid sinus, peripherally around the clock. MR CIRCLE OF WILLIS FINDINGS The internal carotid arteries are widely patent. The basilar artery is widely patent with the LEFT dominant. No proximal intracranial stenosis or aneurysm. MRA NECK FINDINGS Dolichoectasia.  Bovine trunk.  No proximal stenosis. Carotid bifurcations free of disease. No cervical ICA irregularity or dissection. LEFT vertebral dominant. Both contribute to formation of the basilar. Vertebral arteries show no ostial stenosis, or significant irregularity throughout their course through the neck. IMPRESSION: Negative MRA of the extracranial circulation and negative MRA of the intracranial circulation. MR venogram demonstrating findings consistent with acute RIGHT transverse and sigmoid sinus thrombosis, extending to the RIGHT jugular vein, with slight residual peripheral patency. Similar appearance to CT, when technique differences are considered. Electronically Signed   By: Elsie Stain M.D.   On: 09/12/2015 14:07    Medications:  I have reviewed the patient's current medications. Scheduled: . calcium carbonate  1 tablet Oral BID WC  . docusate sodium  100 mg Oral BID  . sodium chloride flush  3 mL Intravenous Q12H    Assessment/Plan: No new complaints.  On heparin.  MRA and MRV performed today and show no progression of her thrombosis.    Recommendations: 1.  With no progression of symptoms or imaging would start Coumadin with target INR between 2 and 3 to be achieved prior to discontinuation of heparin.      LOS: 4 days   Thana Farr, MD Neurology [phone] 09/13/2015  1:09 PM

## 2015-09-13 NOTE — Progress Notes (Signed)
[facility] - [place] at [facility]                                                                                                                                                                                            Patient Demographics   Maria Brooks, is a 25 y.o. female, DOB - July 27, 1990, ZOX:[id number]  Admit date - 09/08/2015   Admitting Physician Arnaldo Natal, MD  Outpatient Primary MD for the patient is No PCP Per Patient   LOS - 4  Subjective:  Denies any headaches     Review of Systems:   CONSTITUTIONAL: No documented fever. No fatigue, weakness. No weight gain, no weight loss.  EYES: No blurry or double vision.  ENT: No tinnitus. No postnasal drip. No redness of the oropharynx.  RESPIRATORY: No cough, no wheeze, no hemoptysis. No dyspnea.  CARDIOVASCULAR: No chest pain. No orthopnea. No palpitations. No syncope.  GASTROINTESTINAL: No nausea, no vomiting or diarrhea. No abdominal pain. No melena or hematochezia.  GENITOURINARY: No dysuria or hematuria.  ENDOCRINE: No polyuria or nocturia. No heat or cold intolerance.  HEMATOLOGY: No anemia. No bruising. No bleeding.  INTEGUMENTARY: No rashes. No lesions.  MUSCULOSKELETAL: No arthritis. No swelling. No gout.  NEUROLOGIC: No numbness, tingling, or ataxia. No seizure-type activity. Persistent headache PSYCHIATRIC: No anxiety. No insomnia. No ADD.    Vitals:   Filed Vitals:   09/12/15 2112 09/13/15 0500 09/13/15 0506 09/13/15 1329  BP: 103/64  104/56 104/64  Pulse: 81  64 83  Temp: 98.4 F (36.9 C)  98.2 F (36.8 C) 98.3 F (36.8 C)  TempSrc: Oral  Oral Oral  Resp: 18  18 18   Height:      Weight:  93.35 kg (205 lb 12.8 oz)    SpO2: 98%  97% 99%    Wt Readings from Last 3 Encounters:  09/13/15 93.35 kg (205 lb 12.8 oz)  09/27/14 87.998 kg (194 lb)     Intake/Output Summary (Last 24 hours) at 09/13/15 1544 Last data filed at 09/13/15 1323  Gross per 24 hour  Intake    240  ml  Output      0 ml  Net    240 ml    Physical Exam:   GENERAL: Pleasant-appearing in no apparent distress.  HEAD, EYES, EARS, NOSE AND THROAT: Atraumatic, normocephalic. Extraocular muscles are intact. Pupils equal and reactive to light. Sclerae anicteric. No conjunctival injection. No oro-pharyngeal erythema.  NECK: Supple. There is no jugular venous distention. No bruits, no lymphadenopathy, no thyromegaly.  HEART: Regular rate and rhythm,. No murmurs, no rubs, no clicks.  LUNGS: Clear to auscultation bilaterally. No rales or rhonchi. No wheezes.  ABDOMEN: Soft, flat, nontender, nondistended. Has good bowel sounds. No hepatosplenomegaly appreciated.  EXTREMITIES: No evidence of any cyanosis, clubbing, or peripheral edema.  +2 pedal and radial pulses bilaterally.  NEUROLOGIC: The patient is alert, awake, and oriented x3 with no focal motor or sensory deficits appreciated bilaterally.  SKIN: Moist and warm with no rashes appreciated.  Psych: Not anxious, depressed LN: No inguinal LN enlargement    Antibiotics   Anti-infectives    None      Medications   Scheduled Meds: . calcium carbonate  1 tablet Oral BID WC  . docusate sodium  100 mg Oral BID  . sodium chloride flush  3 mL Intravenous Q12H  . warfarin  10 mg Oral q1800  . Warfarin - Pharmacist Dosing Inpatient   Does not apply q1800   Continuous Infusions: . sodium chloride 100 mL/hr at 09/13/15 1015  . heparin 1,250 Units/hr (09/13/15 1015)   PRN Meds:.acetaminophen **OR** acetaminophen, ibuprofen, morphine injection, ondansetron **OR** ondansetron (ZOFRAN) IV, oxyCODONE-acetaminophen   Data Review:   Micro Results No results found for this or any previous visit (from the past 240 hour(s)).  Radiology Reports Ct Angio Head W/cm &/or Wo Cm  09/09/2015  CLINICAL DATA:  RIGHT-sided neck pain radiating to head, posterior orbital pain for 4 days. EXAM: CT ANGIOGRAPHY HEAD AND NECK TECHNIQUE: Multidetector CT imaging  of the head and neck was performed using the standard protocol during bolus administration of intravenous contrast. Multiplanar CT image reconstructions and MIPs were obtained to evaluate the vascular anatomy. Carotid stenosis measurements (when applicable) are obtained utilizing NASCET criteria, using the distal internal carotid diameter as the denominator. CONTRAST:  OMNIPAQUE IOHEXOL 350 MG/ML SOLN COMPARISON:  None. FINDINGS: CT HEAD The ventricles and sulci are normal. No intraparenchymal hemorrhage, mass effect nor midline shift. No acute large vascular territory infarcts. No abnormal extra-axial fluid collections. Asymmetric density RIGHT transverse, sigmoid sinus. Equivocal Basal cisterns are patent. No skull fracture. The included ocular globes and orbital contents are non-suspicious. The mastoid aircells and included paranasal sinuses are well-aerated. CTA NECK Aortic arch: Normal appearance of the thoracic arch, normal branch pattern. The origins of the innominate, left Common carotid artery and subclavian artery are widely patent. Right carotid system: Common carotid artery is widely patent, coursing in a straight line fashion. Normal appearance of the carotid bifurcation without hemodynamically significant stenosis by NASCET criteria. Normal appearance of the included internal carotid artery. Left carotid system: Common carotid artery is widely patent, coursing in a straight line fashion. Normal appearance of the carotid bifurcation without hemodynamically significant stenosis by NASCET criteria. Normal appearance of the included internal carotid artery. Vertebral arteries:Left vertebral artery is dominant. Normal appearance of the vertebral arteries, which appear widely patent. Skeleton: No acute osseous process though bone windows have not been submitted. Other neck: Soft tissues of the neck are nonacute though, not tailored for evaluation. Subcentimeter thyroid nodules, below size followup  recommendations. CTA HEAD Anterior circulation: Normal appearance of the cervical internal carotid arteries, petrous, cavernous and supra clinoid internal carotid arteries. Widely patent anterior communicating artery. Normal appearance of the anterior and middle cerebral arteries. Posterior circulation: Normal appearance of the vertebral arteries, vertebrobasilar junction and basilar artery, as well as main branch vessels. Normal appearance of the posterior cerebral arteries. No large vessel occlusion, hemodynamically significant stenosis, dissection, luminal irregularity, contrast extravasation or aneurysm within the anterior nor posterior circulation. Asymmetric loss of RIGHT transverse,  sigmoid and internal jugular vein enhancement. IMPRESSION: CT HEAD: Dense RIGHT transverse, sigmoid sinus compatible with acute thrombosis. Otherwise negative CT head. CTA NECK:  Negative. CTA HEAD: Filling defect RIGHT transverse, sigmoid and internal jugular veins consistent with acute dural venous sinus thrombosis. Otherwise negative CTA head. Acute findings discussed with and reconfirmed by Dr.CORY [facility] on 09/09/2015 at 2:25 am. Electronically Signed   By: Awilda Metro M.D.   On: 09/09/2015 02:27   Ct Angio Neck W/cm &/or Wo/cm  09/09/2015  CLINICAL DATA:  RIGHT-sided neck pain radiating to head, posterior orbital pain for 4 days. EXAM: CT ANGIOGRAPHY HEAD AND NECK TECHNIQUE: Multidetector CT imaging of the head and neck was performed using the standard protocol during bolus administration of intravenous contrast. Multiplanar CT image reconstructions and MIPs were obtained to evaluate the vascular anatomy. Carotid stenosis measurements (when applicable) are obtained utilizing NASCET criteria, using the distal internal carotid diameter as the denominator. CONTRAST:  OMNIPAQUE IOHEXOL 350 MG/ML SOLN COMPARISON:  None. FINDINGS: CT HEAD The ventricles and sulci are normal. No intraparenchymal hemorrhage, mass  effect nor midline shift. No acute large vascular territory infarcts. No abnormal extra-axial fluid collections. Asymmetric density RIGHT transverse, sigmoid sinus. Equivocal Basal cisterns are patent. No skull fracture. The included ocular globes and orbital contents are non-suspicious. The mastoid aircells and included paranasal sinuses are well-aerated. CTA NECK Aortic arch: Normal appearance of the thoracic arch, normal branch pattern. The origins of the innominate, left Common carotid artery and subclavian artery are widely patent. Right carotid system: Common carotid artery is widely patent, coursing in a straight line fashion. Normal appearance of the carotid bifurcation without hemodynamically significant stenosis by NASCET criteria. Normal appearance of the included internal carotid artery. Left carotid system: Common carotid artery is widely patent, coursing in a straight line fashion. Normal appearance of the carotid bifurcation without hemodynamically significant stenosis by NASCET criteria. Normal appearance of the included internal carotid artery. Vertebral arteries:Left vertebral artery is dominant. Normal appearance of the vertebral arteries, which appear widely patent. Skeleton: No acute osseous process though bone windows have not been submitted. Other neck: Soft tissues of the neck are nonacute though, not tailored for evaluation. Subcentimeter thyroid nodules, below size followup recommendations. CTA HEAD Anterior circulation: Normal appearance of the cervical internal carotid arteries, petrous, cavernous and supra clinoid internal carotid arteries. Widely patent anterior communicating artery. Normal appearance of the anterior and middle cerebral arteries. Posterior circulation: Normal appearance of the vertebral arteries, vertebrobasilar junction and basilar artery, as well as main branch vessels. Normal appearance of the posterior cerebral arteries. No large vessel occlusion, hemodynamically  significant stenosis, dissection, luminal irregularity, contrast extravasation or aneurysm within the anterior nor posterior circulation. Asymmetric loss of RIGHT transverse, sigmoid and internal jugular vein enhancement. IMPRESSION: CT HEAD: Dense RIGHT transverse, sigmoid sinus compatible with acute thrombosis. Otherwise negative CT head. CTA NECK:  Negative. CTA HEAD: Filling defect RIGHT transverse, sigmoid and internal jugular veins consistent with acute dural venous sinus thrombosis. Otherwise negative CTA head. Acute findings discussed with and reconfirmed by Dr.CORY [facility] on 09/09/2015 at 2:25 am. Electronically Signed   By: Awilda Metro M.D.   On: 09/09/2015 02:27   Mr Maxine Glenn Head Wo Contrast  09/12/2015  CLINICAL DATA:  Right-sided neck pain radiating to head. Posterior orbital pain for 7 days. EXAM: MR VENOGRAM WITHOUT CONTRAST MR CIRCLE OF WILLIS WITHOUT CONTRAST MRA OF THE NECK WITHOUT AND WITH CONTRAST TECHNIQUE: Angiographic images of the Circle of Willis were obtained using MRA  technique without intravenous contrast.; Multiplanar and multiecho pulse sequences of the neck were obtained without and with intravenous contrast. Venographic images of the head were obtained without contrast. CONTRAST:  19mL MULTIHANCE GADOBENATE DIMEGLUMINE 529 MG/ML IV SOLN COMPARISON:  CTA head neck 09/09/2015. FINDINGS: MR VENOGRAM FINDINGS There is diminished flow related enhancement in the RIGHT transverse sinus, sigmoid sinus, and internal jugular vein. Filling defects are seen within the sinus, consistent with thrombus. There is no thrombus in the superior sagittal sinus, or LEFT transverse sinus. 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 Findings are consistent with acute venous sinus thrombosis, as suspected from CT. Some residual flow can be seen within the RIGHT transverse, and sigmoid sinus, peripherally around the clock. MR CIRCLE OF WILLIS FINDINGS The internal carotid arteries are widely  patent. The basilar artery is widely patent with the LEFT dominant. No proximal intracranial stenosis or aneurysm. MRA NECK FINDINGS Dolichoectasia.  Bovine trunk.  No proximal stenosis. Carotid bifurcations free of disease. No cervical ICA irregularity or dissection. LEFT vertebral dominant. Both contribute to formation of the basilar. Vertebral arteries show no ostial stenosis, or significant irregularity throughout their course through the neck. IMPRESSION: Negative MRA of the extracranial circulation and negative MRA of the intracranial circulation. MR venogram demonstrating findings consistent with acute RIGHT transverse and sigmoid sinus thrombosis, extending to the RIGHT jugular vein, with slight residual peripheral patency. Similar appearance to CT, when technique differences are considered. Electronically Signed   By: Elsie Stain M.D.   On: 09/12/2015 14:07   Mr Angiogram Neck W Wo Contrast  09/12/2015  CLINICAL DATA:  Right-sided neck pain radiating to head. Posterior orbital pain for 7 days. EXAM: MR VENOGRAM WITHOUT CONTRAST MR CIRCLE OF WILLIS WITHOUT CONTRAST MRA OF THE NECK WITHOUT AND WITH CONTRAST TECHNIQUE: Angiographic images of the Circle of Willis were obtained using MRA technique without intravenous contrast.; Multiplanar and multiecho pulse sequences of the neck were obtained without and with intravenous contrast. Venographic images of the head were obtained without contrast. CONTRAST:  19mL MULTIHANCE GADOBENATE DIMEGLUMINE 529 MG/ML IV SOLN COMPARISON:  CTA head neck 09/09/2015. FINDINGS: MR VENOGRAM FINDINGS There is diminished flow related enhancement in the RIGHT transverse sinus, sigmoid sinus, and internal jugular vein. Filling defects are seen within the sinus, consistent with thrombus. There is no thrombus in the superior sagittal sinus, or LEFT transverse sinus. 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 Findings are consistent with acute venous sinus thrombosis, as  suspected from CT. Some residual flow can be seen within the RIGHT transverse, and sigmoid sinus, peripherally around the clock. MR CIRCLE OF WILLIS FINDINGS The internal carotid arteries are widely patent. The basilar artery is widely patent with the LEFT dominant. No proximal intracranial stenosis or aneurysm. MRA NECK FINDINGS Dolichoectasia.  Bovine trunk.  No proximal stenosis. Carotid bifurcations free of disease. No cervical ICA irregularity or dissection. LEFT vertebral dominant. Both contribute to formation of the basilar. Vertebral arteries show no ostial stenosis, or significant irregularity throughout their course through the neck. IMPRESSION: Negative MRA of the extracranial circulation and negative MRA of the intracranial circulation. MR venogram demonstrating findings consistent with acute RIGHT transverse and sigmoid sinus thrombosis, extending to the RIGHT jugular vein, with slight residual peripheral patency. Similar appearance to CT, when technique differences are considered. Electronically Signed   By: Elsie Stain M.D.   On: 09/12/2015 14:07   Mr [place]  09/12/2015  CLINICAL DATA:  Right-sided neck pain radiating to head.  Posterior orbital pain for 7 days. EXAM: MR VENOGRAM WITHOUT CONTRAST MR CIRCLE OF WILLIS WITHOUT CONTRAST MRA OF THE NECK WITHOUT AND WITH CONTRAST TECHNIQUE: Angiographic images of the Circle of Willis were obtained using MRA technique without intravenous contrast.; Multiplanar and multiecho pulse sequences of the neck were obtained without and with intravenous contrast. Venographic images of the head were obtained without contrast. CONTRAST:  19mL MULTIHANCE GADOBENATE DIMEGLUMINE 529 MG/ML IV SOLN COMPARISON:  CTA head neck 09/09/2015. FINDINGS: MR VENOGRAM FINDINGS There is diminished flow related enhancement in the RIGHT transverse sinus, sigmoid sinus, and internal jugular vein. Filling defects are seen within the sinus, consistent with thrombus. There is no  thrombus in the superior sagittal sinus, or LEFT transverse sinus. 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 Findings are consistent with acute venous sinus thrombosis, as suspected from CT. Some residual flow can be seen within the RIGHT transverse, and sigmoid sinus, peripherally around the clock. MR CIRCLE OF WILLIS FINDINGS The internal carotid arteries are widely patent. The basilar artery is widely patent with the LEFT dominant. No proximal intracranial stenosis or aneurysm. MRA NECK FINDINGS Dolichoectasia.  Bovine trunk.  No proximal stenosis. Carotid bifurcations free of disease. No cervical ICA irregularity or dissection. LEFT vertebral dominant. Both contribute to formation of the basilar. Vertebral arteries show no ostial stenosis, or significant irregularity throughout their course through the neck. IMPRESSION: Negative MRA of the extracranial circulation and negative MRA of the intracranial circulation. MR venogram demonstrating findings consistent with acute RIGHT transverse and sigmoid sinus thrombosis, extending to the RIGHT jugular vein, with slight residual peripheral patency. Similar appearance to CT, when technique differences are considered. Electronically Signed   By: Elsie Stain M.D.   On: 09/12/2015 14:07     CBC  Recent Labs Lab 09/09/15 0058 09/10/15 0439 09/11/15 0634 09/12/15 0515 09/13/15 0552  WBC 8.3 5.6 4.8 5.4 5.6  HGB 14.9 12.3 13.4 13.8 13.5  HCT 44.2 37.0 39.5 41.9 40.1  PLT 192 150 150 182 160  MCV 89.3 89.1 89.7 88.9 89.2  MCH 30.1 29.7 30.4 29.3 30.0  MCHC 33.7 33.3 33.9 33.0 33.6  RDW 14.3 14.2 14.2 14.0 13.8  LYMPHSABS 1.5  --   --   --   --   MONOABS 0.4  --   --   --   --   EOSABS 0.0  --   --   --   --   BASOSABS 0.0  --   --   --   --     Chemistries   Recent Labs Lab 09/09/15 0058  NA 139  K 3.7  CL 107  CO2 23  GLUCOSE 89  BUN 9  CREATININE 0.77  CALCIUM 8.8*    ------------------------------------------------------------------------------------------------------------------ estimated creatinine clearance is 115.4 mL/min (by C-G formula based on Cr of 0.77). ------------------------------------------------------------------------------------------------------------------ No results for input(s): HGBA1C in the last 72 hours. ------------------------------------------------------------------------------------------------------------------ No results for input(s): CHOL, HDL, LDLCALC, TRIG, CHOLHDL, LDLDIRECT in the last 72 hours. ------------------------------------------------------------------------------------------------------------------ No results for input(s): TSH, T4TOTAL, T3FREE, THYROIDAB in the last 72 hours.  Invalid input(s): FREET3 ------------------------------------------------------------------------------------------------------------------ No results for input(s): VITAMINB12, FOLATE, FERRITIN, TIBC, IRON, RETICCTPCT in the last 72 hours.  Coagulation profile  Recent Labs Lab 09/09/15 0058 09/13/15 0552  INR 1.12 1.02    No results for input(s): DDIMER in the last 72 hours.  Cardiac Enzymes No results for input(s): CKMB, TROPONINI, MYOGLOBIN in the last 168 hours.  Invalid input(s): CK ------------------------------------------------------------------------------------------------------------------ Invalid input(s): POCBNP  Assessment & Plan   This is a 25 year old female admitted for dural venous thrombus. 1. Dural venous thrombosis:  Repeat MRI shows stable thrombus no evidence of extension I have discussed the case with neurology they recommend bridging patient with Coumadin today. 2. Obesity: BMI is 34.8; encouraged healthy diet and exercise 3. DVT prophylaxis: As above 4. GI prophylaxis: None The patient is a full code.       Code Status Orders        Start     Ordered   09/09/15 1610  Full code    Continuous     09/09/15 0610    Code Status History    Date Active Date Inactive Code Status Order ID Comments User Context   This patient has a current code status but no historical code status.           Consults  neurology DVT Prophylaxis  heparin  Lab Results  Component Value Date   PLT 160 09/13/2015     Time Spent in minutes   25 minutes spent Auburn Bilberry M.D on 09/13/2015 at 3:44 PM  Between 7am to 6pm - Pager - [phone]  After 6pm go to [email or web address] - password [email or web address] [facility]  [facility] [place] [facility]   Office  [phone]

## 2015-09-13 NOTE — Progress Notes (Signed)
ANTICOAGULATION CONSULT NOTE - Initial Consult  Pharmacy Consult for Coumadin Indication: Dural Venous Thrombis  No Known Allergies  Patient Measurements: Height:  (157.5 cm) Weight: 205 lb 12.8 oz (93.35 kg) IBW/kg (Calculated) : 50.1 Heparin Dosing Weight: na  Vital Signs: Temp: 98.3 F (36.8 C) (03/01 1329) Temp Source: Oral (03/01 1329) BP: 104/64 mmHg (03/01 1329) Pulse Rate: 83 (03/01 1329)  Labs:  Recent Labs  09/11/15 0634 09/11/15 1311 09/12/15 0515 09/13/15 0552  HGB 13.4  --  13.8 13.5  HCT 39.5  --  41.9 40.1  PLT 150  --  182 160  HEPARINUNFRC 0.50 0.37 0.39 0.32    Estimated Creatinine Clearance: 115.4 mL/min (by C-G formula based on Cr of 0.77).   Medical History: History reviewed. No pertinent past medical history.  Medications:  Scheduled:  . calcium carbonate  1 tablet Oral BID WC  . docusate sodium  100 mg Oral BID  . sodium chloride flush  3 mL Intravenous Q12H  . warfarin  10 mg Oral q1800  . Warfarin - Pharmacist Dosing Inpatient   Does not apply q1800   Infusions:  . sodium chloride 100 mL/hr at 09/13/15 1015  . heparin 1,250 Units/hr (09/13/15 1015)    Assessment: Patient is a 25yo female admitted for dural venous thrombis. Initiated on Heparin infusion. Pharmacy consulted for Coumadin dosing.  Goal of Therapy:  INR 2-3 Monitor platelets by anticoagulation protocol: Yes   Plan:  Patient last INR was 1.12 on 2/24. Will get a baseline INR from today. Will order Coumadin  daily. Will check INR daily.  Clovia Cuff, PharmD, BCPS 09/13/2015 1:40 PM

## 2015-09-13 NOTE — Progress Notes (Signed)
ANTICOAGULATION CONSULT NOTE - Initial Consult  Pharmacy Consult for Heparin Indication: stroke  No Known Allergies  Patient Measurements: Height:  (157.5 cm) Weight: 205 lb 12.8 oz (93.35 kg) IBW/kg (Calculated) : 50.1 Heparin Dosing Weight: 71 kg  Vital Signs: Temp: 98.2 F (36.8 C) (03/01 0506) Temp Source: Oral (03/01 0506) BP: 104/56 mmHg (03/01 0506) Pulse Rate: 64 (03/01 0506)  Labs:  Recent Labs  09/11/15 0634 09/11/15 1311 09/12/15 0515 09/13/15 0552  HGB 13.4  --  13.8 13.5  HCT 39.5  --  41.9 40.1  PLT 150  --  182 160  HEPARINUNFRC 0.50 0.37 0.39 0.32    Estimated Creatinine Clearance: 115.4 mL/min (by C-G formula based on Cr of 0.77).   Medical History: History reviewed. No pertinent past medical history.  Medications:  Scheduled:  . calcium carbonate  1 tablet Oral BID WC  . docusate sodium  100 mg Oral BID  . sodium chloride flush  3 mL Intravenous Q12H   Infusions:  . sodium chloride 100 mL/hr at 09/12/15 2357  . heparin 1,250 Units/hr (09/12/15 1316)    Assessment: 25 y/o F with dural venous thrombosis. Currently on Heparin drip at 1250 units/hr.   2/27 0630 Heparin level resulted at 0.5 2/27 1311 Heparin level resulted at 0.37  Goal of Therapy:  Heparin level 0.3- 0.5 units/ml Monitor platelets by anticoagulation protocol: Yes   Plan:  Heparin level therapeutic. Continue current rate. Will check next Heparin level and CBC with am labs.   Finlay Godbee A. [place], [place].D., BCPS Clinical Pharmacist  09/13/2015 6:47 AM

## 2015-09-14 LAB — BETA-2-GLYCOPROTEIN I ABS, IGG/M/A

## 2015-09-14 LAB — CBC
HCT: 40.1 % (ref 35.0–47.0)
Hemoglobin: 13.4 g/dL (ref 12.0–16.0)
MCH: 30.3 pg (ref 26.0–34.0)
MCHC: 33.5 g/dL (ref 32.0–36.0)
MCV: 90.4 fL (ref 80.0–100.0)
PLATELETS: 166 10*3/uL (ref 150–440)
RBC: 4.44 MIL/uL (ref 3.80–5.20)
RDW: 14 % (ref 11.5–14.5)
WBC: 5.7 10*3/uL (ref 3.6–11.0)

## 2015-09-14 LAB — PROTEIN S PANEL
Protein S Activity: 109 % (ref 63–140)
Protein S Ag, Free: 67 % (ref 57–157)
Protein S Ag, Total: 81 % (ref 60–150)

## 2015-09-14 LAB — PROTEIN C ACTIVITY: PROTEIN C ACTIVITY: 121 % (ref 73–180)

## 2015-09-14 LAB — LUPUS ANTICOAGULANT PANEL
DRVVT: 31.8 s (ref 0.0–44.0)
PTT Lupus Anticoagulant: 34.9 s (ref 0.0–43.6)

## 2015-09-14 LAB — CARDIOLIPIN ANTIBODIES, IGG, IGM, IGA
Anticardiolipin IgG: <9 GPL U/mL (ref 0–14)
Anticardiolipin IgM: <9 MPL U/mL (ref 0–12)

## 2015-09-14 LAB — HOMOCYSTEINE: Homocysteine: 22.3 umol/L — ABNORMAL HIGH (ref 0.0–15.0)

## 2015-09-14 LAB — HEPARIN LEVEL (UNFRACTIONATED): HEPARIN UNFRACTIONATED: 0.41 [IU]/mL (ref 0.30–0.70)

## 2015-09-14 LAB — PROTIME-INR
INR: 1.06
Prothrombin Time: 14 seconds (ref 11.4–15.0)

## 2015-09-14 LAB — PROTEIN C DEFICIENCY PROFILE
Protein C Activity: 109 % (ref 73–180)
Protein C, Total: 91 % (ref 60–150)

## 2015-09-14 LAB — PROTEIN C, TOTAL: Protein C, Total: 86 % (ref 60–150)

## 2015-09-14 LAB — PROTHROMBIN GENE MUTATION

## 2015-09-14 LAB — PROTEIN S ACTIVITY: PROTEIN S ACTIVITY: 65 % (ref 63–140)

## 2015-09-14 LAB — PROTEIN S, TOTAL: Protein S Ag, Total: 91 % (ref 60–150)

## 2015-09-14 LAB — FACTOR 5 LEIDEN

## 2015-09-14 NOTE — Progress Notes (Signed)
ANTICOAGULATION CONSULT NOTE - Initial Consult  Pharmacy Consult for Coumadin Indication: Dural Venous Thrombis  No Known Allergies  Patient Measurements: Height:  (157.5 cm) Weight: 207 lb (93.895 kg) IBW/kg (Calculated) : 50.1 Heparin Dosing Weight: na  Vital Signs: Temp: 97.7 F (36.5 C) (03/02 0601) Temp Source: Oral (03/02 0601) BP: 103/68 mmHg (03/02 0601) Pulse Rate: 80 (03/02 0601)  Labs:  Recent Labs  09/12/15 0515 09/13/15 0552 09/14/15 0517  HGB 13.8 13.5 13.4  HCT 41.9 40.1 40.1  PLT 182 160 166  LABPROT  --  13.6 14.0  INR  --  1.02 1.06  HEPARINUNFRC 0.39 0.32 0.41    Estimated Creatinine Clearance: 115.7 mL/min (by C-G formula based on Cr of 0.77).   Medical History: History reviewed. No pertinent past medical history.  Medications:  Scheduled:  . calcium carbonate  1 tablet Oral BID WC  . docusate sodium  100 mg Oral BID  . sodium chloride flush  3 mL Intravenous Q12H  . warfarin  10 mg Oral q1800  . Warfarin - Pharmacist Dosing Inpatient   Does not apply q1800   Infusions:  . sodium chloride 100 mL/hr at 09/14/15 1610  . 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 Initiated on Heparin infusion. Pharmacy consulted for Coumadin dosing.  3/1 INR: 1.02, warfarin  3/2 INR: 1.06  Goal of Therapy:  INR 2-3 Monitor platelets by anticoagulation protocol: Yes   Plan:  Only small increase in INR from baseline, will continue Coumadin  daily. Will check INR daily.  Clovia Cuff, PharmD, BCPS 09/14/2015 10:40 AM

## 2015-09-14 NOTE — Progress Notes (Signed)
Subjective: No new complaints.  Coumadin started.  Somewhat tearful today.    Objective: Current vital signs: BP 103/68 mmHg  Pulse 80  Temp(Src) 97.7 F (36.5 C) (Oral)  Resp 19  Ht  (1.575 m)  Wt 93.895 kg (207 lb)  BMI 37.85 kg/m2  SpO2 97%  LMP 08/25/2015 (Approximate) Vital signs in last 24 hours: Temp:  [97.7 F (36.5 C)-98.3 F (36.8 C)] 97.7 F (36.5 C) (03/02 0601) Pulse Rate:  [80-93] 80 (03/02 0601) Resp:  [18-19] 19 (03/02 0601) BP: (100-104)/(64-68) 103/68 mmHg (03/02 0601) SpO2:  [97 %-99 %] 97 % (03/02 0601) Weight:  [93.895 kg (207 lb)] 93.895 kg (207 lb) (03/02 0601)  Intake/Output from previous day: 03/01 0701 - 03/02 0700 In: 240 [P.O.:240] Out: -  Intake/Output this shift: Total I/O In: -  Out: 1 [Urine:1] Nutritional status: Diet Heart Room service appropriate?: Yes; Fluid consistency:: Thin  Neurologic Exam: Mental Status: Alert, oriented, thought content appropriate. Speech fluent without evidence of aphasia. Able to follow 3 step commands without difficulty. Cranial Nerves: II: Discs flat bilaterally; Visual fields grossly normal, pupils equal, round, reactive to light and accommodation III,IV, VI: ptosis not present, extra-ocular motions intact bilaterally V,VII: smile symmetric, facial light touch sensation normal bilaterally VIII: hearing normal bilaterally IX,X: gag reflex present XI: bilateral shoulder shrug XII: left tongue deviation Motor: Right :Upper extremity 5/5Left: Upper extremity 5/5 Lower extremity 5/5Lower extremity 5/5 Tone and bulk:normal tone throughout; no atrophy noted  Lab Results: Basic Metabolic Panel:  Recent Labs Lab 09/09/15 0058  NA 139  K 3.7  CL 107  CO2 23  GLUCOSE 89  BUN 9  CREATININE 0.77  CALCIUM 8.8*    Liver Function Tests: No results for input(s): AST, ALT, ALKPHOS,  BILITOT, PROT, ALBUMIN in the last 168 hours. No results for input(s): LIPASE, AMYLASE in the last 168 hours. No results for input(s): AMMONIA in the last 168 hours.  CBC:  Recent Labs Lab 09/09/15 0058 09/10/15 0439 09/11/15 0634 09/12/15 0515 09/13/15 0552 09/14/15 0517  WBC 8.3 5.6 4.8 5.4 5.6 5.7  NEUTROABS 6.3  --   --   --   --   --   HGB 14.9 12.3 13.4 13.8 13.5 13.4  HCT 44.2 37.0 39.5 41.9 40.1 40.1  MCV 89.3 89.1 89.7 88.9 89.2 90.4  PLT 192 150 150 182 160 166    Cardiac Enzymes: No results for input(s): CKTOTAL, CKMB, CKMBINDEX, TROPONINI in the last 168 hours.  Lipid Panel: No results for input(s): CHOL, TRIG, HDL, CHOLHDL, VLDL, LDLCALC in the last 168 hours.  CBG: No results for input(s): GLUCAP in the last 168 hours.  Microbiology: No results found for this or any previous visit.  Coagulation Studies:  Recent Labs  09/13/15 0552 09/14/15 0517  LABPROT 13.6 14.0  INR 1.02 1.06    Imaging: Mr [facility] Contrast  09/12/2015  CLINICAL DATA:  Right-sided neck pain radiating to head. Posterior orbital pain for 7 days. EXAM: MR VENOGRAM WITHOUT CONTRAST MR CIRCLE OF WILLIS WITHOUT CONTRAST MRA OF THE NECK WITHOUT AND WITH CONTRAST TECHNIQUE: Angiographic images of the Circle of Willis were obtained using MRA technique without intravenous contrast.; Multiplanar and multiecho pulse sequences of the neck were obtained without and with intravenous contrast. Venographic images of the head were obtained without contrast. CONTRAST:  19mL MULTIHANCE GADOBENATE DIMEGLUMINE 529 MG/ML IV SOLN COMPARISON:  CTA head neck 09/09/2015. FINDINGS: MR VENOGRAM FINDINGS There is diminished flow related enhancement in the  RIGHT transverse sinus, sigmoid sinus, and internal jugular vein. Filling defects are seen within the sinus, consistent with thrombus. There is no thrombus in the superior sagittal sinus, or LEFT transverse sinus. 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 Findings are consistent with acute venous sinus thrombosis, as suspected from CT. Some residual flow can be seen within the RIGHT transverse, and sigmoid sinus, peripherally around the clock. MR CIRCLE OF WILLIS FINDINGS The internal carotid arteries are widely patent. The basilar artery is widely patent with the LEFT dominant. No proximal intracranial stenosis or aneurysm. MRA NECK FINDINGS Dolichoectasia.  Bovine trunk.  No proximal stenosis. Carotid bifurcations free of disease. No cervical ICA irregularity or dissection. LEFT vertebral dominant. Both contribute to formation of the basilar. Vertebral arteries show no ostial stenosis, or significant irregularity throughout their course through the neck. IMPRESSION: Negative MRA of the extracranial circulation and negative MRA of the intracranial circulation. MR venogram demonstrating findings consistent with acute RIGHT transverse and sigmoid sinus thrombosis, extending to the RIGHT jugular vein, with slight residual peripheral patency. Similar appearance to CT, when technique differences are considered. Electronically Signed   By: Elsie Stain M.D.   On: 09/12/2015 14:07   Mr Angiogram Neck W Wo Contrast  09/12/2015  CLINICAL DATA:  Right-sided neck pain radiating to head. Posterior orbital pain for 7 days. EXAM: MR VENOGRAM WITHOUT CONTRAST MR CIRCLE OF WILLIS WITHOUT CONTRAST MRA OF THE NECK WITHOUT AND WITH CONTRAST TECHNIQUE: Angiographic images of the Circle of Willis were obtained using MRA technique without intravenous contrast.; Multiplanar and multiecho pulse sequences of the neck were obtained without and with intravenous contrast. Venographic images of the head were obtained without contrast. CONTRAST:  19mL MULTIHANCE GADOBENATE DIMEGLUMINE 529 MG/ML IV SOLN COMPARISON:  CTA head neck 09/09/2015. FINDINGS: MR VENOGRAM FINDINGS There is diminished flow related enhancement in the RIGHT transverse sinus, sigmoid sinus, and internal jugular  vein. Filling defects are seen within the sinus, consistent with thrombus. There is no thrombus in the superior sagittal sinus, or LEFT transverse sinus. 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 Findings are consistent with acute venous sinus thrombosis, as suspected from CT. Some residual flow can be seen within the RIGHT transverse, and sigmoid sinus, peripherally around the clock. MR CIRCLE OF WILLIS FINDINGS The internal carotid arteries are widely patent. The basilar artery is widely patent with the LEFT dominant. No proximal intracranial stenosis or aneurysm. MRA NECK FINDINGS Dolichoectasia.  Bovine trunk.  No proximal stenosis. Carotid bifurcations free of disease. No cervical ICA irregularity or dissection. LEFT vertebral dominant. Both contribute to formation of the basilar. Vertebral arteries show no ostial stenosis, or significant irregularity throughout their course through the neck. IMPRESSION: Negative MRA of the extracranial circulation and negative MRA of the intracranial circulation. MR venogram demonstrating findings consistent with acute RIGHT transverse and sigmoid sinus thrombosis, extending to the RIGHT jugular vein, with slight residual peripheral patency. Similar appearance to CT, when technique differences are considered. Electronically Signed   By: Elsie Stain M.D.   On: 09/12/2015 14:07   Mr [place]  09/12/2015  CLINICAL DATA:  Right-sided neck pain radiating to head. Posterior orbital pain for 7 days. EXAM: MR VENOGRAM WITHOUT CONTRAST MR CIRCLE OF WILLIS WITHOUT CONTRAST MRA OF THE NECK WITHOUT AND WITH CONTRAST TECHNIQUE: Angiographic images of the Circle of Willis were obtained using MRA technique without intravenous contrast.; Multiplanar and multiecho pulse sequences of the neck were obtained without and with intravenous contrast.  Venographic images of the head were obtained without contrast. CONTRAST:  19mL MULTIHANCE GADOBENATE DIMEGLUMINE 529 MG/ML IV SOLN  COMPARISON:  CTA head neck 09/09/2015. FINDINGS: MR VENOGRAM FINDINGS There is diminished flow related enhancement in the RIGHT transverse sinus, sigmoid sinus, and internal jugular vein. Filling defects are seen within the sinus, consistent with thrombus. There is no thrombus in the superior sagittal sinus, or LEFT transverse sinus. 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 Findings are consistent with acute venous sinus thrombosis, as suspected from CT. Some residual flow can be seen within the RIGHT transverse, and sigmoid sinus, peripherally around the clock. MR CIRCLE OF WILLIS FINDINGS The internal carotid arteries are widely patent. The basilar artery is widely patent with the LEFT dominant. No proximal intracranial stenosis or aneurysm. MRA NECK FINDINGS Dolichoectasia.  Bovine trunk.  No proximal stenosis. Carotid bifurcations free of disease. No cervical ICA irregularity or dissection. LEFT vertebral dominant. Both contribute to formation of the basilar. Vertebral arteries show no ostial stenosis, or significant irregularity throughout their course through the neck. IMPRESSION: Negative MRA of the extracranial circulation and negative MRA of the intracranial circulation. MR venogram demonstrating findings consistent with acute RIGHT transverse and sigmoid sinus thrombosis, extending to the RIGHT jugular vein, with slight residual peripheral patency. Similar appearance to CT, when technique differences are considered. Electronically Signed   By: Elsie Stain M.D.   On: 09/12/2015 14:07    Medications:  I have reviewed the patient's current medications. Scheduled: . calcium carbonate  1 tablet Oral BID WC  . docusate sodium  100 mg Oral BID  . sodium chloride flush  3 mL Intravenous Q12H  . warfarin  10 mg Oral q1800  . Warfarin - Pharmacist Dosing Inpatient   Does not apply q1800    Assessment/Plan: Patient stable.  Coumadin started.  INR 1.06.  Recommendations: 1.  Will  continue to follow with you   LOS: 5 days   Thana Farr, MD Neurology [phone] 09/14/2015  12:02 PM

## 2015-09-14 NOTE — Progress Notes (Signed)
ANTICOAGULATION CONSULT NOTE - Initial Consult  Pharmacy Consult for Heparin Indication: stroke  No Known Allergies  Patient Measurements: Height:  (157.5 cm) Weight: 205 lb 12.8 oz (93.35 kg) IBW/kg (Calculated) : 50.1 Heparin Dosing Weight: 71 kg  Vital Signs: Temp: 97.7 F (36.5 C) (03/02 0601) Temp Source: Oral (03/02 0601) BP: 103/68 mmHg (03/02 0601) Pulse Rate: 80 (03/02 0601)  Labs:  Recent Labs  09/12/15 0515 09/13/15 0552 09/14/15 0517  HGB 13.8 13.5 13.4  HCT 41.9 40.1 40.1  PLT 182 160 166  LABPROT  --  13.6 14.0  INR  --  1.02 1.06  HEPARINUNFRC 0.39 0.32 0.41    Estimated Creatinine Clearance: 115.4 mL/min (by C-G formula based on Cr of 0.77).   Medical History: History reviewed. No pertinent past medical history.  Medications:  Scheduled:  . calcium carbonate  1 tablet Oral BID WC  . docusate sodium  100 mg Oral BID  . sodium chloride flush  3 mL Intravenous Q12H  . warfarin  10 mg Oral q1800  . Warfarin - Pharmacist Dosing Inpatient   Does not apply q1800   Infusions:  . sodium chloride 100 mL/hr at 09/13/15 2039  . heparin 1,250 Units/hr (09/13/15 1015)    Assessment: 25 y/o F with dural venous thrombosis. Currently on Heparin drip at 1250 units/hr.   2/27 0630 Heparin level resulted at 0.5 2/27 1311 Heparin level resulted at 0.37  Goal of Therapy:  Heparin level 0.3- 0.5 units/ml Monitor platelets by anticoagulation protocol: Yes   Plan:  Heparin level therapeutic. Continue current rate. Will check next Heparin level and CBC with am labs.   Osa Fogarty A. [place], [place].D., BCPS Clinical Pharmacist  09/14/2015 6:24 AM

## 2015-09-14 NOTE — Progress Notes (Signed)
[facility] [place] at [facility]   PATIENT NAME: Maria Brooks    MR#:  [id number]  DATE OF BIRTH:  01-03-1991  SUBJECTIVE:   no acute issues No headache or vision changes  REVIEW OF SYSTEMS:    Review of Systems  Constitutional: Negative for fever, chills and malaise/fatigue.  HENT: Negative for sore throat.   Eyes: Negative for blurred vision.  Respiratory: Negative for cough, hemoptysis, shortness of breath and wheezing.   Cardiovascular: Negative for chest pain, palpitations and leg swelling.  Gastrointestinal: Negative for nausea, vomiting, abdominal pain, diarrhea and blood in stool.  Genitourinary: Negative for dysuria.  Musculoskeletal: Negative for back pain.  Neurological: Negative for dizziness, tremors and headaches.  Endo/Heme/Allergies: Does not bruise/bleed easily.    Tolerating Diet: Yes      DRUG ALLERGIES:  No Known Allergies  VITALS:  Blood pressure 103/68, pulse 80, temperature 97.7 F (36.5 C), temperature source Oral, resp. rate 19, height 5\' 2"  (1.575 m), weight 93.895 kg (207 lb), last menstrual period 08/25/2015, SpO2 97 %.  PHYSICAL EXAMINATION:   Physical Exam  Constitutional: She is oriented to person, place, and time and well-developed, well-nourished, and in no distress. No distress.  HENT:  Head: Normocephalic.  Eyes: No scleral icterus.  Neck: Normal range of motion. Neck supple. No JVD present. No tracheal deviation present.  Cardiovascular: Normal rate, regular rhythm and normal heart sounds.  Exam reveals no gallop and no friction rub.   No murmur heard. Pulmonary/Chest: Effort normal and breath sounds normal. No respiratory distress. She has no wheezes. She has no rales. She exhibits no tenderness.  Abdominal: Soft. Bowel sounds are normal. She exhibits no distension and no mass. There is no tenderness. There is no rebound and no guarding.  Musculoskeletal: Normal range of motion. She exhibits no edema.   Neurological: She is alert and oriented to person, place, and time.  Skin: Skin is warm. No rash noted. No erythema.  Psychiatric: Affect and judgment normal.      LABORATORY PANEL:   CBC  Recent Labs Lab 09/14/15 0517  WBC 5.7  HGB 13.4  HCT 40.1  PLT 166   ------------------------------------------------------------------------------------------------------------------  Chemistries   Recent Labs Lab 09/09/15 0058  NA 139  K 3.7  CL 107  CO2 23  GLUCOSE 89  BUN 9  CREATININE 0.77  CALCIUM 8.8*   ------------------------------------------------------------------------------------------------------------------  Cardiac Enzymes No results for input(s): TROPONINI in the last 168 hours. ------------------------------------------------------------------------------------------------------------------  RADIOLOGY:  Mr Shirlee Latch Wo Contrast  09/12/2015  CLINICAL DATA:  Right-sided neck pain radiating to head. Posterior orbital pain for 7 days. EXAM: MR VENOGRAM WITHOUT CONTRAST MR CIRCLE OF WILLIS WITHOUT CONTRAST MRA OF THE NECK WITHOUT AND WITH CONTRAST TECHNIQUE: Angiographic images of the Circle of Willis were obtained using MRA technique without intravenous contrast.; Multiplanar and multiecho pulse sequences of the neck were obtained without and with intravenous contrast. Venographic images of the head were obtained without contrast. CONTRAST:  19mL MULTIHANCE GADOBENATE DIMEGLUMINE 529 MG/ML IV SOLN COMPARISON:  CTA head neck 09/09/2015. FINDINGS: MR VENOGRAM FINDINGS There is diminished flow related enhancement in the RIGHT transverse sinus, sigmoid sinus, and internal jugular vein. Filling defects are seen within the sinus, consistent with thrombus. There is no thrombus in the superior sagittal sinus, or LEFT transverse sinus. 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 Findings are consistent with acute venous sinus thrombosis, as suspected from CT. Some  residual flow can be seen within the  RIGHT transverse, and sigmoid sinus, peripherally around the clock. MR CIRCLE OF WILLIS FINDINGS The internal carotid arteries are widely patent. The basilar artery is widely patent with the LEFT dominant. No proximal intracranial stenosis or aneurysm. MRA NECK FINDINGS Dolichoectasia.  Bovine trunk.  No proximal stenosis. Carotid bifurcations free of disease. No cervical ICA irregularity or dissection. LEFT vertebral dominant. Both contribute to formation of the basilar. Vertebral arteries show no ostial stenosis, or significant irregularity throughout their course through the neck. IMPRESSION: Negative MRA of the extracranial circulation and negative MRA of the intracranial circulation. MR venogram demonstrating findings consistent with acute RIGHT transverse and sigmoid sinus thrombosis, extending to the RIGHT jugular vein, with slight residual peripheral patency. Similar appearance to CT, when technique differences are considered. Electronically Signed   By: Elsie Stain M.D.   On: 09/12/2015 14:07   Mr Angiogram Neck W Wo Contrast  09/12/2015  CLINICAL DATA:  Right-sided neck pain radiating to head. Posterior orbital pain for 7 days. EXAM: MR VENOGRAM WITHOUT CONTRAST MR CIRCLE OF WILLIS WITHOUT CONTRAST MRA OF THE NECK WITHOUT AND WITH CONTRAST TECHNIQUE: Angiographic images of the Circle of Willis were obtained using MRA technique without intravenous contrast.; Multiplanar and multiecho pulse sequences of the neck were obtained without and with intravenous contrast. Venographic images of the head were obtained without contrast. CONTRAST:  19mL MULTIHANCE GADOBENATE DIMEGLUMINE 529 MG/ML IV SOLN COMPARISON:  CTA head neck 09/09/2015. FINDINGS: MR VENOGRAM FINDINGS There is diminished flow related enhancement in the RIGHT transverse sinus, sigmoid sinus, and internal jugular vein. Filling defects are seen within the sinus, consistent with thrombus. There is no thrombus  in the superior sagittal sinus, or LEFT transverse sinus. 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 Findings are consistent with acute venous sinus thrombosis, as suspected from CT. Some residual flow can be seen within the RIGHT transverse, and sigmoid sinus, peripherally around the clock. MR CIRCLE OF WILLIS FINDINGS The internal carotid arteries are widely patent. The basilar artery is widely patent with the LEFT dominant. No proximal intracranial stenosis or aneurysm. MRA NECK FINDINGS Dolichoectasia.  Bovine trunk.  No proximal stenosis. Carotid bifurcations free of disease. No cervical ICA irregularity or dissection. LEFT vertebral dominant. Both contribute to formation of the basilar. Vertebral arteries show no ostial stenosis, or significant irregularity throughout their course through the neck. IMPRESSION: Negative MRA of the extracranial circulation and negative MRA of the intracranial circulation. MR venogram demonstrating findings consistent with acute RIGHT transverse and sigmoid sinus thrombosis, extending to the RIGHT jugular vein, with slight residual peripheral patency. Similar appearance to CT, when technique differences are considered. Electronically Signed   By: Elsie Stain M.D.   On: 09/12/2015 14:07   Mr [place]  09/12/2015  CLINICAL DATA:  Right-sided neck pain radiating to head. Posterior orbital pain for 7 days. EXAM: MR VENOGRAM WITHOUT CONTRAST MR CIRCLE OF WILLIS WITHOUT CONTRAST MRA OF THE NECK WITHOUT AND WITH CONTRAST TECHNIQUE: Angiographic images of the Circle of Willis were obtained using MRA technique without intravenous contrast.; Multiplanar and multiecho pulse sequences of the neck were obtained without and with intravenous contrast. Venographic images of the head were obtained without contrast. CONTRAST:  19mL MULTIHANCE GADOBENATE DIMEGLUMINE 529 MG/ML IV SOLN COMPARISON:  CTA head neck 09/09/2015. FINDINGS: MR VENOGRAM FINDINGS There is diminished flow  related enhancement in the RIGHT transverse sinus, sigmoid sinus, and internal jugular vein. Filling defects are seen within the sinus, consistent with thrombus. There is no thrombus in  the superior sagittal sinus, or LEFT transverse sinus. 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 Findings are consistent with acute venous sinus thrombosis, as suspected from CT. Some residual flow can be seen within the RIGHT transverse, and sigmoid sinus, peripherally around the clock. MR CIRCLE OF WILLIS FINDINGS The internal carotid arteries are widely patent. The basilar artery is widely patent with the LEFT dominant. No proximal intracranial stenosis or aneurysm. MRA NECK FINDINGS Dolichoectasia.  Bovine trunk.  No proximal stenosis. Carotid bifurcations free of disease. No cervical ICA irregularity or dissection. LEFT vertebral dominant. Both contribute to formation of the basilar. Vertebral arteries show no ostial stenosis, or significant irregularity throughout their course through the neck. IMPRESSION: Negative MRA of the extracranial circulation and negative MRA of the intracranial circulation. MR venogram demonstrating findings consistent with acute RIGHT transverse and sigmoid sinus thrombosis, extending to the RIGHT jugular vein, with slight residual peripheral patency. Similar appearance to CT, when technique differences are considered. Electronically Signed   By: Elsie Stain M.D.   On: 09/12/2015 14:07     ASSESSMENT AND PLAN:   25 year old female admitted for dural venous thrombus. 1. Dural venous thrombosis:  Repeat MRI shows stable thrombus no evidence of extension Continue heparin and Coumadin. Once INR is greater than 2 then discontinue heparin. Goal INR between 2 and 3 Her coagulable workup was ordered and will need to be followed up as outpatient.  Discussed with patient to avoid estrogen products.   2. Obesity: BMI is 34.8; encouraged healthy diet and exercise      Management  plans discussed with the patient and she is in agreement.  CODE STATUS: FULL  TOTAL TIME TAKING CARE OF THIS PATIENT: 21 minutes.     POSSIBLE D/C 3-4 days, DEPENDING ON INR  Fajr Fife M.D on 09/14/2015 at 10:43 AM  Between 7am to 6pm - Pager - [phone] After 6pm go to [email or web address] - password [email or web address] [facility]  [facility]  Office  [phone]  CC: Primary care physician; No PCP Per Patient  Note: This dictation was prepared with Dragon dictation along with smaller phrase technology. Any transcriptional errors that result from this process are unintentional.

## 2015-09-15 LAB — CBC
HCT: 40.5 % (ref 35.0–47.0)
Hemoglobin: 13.6 g/dL (ref 12.0–16.0)
MCH: 29.6 pg (ref 26.0–34.0)
MCHC: 33.6 g/dL (ref 32.0–36.0)
MCV: 88.2 fL (ref 80.0–100.0)
PLATELETS: 167 10*3/uL (ref 150–440)
RBC: 4.6 MIL/uL (ref 3.80–5.20)
RDW: 14.4 % (ref 11.5–14.5)
WBC: 5.6 10*3/uL (ref 3.6–11.0)

## 2015-09-15 LAB — PROTIME-INR
INR: 1.52
PROTHROMBIN TIME: 18.4 s — AB (ref 11.4–15.0)

## 2015-09-15 LAB — HEPARIN LEVEL (UNFRACTIONATED): Heparin Unfractionated: 0.35 IU/mL (ref 0.30–0.70)

## 2015-09-15 MED ORDER — WARFARIN SODIUM 10 MG PO TABS
5.0000 mg | ORAL_TABLET | Freq: Every day | ORAL | Status: DC
  Administered 2015-09-15 – 2015-09-16 (×2): 5 mg via ORAL
  Filled 2015-09-15 (×2): qty 1

## 2015-09-15 NOTE — Progress Notes (Signed)
Subjective: Patient remains stable.  No new complaints  Objective: Current vital signs: BP 111/65 mmHg  Pulse 86  Temp(Src) 98.3 F (36.8 C) (Oral)  Resp 19  Ht 5\' 2"  (1.575 m)  Wt 90.901 kg (200 lb 6.4 oz)  BMI 36.64 kg/m2  SpO2 98%  LMP 08/25/2015 (Approximate) Vital signs in last 24 hours: Temp:  [98.1 F (36.7 C)-98.7 F (37.1 C)] 98.3 F (36.8 C) (03/03 1318) Pulse Rate:  [78-88] 86 (03/03 1318) Resp:  [19-20] 19 (03/03 1318) BP: (103-111)/(63-70) 111/65 mmHg (03/03 1318) SpO2:  [98 %-99 %] 98 % (03/03 1318) Weight:  [90.901 kg (200 lb 6.4 oz)] 90.901 kg (200 lb 6.4 oz) (03/03 0500)  Intake/Output from previous day: 03/02 0701 - 03/03 0700 In: -  Out: 1 [Urine:1] Intake/Output this shift:   Nutritional status: Diet Heart Room service appropriate?: Yes; Fluid consistency:: Thin  Neurologic Exam: Mental Status: Alert, oriented, thought content appropriate. Speech fluent without evidence of aphasia. Able to follow 3 step commands without difficulty. Cranial Nerves: II: Discs flat bilaterally; Visual fields grossly normal, pupils equal, round, reactive to light and accommodation III,IV, VI: ptosis not present, extra-ocular motions intact bilaterally V,VII: smile symmetric, facial light touch sensation normal bilaterally VIII: hearing normal bilaterally IX,X: gag reflex present XI: bilateral shoulder shrug XII: left tongue deviation Motor: Right :Upper extremity 5/5Left: Upper extremity 5/5 Lower extremity 5/5Lower extremity 5/5 Tone and bulk:normal tone throughout; no atrophy noted  Lab Results: Basic Metabolic Panel:  Recent Labs Lab 09/09/15 0058  NA 139  K 3.7  CL 107  CO2 23  GLUCOSE 89  BUN 9  CREATININE 0.77  CALCIUM 8.8*    Liver Function Tests: No results for input(s): AST, ALT, ALKPHOS, BILITOT, PROT, ALBUMIN in the last 168  hours. No results for input(s): LIPASE, AMYLASE in the last 168 hours. No results for input(s): AMMONIA in the last 168 hours.  CBC:  Recent Labs Lab 09/09/15 0058  09/11/15 0634 09/12/15 0515 09/13/15 0552 09/14/15 0517 09/15/15 0539  WBC 8.3  < > 4.8 5.4 5.6 5.7 5.6  NEUTROABS 6.3  --   --   --   --   --   --   HGB 14.9  < > 13.4 13.8 13.5 13.4 13.6  HCT 44.2  < > 39.5 41.9 40.1 40.1 40.5  MCV 89.3  < > 89.7 88.9 89.2 90.4 88.2  PLT 192  < > 150 182 160 166 167  < > = values in this interval not displayed.  Cardiac Enzymes: No results for input(s): CKTOTAL, CKMB, CKMBINDEX, TROPONINI in the last 168 hours.  Lipid Panel: No results for input(s): CHOL, TRIG, HDL, CHOLHDL, VLDL, LDLCALC in the last 168 hours.  CBG: No results for input(s): GLUCAP in the last 168 hours.  Microbiology: No results found for this or any previous visit.  Coagulation Studies:  Recent Labs  09/13/15 0552 09/14/15 0517 09/15/15 0539  LABPROT 13.6 14.0 18.4*  INR 1.02 1.06 1.52    Imaging: No results found.  Medications:  I have reviewed the patient's current medications. Scheduled: . calcium carbonate  1 tablet Oral BID WC  . docusate sodium  100 mg Oral BID  . sodium chloride flush  3 mL Intravenous Q12H  . warfarin  5 mg Oral q1800  . Warfarin - Pharmacist Dosing Inpatient   Does not apply q1800    Assessment/Plan: INR 1.52.    Recommendations: 1.  OOB walking the hallway daily 2.  INR goal  2-3.     LOS: 6 days   Thana Farr, MD Neurology [phone] 09/15/2015  3:09 PM

## 2015-09-15 NOTE — Progress Notes (Signed)
ANTICOAGULATION CONSULT NOTE - Initial Consult  Pharmacy Consult for warfarin Indication: Dural Venous Thrombis  No Known Allergies  Patient Measurements: Height: 5\' 2"  (157.5 cm) Weight: 200 lb 6.4 oz (90.901 kg) IBW/kg (Calculated) : 50.1 Heparin Dosing Weight: na  Vital Signs: Temp: 98.1 F (36.7 C) (03/03 0612) Temp Source: Oral (03/03 0612) BP: 103/63 mmHg (03/03 0612) Pulse Rate: 78 (03/03 0612)  Labs:  Recent Labs  09/13/15 0552 09/14/15 0517 09/15/15 0539  HGB 13.5 13.4 13.6  HCT 40.1 40.1 40.5  PLT 160 166 167  LABPROT 13.6 14.0 18.4*  INR 1.02 1.06 1.52  HEPARINUNFRC 0.32 0.41 0.35    Estimated Creatinine Clearance: 113.7 mL/min (by C-G formula based on Cr of 0.77).   Medical History: History reviewed. No pertinent past medical history.  Medications:  Scheduled:  . calcium carbonate  1 tablet Oral BID WC  . docusate sodium  100 mg Oral BID  . sodium chloride flush  3 mL Intravenous Q12H  . warfarin  5 mg Oral q1800  . Warfarin - Pharmacist Dosing Inpatient   Does not apply q1800   Infusions:  . sodium chloride 100 mL/hr at 09/15/15 0230  . 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 Patient is currently being bridged on heparin drip and is on day 3 of warfarin.  Goal of Therapy:  INR 2-3 Monitor platelets by anticoagulation protocol: Yes   Plan:  INR increased to 1.52 today. Will decrease dose to warfarin 5mg  daily. Will obtain follow-up INR with am labs.   Patient will need to bridged for minimum of 5 days. Recommend continuing bridging for two INRs > 2.   Pharmacy will continue to monitor and adjust per consult.   Charlies SilversMichael Leylanie Woodmansee 09/15/2015 9:31 AM

## 2015-09-15 NOTE — Progress Notes (Signed)
ANTICOAGULATION CONSULT NOTE - Initial Consult  Pharmacy Consult for Heparin Indication: stroke  No Known Allergies  Patient Measurements: Height: 5\' 2"  (157.5 cm) Weight: 200 lb 6.4 oz (90.901 kg) IBW/kg (Calculated) : 50.1 Heparin Dosing Weight: 71 kg  Vital Signs: Temp: 98.1 F (36.7 C) (03/03 0612) Temp Source: Oral (03/03 0612) BP: 103/63 mmHg (03/03 0612) Pulse Rate: 78 (03/03 0612)  Labs:  Recent Labs  09/13/15 0552 09/14/15 0517 09/15/15 0539  HGB 13.5 13.4 13.6  HCT 40.1 40.1 40.5  PLT 160 166 167  LABPROT 13.6 14.0 18.4*  INR 1.02 1.06 1.52  HEPARINUNFRC 0.32 0.41 0.35    Estimated Creatinine Clearance: 113.7 mL/min (by C-G formula based on Cr of 0.77).   Medical History: History reviewed. No pertinent past medical history.  Medications:  Scheduled:  . calcium carbonate  1 tablet Oral BID WC  . docusate sodium  100 mg Oral BID  . sodium chloride flush  3 mL Intravenous Q12H  . warfarin  10 mg Oral q1800  . Warfarin - Pharmacist Dosing Inpatient   Does not apply q1800   Infusions:  . sodium chloride 100 mL/hr at 09/15/15 0230  . 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 Currently on Heparin drip at 1250 units/hr.   2/27 0630 Heparin level resulted at 0.5 2/27 1311 Heparin level resulted at 0.37  Goal of Therapy:  Heparin level 0.3- 0.5 units/ml Monitor platelets by anticoagulation protocol: Yes   Plan:  Heparin level therapeutic. Continue current rate. Will check next Heparin level and CBC with am labs.   Jacques Willingham A. Beardenookson, [place]., BCPS Clinical Pharmacist  09/15/2015 7:27 AM

## 2015-09-15 NOTE — Progress Notes (Signed)
[facility] - [place] at [facility]   PATIENT NAME: Maria Brooks    MR#:  [id number]  DATE OF BIRTH:  Dec 13, 1990  SUBJECTIVE:  No acute issues overnight. Patient is doing well. INR level is still not therapeutic. REVIEW OF SYSTEMS:    Review of Systems  Constitutional: Negative for fever, chills and malaise/fatigue.  HENT: Negative for sore throat.   Eyes: Negative for blurred vision.  Respiratory: Negative for cough, hemoptysis, shortness of breath and wheezing.   Cardiovascular: Negative for chest pain, palpitations and leg swelling.  Gastrointestinal: Negative for nausea, vomiting, abdominal pain, diarrhea and blood in stool.  Genitourinary: Negative for dysuria.  Musculoskeletal: Negative for back pain.  Neurological: Negative for dizziness, tremors and headaches.  Endo/Heme/Allergies: Does not bruise/bleed easily.    Tolerating Diet: Yes      DRUG ALLERGIES:  No Known Allergies  VITALS:  Blood pressure 103/63, pulse 78, temperature 98.1 F (36.7 C), temperature source Oral, resp. rate 20, height  (1.575 m), weight 90.901 kg (200 lb 6.4 oz), last menstrual period 08/25/2015, SpO2 99 %.  PHYSICAL EXAMINATION:   Physical Exam  Constitutional: She is oriented to person, place, and time and well-developed, well-nourished, and in no distress. No distress.  HENT:  Head: Normocephalic.  Eyes: No scleral icterus.  Neck: Normal range of motion. Neck supple. No JVD present. No tracheal deviation present.  Cardiovascular: Normal rate, regular rhythm and normal heart sounds.  Exam reveals no gallop and no friction rub.   No murmur heard. Pulmonary/Chest: Effort normal and breath sounds normal. No respiratory distress. She has no wheezes. She has no rales. She exhibits no tenderness.  Abdominal: Soft. Bowel sounds are normal. She exhibits no distension and no mass. There is no tenderness. There is no rebound and no guarding.  Musculoskeletal:  Normal range of motion. She exhibits no edema.  Neurological: She is alert and oriented to person, place, and time.  Skin: Skin is warm. No rash noted. No erythema.  Psychiatric: Affect and judgment normal.      LABORATORY PANEL:   CBC  Recent Labs Lab 09/15/15 0539  WBC 5.6  HGB 13.6  HCT 40.5  PLT 167   ------------------------------------------------------------------------------------------------------------------  Chemistries   Recent Labs Lab 09/09/15 0058  NA 139  K 3.7  CL 107  CO2 23  GLUCOSE 89  BUN 9  CREATININE 0.77  CALCIUM 8.8*   ------------------------------------------------------------------------------------------------------------------  Cardiac Enzymes No results for input(s): TROPONINI in the last 168 hours. ------------------------------------------------------------------------------------------------------------------  RADIOLOGY:  No results found.   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 1. Dural venous thrombosis:  Repeat MRI shows stable thrombus no evidence of extension Continue heparin and Coumadin. Once INR is greater than 2 then discontinue heparin. Goal INR between 2 and 3 Her coagulable workup was ordered and will need to be followed up as outpatient.  Discussed with patient to avoid estrogen products.   2. Obesity: BMI is 34.8; encouraged healthy diet and exercise      Management plans discussed with the patient and she is in agreement.  CODE STATUS: FULL  TOTAL TIME TAKING CARE OF THIS PATIENT: 23 minutes.     POSSIBLE D/C 3-4 days, DEPENDING ON INR  Cameka Rae M.D on 09/15/2015 at 10:15 AM  Between 7am to 6pm - Pager - [phone] After 6pm go to [email or web address] - password [email or web address] [facility]  [facility]  Office  [phone]  CC: Primary care physician; No  PCP Per Patient  Note: This dictation was prepared with Dragon dictation along with smaller phrase  technology. Any transcriptional errors that result from this process are unintentional.

## 2015-09-16 LAB — BASIC METABOLIC PANEL
ANION GAP: 8 (ref 5–15)
BUN: 10 mg/dL (ref 6–20)
CHLORIDE: 109 mmol/L (ref 101–111)
CO2: 24 mmol/L (ref 22–32)
Calcium: 8.6 mg/dL — ABNORMAL LOW (ref 8.9–10.3)
Creatinine, Ser: 0.75 mg/dL (ref 0.44–1.00)
GFR calc non Af Amer: >60 mL/min (ref >60)
Glucose, Bld: 82 mg/dL (ref 65–99)
POTASSIUM: 3.7 mmol/L (ref 3.5–5.1)
Sodium: 141 mmol/L (ref 135–145)

## 2015-09-16 LAB — CBC
HEMATOCRIT: 44.3 % (ref 35.0–47.0)
HEMOGLOBIN: 14.8 g/dL (ref 12.0–16.0)
MCH: 30.3 pg (ref 26.0–34.0)
MCHC: 33.4 g/dL (ref 32.0–36.0)
MCV: 90.9 fL (ref 80.0–100.0)
Platelets: 167 10*3/uL (ref 150–440)
RBC: 4.88 MIL/uL (ref 3.80–5.20)
RDW: 14.6 % — ABNORMAL HIGH (ref 11.5–14.5)
WBC: 5.5 10*3/uL (ref 3.6–11.0)

## 2015-09-16 LAB — HEPARIN LEVEL (UNFRACTIONATED): HEPARIN UNFRACTIONATED: 0.41 [IU]/mL (ref 0.30–0.70)

## 2015-09-16 LAB — PROTIME-INR
INR: 1.7
Prothrombin Time: 20 seconds — ABNORMAL HIGH (ref 11.4–15.0)

## 2015-09-16 NOTE — Progress Notes (Signed)
Took over care at 15:00, pt is alert and oriented, resting in bed, denies pain, heparin drip infusing, uneventful shift.

## 2015-09-16 NOTE — Progress Notes (Addendum)
Pt alert and oriented x4, no complaints of pain or discomfort.  Bed in low position, call bell within reach.  Bed alarms on and functioning.  Assessment done and charted.  Will continue to monitor and do hourly rounding throughout the shift 

## 2015-09-16 NOTE — Progress Notes (Signed)
ANTICOAGULATION CONSULT NOTE - Initial Consult  Pharmacy Consult for Heparin Indication: stroke  No Known Allergies  Patient Measurements: Height: 5\' 2"  (157.5 cm) Weight: 201 lb 12.8 oz (91.536 kg) IBW/kg (Calculated) : 50.1 Heparin Dosing Weight: 71 kg  Vital Signs: Temp: 97.5 F (36.4 C) (03/03 2222) Temp Source: Oral (03/03 2222) BP: 106/72 mmHg (03/04 0546) Pulse Rate: 76 (03/04 0546)  Labs:  Recent Labs  09/14/15 0517 09/15/15 0539 09/16/15 0506  HGB 13.4 13.6 14.8  HCT 40.1 40.5 44.3  PLT 166 167 167  LABPROT 14.0 18.4* 20.0*  INR 1.06 1.52 1.70  HEPARINUNFRC 0.41 0.35 0.41    Estimated Creatinine Clearance: 114.2 mL/min (by C-G formula based on Cr of 0.77).   Medical History: History reviewed. No pertinent past medical history.  Medications:  Scheduled:  . calcium carbonate  1 tablet Oral BID WC  . docusate sodium  100 mg Oral BID  . sodium chloride flush  3 mL Intravenous Q12H  . warfarin  5 mg Oral q1800  . Warfarin - Pharmacist Dosing Inpatient   Does not apply q1800   Infusions:  . 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 Currently on Heparin drip at 1250 units/hr.   2/27 0630 Heparin level resulted at 0.5 2/27 1311 Heparin level resulted at 0.37  Goal of Therapy:  Heparin level 0.3- 0.5 units/ml Monitor platelets by anticoagulation protocol: Yes   Plan:  Heparin level therapeutic. Continue current rate. Will check next Heparin level and CBC with am labs.   Aleyah Balik A. River Fallsookson, [place]arm[place]D., BCPS Clinical Pharmacist  09/16/2015 6:31 AM

## 2015-09-16 NOTE — Progress Notes (Signed)
ANTICOAGULATION CONSULT NOTE -FOLLOW UP  Pharmacy Consult for warfarin Indication: Dural Venous Thrombis  No Known Allergies  Patient Measurements: Height: 5\' 2"  (157.5 cm) Weight: 201 lb 12.8 oz (91.536 kg) IBW/kg (Calculated) : 50.1 Heparin Dosing Weight: na  Vital Signs: Temp: 98.1 F (36.7 C) (03/04 0546) Temp Source: Oral (03/04 0546) BP: 106/72 mmHg (03/04 0546) Pulse Rate: 76 (03/04 0546)  Labs:  Recent Labs  09/14/15 0517 09/15/15 0539 09/16/15 0506  HGB 13.4 13.6 14.8  HCT 40.1 40.5 44.3  PLT 166 167 167  LABPROT 14.0 18.4* 20.0*  INR 1.06 1.52 1.70  HEPARINUNFRC 0.41 0.35 0.41  CREATININE  --   --  0.75    Estimated Creatinine Clearance: 114.2 mL/min (by C-G formula based on Cr of 0.75).   Medical History: History reviewed. No pertinent past medical history.  Medications:  Scheduled:  . calcium carbonate  1 tablet Oral BID WC  . docusate sodium  100 mg Oral BID  . sodium chloride flush  3 mL Intravenous Q12H  . warfarin  5 mg Oral q1800  . Warfarin - Pharmacist Dosing Inpatient   Does not apply q1800   Infusions:  . 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 Patient is currently being bridged on heparin drip and is on day 4 of warfarin.  3/1: INR: 1.02; 10 mg 3/2: INR: 1.06; 10 mg  3/3: INR: 1.52; 5 mg  3/4: INR: 1.70 Goal of Therapy:  INR 2-3 Monitor platelets by anticoagulation protocol: Yes   Plan:  INR increasing. Will continue warfarin 5 mg PO daily. PT/INR with am labs.   Patient will need to bridged for minimum of 5 days. Recommend continuing bridging for two INRs > 2.   Pharmacy will continue to monitor and adjust per consult.   Demetrius Charity.Takiya Belmares D. Kazmir Oki, PharmD   09/16/2015 8:57 AM

## 2015-09-16 NOTE — Progress Notes (Signed)
[facility] - [place] at [facility]   PATIENT NAME: Maria Brooks    MR#:  [id number]  DATE OF BIRTH:  07-Dec-1990  SUBJECTIVE:  Up and walking. On and off mild headache. No focal weakness. No bleeding  REVIEW OF SYSTEMS:    Review of Systems  Constitutional: Negative for fever, chills and malaise/fatigue.  HENT: Negative for sore throat.   Eyes: Negative for blurred vision.  Respiratory: Negative for cough, hemoptysis, shortness of breath and wheezing.   Cardiovascular: Negative for chest pain, palpitations and leg swelling.  Gastrointestinal: Negative for nausea, vomiting, abdominal pain, diarrhea and blood in stool.  Genitourinary: Negative for dysuria.  Musculoskeletal: Negative for back pain.  Neurological: Negative for dizziness, tremors and headaches.  Endo/Heme/Allergies: Does not bruise/bleed easily.   Tolerating Diet: Yes  DRUG ALLERGIES:  No Known Allergies  VITALS:  Blood pressure 106/72, pulse 76, temperature 98.1 F (36.7 C), temperature source Oral, resp. rate 18, height 5\' 2"  (1.575 m), weight 91.536 kg (201 lb 12.8 oz), last menstrual period 08/25/2015, SpO2 98 %.  PHYSICAL EXAMINATION:   Physical Exam  Constitutional: She is oriented to person, place, and time and well-developed, well-nourished, and in no distress. No distress.  HENT:  Head: Normocephalic.  Eyes: No scleral icterus.  Neck: Normal range of motion. Neck supple. No JVD present. No tracheal deviation present.  Cardiovascular: Normal rate, regular rhythm and normal heart sounds.  Exam reveals no gallop and no friction rub.   No murmur heard. Pulmonary/Chest: Effort normal and breath sounds normal. No respiratory distress. She has no wheezes. She has no rales. She exhibits no tenderness.  Abdominal: Soft. Bowel sounds are normal. She exhibits no distension and no mass. There is no tenderness. There is no rebound and no guarding.  Musculoskeletal: Normal range of  motion. She exhibits no edema.  Neurological: She is alert and oriented to person, place, and time.  Skin: Skin is warm. No rash noted. No erythema.  Psychiatric: Affect and judgment normal.   LABORATORY PANEL:   CBC  Recent Labs Lab 09/16/15 0506  WBC 5.5  HGB 14.8  HCT 44.3  PLT 167   ------------------------------------------------------------------------------------------------------------------  Chemistries   Recent Labs Lab 09/16/15 0506  NA 141  K 3.7  CL 109  CO2 24  GLUCOSE 82  BUN 10  CREATININE 0.75  CALCIUM 8.6*   ------------------------------------------------------------------------------------------------------------------  Cardiac Enzymes No results for input(s): TROPONINI in the last 168 hours. ------------------------------------------------------------------------------------------------------------------  RADIOLOGY:  No results found.   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 1. Dural venous thrombosis Repeat MRI shows stable thrombus no evidence of extension. Continue heparin and Coumadin. INR trending up slowly. Today at 1.7 Her coagulable workup was ordered and will need to be followed up as outpatient.  Avoid estrogen products.  2. Obesity: BMI is 34.8; encouraged healthy diet and exercise this admission   Management plans discussed with the patient and she is in agreement.  CODE STATUS: FULL  TOTAL TIME TAKING CARE OF THIS PATIENT: 23 minutes.    Possible d/c in AM  Milagros LollSudini, Cathyrn Deas R M.D on 09/16/2015 at 12:14 PM  Between 7am to 6pm - Pager - [phone]  After 6pm go to [email or web address] - password [email or web address] [facility]  [facility]  Office  [phone]  CC: Primary care physician; No PCP Per Patient  Note: This dictation was prepared with Dragon dictation along with smaller phrase technology. Any transcriptional errors that result from this process  are unintentional.

## 2015-09-17 LAB — HEPARIN LEVEL (UNFRACTIONATED)
HEPARIN UNFRACTIONATED: 0.52 [IU]/mL (ref 0.30–0.70)
Heparin Unfractionated: 0.54 IU/mL (ref 0.30–0.70)
Heparin Unfractionated: 0.41 IU/mL (ref 0.30–0.70)

## 2015-09-17 LAB — CBC
HCT: 44.2 % (ref 35.0–47.0)
Hemoglobin: 14.6 g/dL (ref 12.0–16.0)
MCH: 29.4 pg (ref 26.0–34.0)
MCHC: 33.1 g/dL (ref 32.0–36.0)
MCV: 88.9 fL (ref 80.0–100.0)
PLATELETS: 175 10*3/uL (ref 150–440)
RBC: 4.97 MIL/uL (ref 3.80–5.20)
RDW: 14.7 % — AB (ref 11.5–14.5)
WBC: 6.5 10*3/uL (ref 3.6–11.0)

## 2015-09-17 LAB — PROTIME-INR
INR: 1.74
PROTHROMBIN TIME: 20.3 s — AB (ref 11.4–15.0)

## 2015-09-17 MED ORDER — WARFARIN SODIUM 7.5 MG PO TABS
7.5000 mg | ORAL_TABLET | Freq: Every day | ORAL | Status: DC
  Administered 2015-09-17: 7.5 mg via ORAL
  Filled 2015-09-17: qty 1

## 2015-09-17 NOTE — Progress Notes (Signed)
ANTICOAGULATION CONSULT NOTE - FOLLOW UP   Pharmacy Consult for Heparin Indication: stroke  No Known Allergies  Patient Measurements: Height: 5\' 2"  (157.5 cm) Weight: 201 lb 12.8 oz (91.536 kg) IBW/kg (Calculated) : 50.1 Heparin Dosing Weight: 71 kg  Vital Signs: Temp: 98.2 F (36.8 C) (03/05 0503) Temp Source: Oral (03/05 0503) BP: 104/63 mmHg (03/05 0503) Pulse Rate: 88 (03/05 0503)  Labs:  Recent Labs  09/15/15 0539 09/16/15 0506 09/17/15 0547 09/17/15 1259  HGB 13.6 14.8 14.6  --   HCT 40.5 44.3 44.2  --   PLT 167 167 175  --   LABPROT 18.4* 20.0* 20.3*  --   INR 1.52 1.70 1.74  --   HEPARINUNFRC 0.35 0.41 0.54 0.52  CREATININE  --  0.75  --   --     Estimated Creatinine Clearance: 114.2 mL/min (by C-G formula based on Cr of 0.75).   Medical History: History reviewed. No pertinent past medical history.  Medications:  Scheduled:  . calcium carbonate  1 tablet Oral BID WC  . docusate sodium  100 mg Oral BID  . sodium chloride flush  3 mL Intravenous Q12H  . warfarin  7.5 mg Oral q1800  . Warfarin - Pharmacist Dosing Inpatient   Does not apply q1800   Infusions:  . 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 Currently on Heparin drip at 1200 units/hr.   2/27 0630 Heparin level resulted at 0.5 2/27 1311 Heparin level resulted at 0.37  Goal of Therapy:  Heparin level 0.3- 0.5 units/ml Monitor platelets by anticoagulation protocol: Yes   Plan:  Heparin level 0.52. Reduce rate to 1150 units/hr. Will recheck heparin level in 6 hours.   Demetrius Charityeldrin D. Ronnisha Felber, PharmD  Clinical Pharmacist  09/17/2015 1:35 PM

## 2015-09-17 NOTE — Progress Notes (Signed)
[facility] [place] at [facility]   PATIENT NAME: Maria Brooks    MR#:  [id number]  DATE OF BIRTH:  February 09, 1991  SUBJECTIVE:  Up and walking. On and off mild headache.No focal weakness. No bleeding . On heparin drip. REVIEW OF SYSTEMS:    Review of Systems  Constitutional: Negative for fever, chills and malaise/fatigue.  HENT: Negative for sore throat.   Eyes: Negative for blurred vision.  Respiratory: Negative for cough, hemoptysis, shortness of breath and wheezing.   Cardiovascular: Negative for chest pain, palpitations and leg swelling.  Gastrointestinal: Negative for nausea, vomiting, abdominal pain, diarrhea and blood in stool.  Genitourinary: Negative for dysuria.  Musculoskeletal: Negative for back pain.  Neurological: Negative for dizziness, tremors and headaches.  Endo/Heme/Allergies: Does not bruise/bleed easily.   Tolerating Diet: Yes  DRUG ALLERGIES:  No Known Allergies  VITALS:  Blood pressure 104/63, pulse 88, temperature 98.2 F (36.8 C), temperature source Oral, resp. rate 18, height 5\' 2"  (1.575 m), weight 91.536 kg (201 lb 12.8 oz), last menstrual period 08/25/2015, SpO2 97 %.  PHYSICAL EXAMINATION:   Physical Exam  Constitutional: She is oriented to person, place, and time and well-developed, well-nourished, and in no distress. No distress.  HENT:  Head: Normocephalic.  Eyes: No scleral icterus.  Neck: Normal range of motion. Neck supple. No JVD present. No tracheal deviation present.  Cardiovascular: Normal rate, regular rhythm and normal heart sounds.  Exam reveals no gallop and no friction rub.   No murmur heard. Pulmonary/Chest: Effort normal and breath sounds normal. No respiratory distress. She has no wheezes. She has no rales. She exhibits no tenderness.  Abdominal: Soft. Bowel sounds are normal. She exhibits no distension and no mass. There is no tenderness. There is no rebound and no guarding.  Musculoskeletal: Normal  range of motion. She exhibits no edema.  Neurological: She is alert and oriented to person, place, and time.  Skin: Skin is warm. No rash noted. No erythema.  Psychiatric: Affect and judgment normal.   LABORATORY PANEL:   CBC  Recent Labs Lab 09/17/15 0547  WBC 6.5  HGB 14.6  HCT 44.2  PLT 175   ------------------------------------------------------------------------------------------------------------------  Chemistries   Recent Labs Lab 09/16/15 0506  NA 141  K 3.7  CL 109  CO2 24  GLUCOSE 82  BUN 10  CREATININE 0.75  CALCIUM 8.6*   ------------------------------------------------------------------------------------------------------------------  Cardiac Enzymes No results for input(s): TROPONINI in the last 168 hours. ------------------------------------------------------------------------------------------------------------------  RADIOLOGY:  No results found.   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 1. Dural venous thrombosis Repeat MRI shows stable thrombus no evidence of extension. Continue heparin and Coumadin. INR trending up slowly. Today at 1.74 Her coagulable workup was ordered and will need to be followed up as outpatient.  Avoid estrogen products.  2. Obesity: BMI is 34.8; encouraged healthy diet and exercise this admission   Management plans discussed with the patient and she is in agreement.  CODE STATUS: FULL  TOTAL TIME TAKING CARE OF THIS PATIENT: 23 minutes.   Discharge when INR between 2 and 3.  Milagros LollSudini, Makaiyah Schweiger R M.D on 09/17/2015 at 11:45 AM  Between 7am to 6pm - Pager - [phone]  After 6pm go to [email or web address] - password [email or web address] [facility]  [facility] [place] [facility]  Office  [phone]  CC: Primary care physician; No PCP Per Patient  Note: This dictation was prepared with Dragon dictation along with smaller phrase technology. Any transcriptional errors that  result from this process are  unintentional.

## 2015-09-17 NOTE — Progress Notes (Signed)
ANTICOAGULATION CONSULT NOTE - Initial Consult  Pharmacy Consult for Heparin Indication: stroke  No Known Allergies  Patient Measurements: Height: 5\' 2"  (157.5 cm) Weight: 201 lb 12.8 oz (91.536 kg) IBW/kg (Calculated) : 50.1 Heparin Dosing Weight: 71 kg  Vital Signs: Temp: 98.2 F (36.8 C) (03/05 0503) Temp Source: Oral (03/05 0503) BP: 104/63 mmHg (03/05 0503) Pulse Rate: 88 (03/05 0503)  Labs:  Recent Labs  09/15/15 0539 09/16/15 0506 09/17/15 0547  HGB 13.6 14.8 14.6  HCT 40.5 44.3 44.2  PLT 167 167 175  LABPROT 18.4* 20.0* 20.3*  INR 1.52 1.70 1.74  HEPARINUNFRC 0.35 0.41 0.54  CREATININE  --  0.75  --     Estimated Creatinine Clearance: 114.2 mL/min (by C-G formula based on Cr of 0.75).   Medical History: History reviewed. No pertinent past medical history.  Medications:  Scheduled:  . calcium carbonate  1 tablet Oral BID WC  . docusate sodium  100 mg Oral BID  . sodium chloride flush  3 mL Intravenous Q12H  . warfarin  5 mg Oral q1800  . Warfarin - Pharmacist Dosing Inpatient   Does not apply q1800   Infusions:  . 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 Currently on Heparin drip at 1250 units/hr.   2/27 0630 Heparin level resulted at 0.5 2/27 1311 Heparin level resulted at 0.37  Goal of Therapy:  Heparin level 0.3- 0.5 units/ml Monitor platelets by anticoagulation protocol: Yes   Plan:  Heparin level 0.54. Reduce rate to 1200 units/hr. Will recheck heparin level in 6 hours.   Valen Gillison A. [place]son, Vermont[place]arm.D., BCPS Clinical Pharmacist  09/17/2015 6:45 AM

## 2015-09-17 NOTE — Progress Notes (Signed)
ANTICOAGULATION CONSULT NOTE - FOLLOW UP   Pharmacy Consult for Heparin Indication: stroke  No Known Allergies  Patient Measurements: Height: 5\' 2"  (157.5 cm) Weight: 201 lb 12.8 oz (91.536 kg) IBW/kg (Calculated) : 50.1 Heparin Dosing Weight: 71 kg  Vital Signs: Temp: 99 F (37.2 C) (03/05 2103) Temp Source: Oral (03/05 2103) BP: 102/72 mmHg (03/05 2103) Pulse Rate: 101 (03/05 2103)  Labs:  Recent Labs  09/15/15 0539 09/16/15 0506 09/17/15 0547 09/17/15 1259 09/17/15 1947  HGB 13.6 14.8 14.6  --   --   HCT 40.5 44.3 44.2  --   --   PLT 167 167 175  --   --   LABPROT 18.4* 20.0* 20.3*  --   --   INR 1.52 1.70 1.74  --   --   HEPARINUNFRC 0.35 0.41 0.54 0.52 0.41  CREATININE  --  0.75  --   --   --     Estimated Creatinine Clearance: 114.2 mL/min (by C-G formula based on Cr of 0.75).   Medical History: History reviewed. No pertinent past medical history.  Medications:  Scheduled:  . calcium carbonate  1 tablet Oral BID WC  . docusate sodium  100 mg Oral BID  . sodium chloride flush  3 mL Intravenous Q12H  . warfarin  7.5 mg Oral q1800  . Warfarin - Pharmacist Dosing Inpatient   Does not apply q1800   Infusions:  . 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 Currently on Heparin drip at 1150 units/hr.    Goal of Therapy:  Heparin level 0.3- 0.5 units/ml Monitor platelets by anticoagulation protocol: Yes   Plan:  Will continue heparin drip at 1150units/hr. Will obtain Anti-Xa with am labs.   Pharmacy will continue to monitor and adjust per consult.    Charlies SilversMichael Simpson  09/17/2015 9:17 PM

## 2015-09-17 NOTE — Progress Notes (Signed)
ANTICOAGULATION CONSULT NOTE -FOLLOW UP  Pharmacy Consult for warfarin Indication: Dural Venous Thrombis  No Known Allergies  Patient Measurements: Height: 5\' 2"  (157.5 cm) Weight: 201 lb 12.8 oz (91.536 kg) IBW/kg (Calculated) : 50.1 Heparin Dosing Weight: na  Vital Signs: Temp: 98.2 F (36.8 C) (03/05 0503) Temp Source: Oral (03/05 0503) BP: 104/63 mmHg (03/05 0503) Pulse Rate: 88 (03/05 0503)  Labs:  Recent Labs  09/15/15 0539 09/16/15 0506 09/17/15 0547  HGB 13.6 14.8 14.6  HCT 40.5 44.3 44.2  PLT 167 167 175  LABPROT 18.4* 20.0* 20.3*  INR 1.52 1.70 1.74  HEPARINUNFRC 0.35 0.41 0.54  CREATININE  --  0.75  --     Estimated Creatinine Clearance: 114.2 mL/min (by C-G formula based on Cr of 0.75).   Medical History: History reviewed. No pertinent past medical history.  Medications:  Scheduled:  . calcium carbonate  1 tablet Oral BID WC  . docusate sodium  100 mg Oral BID  . sodium chloride flush  3 mL Intravenous Q12H  . warfarin  5 mg Oral q1800  . Warfarin - Pharmacist Dosing Inpatient   Does not apply q1800   Infusions:  . 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 Patient is currently being bridged on heparin drip and is on day 4 of warfarin.  3/1: INR: 1.02; 10 mg 3/2: INR: 1.06; 10 mg  3/3: INR: 1.52; 5 mg  3/4: INR: 1.70; 5 mg  3/5: INR: 1.74;  Goal of Therapy:  INR 2-3 Monitor platelets by anticoagulation protocol: Yes   Plan:  Will start warfarin 7.5 mg PO daily.   Patient will need to bridged for minimum of 5 days. Recommend continuing bridging for two INRs > 2.   Pharmacy will continue to monitor and adjust per consult.   Demetrius Charity.Angenette Daily D. Linsey Hirota, PharmD   09/17/2015 8:18 AM

## 2015-09-18 ENCOUNTER — Ambulatory Visit: Payer: BLUE CROSS/BLUE SHIELD

## 2015-09-18 ENCOUNTER — Ambulatory Visit: Payer: BLUE CROSS/BLUE SHIELD | Admitting: Primary Care

## 2015-09-18 LAB — CBC
HEMATOCRIT: 44.1 % (ref 35.0–47.0)
Hemoglobin: 14.5 g/dL (ref 12.0–16.0)
MCH: 29.2 pg (ref 26.0–34.0)
MCHC: 32.9 g/dL (ref 32.0–36.0)
MCV: 88.9 fL (ref 80.0–100.0)
PLATELETS: 177 10*3/uL (ref 150–440)
RBC: 4.96 MIL/uL (ref 3.80–5.20)
RDW: 14.2 % (ref 11.5–14.5)
WBC: 6.7 10*3/uL (ref 3.6–11.0)

## 2015-09-18 LAB — PROTIME-INR
INR: 2.1
Prothrombin Time: 23.4 seconds — ABNORMAL HIGH (ref 11.4–15.0)

## 2015-09-18 LAB — HEPARIN LEVEL (UNFRACTIONATED): Heparin Unfractionated: 0.43 IU/mL (ref 0.30–0.70)

## 2015-09-18 MED ORDER — WARFARIN SODIUM 4 MG PO TABS
6.0000 mg | ORAL_TABLET | Freq: Every day | ORAL | Status: DC
  Administered 2015-09-18: 6 mg via ORAL
  Filled 2015-09-18: qty 1

## 2015-09-18 NOTE — Progress Notes (Addendum)
[facility] - [place] at [facility]   PATIENT NAME: Maria Brooks    MR#:  [id number]  DATE OF BIRTH:  1991-03-16  SUBJECTIVE:  No complaint. No bleeding . On heparin drip and coumadin. REVIEW OF SYSTEMS:    Review of Systems  Constitutional: Negative for fever, chills and malaise/fatigue.  HENT: Negative for sore throat.   Eyes: Negative for blurred vision.  Respiratory: Negative for cough, hemoptysis, shortness of breath and wheezing.   Cardiovascular: Negative for chest pain, palpitations and leg swelling.  Gastrointestinal: Negative for nausea, vomiting, abdominal pain, diarrhea and blood in stool.  Genitourinary: Negative for dysuria.  Musculoskeletal: Negative for back pain.  Neurological: Negative for dizziness, tremors and headaches.  Endo/Heme/Allergies: Does not bruise/bleed easily.   Tolerating Diet: Yes  DRUG ALLERGIES:  No Known Allergies  VITALS:  Blood pressure 109/79, pulse 102, temperature 98.4 F (36.9 C), temperature source Oral, resp. rate 18, height  (1.575 m), weight 91.173 kg (201 lb), last menstrual period 08/25/2015, SpO2 100 %.  PHYSICAL EXAMINATION:   Physical Exam  Constitutional: She is oriented to person, place, and time and well-developed, well-nourished, and in no distress. No distress.  HENT:  Head: Normocephalic.  Eyes: No scleral icterus.  Neck: Normal range of motion. Neck supple. No JVD present. No tracheal deviation present.  Cardiovascular: Normal rate, regular rhythm and normal heart sounds.  Exam reveals no gallop and no friction rub.   No murmur heard. Pulmonary/Chest: Effort normal and breath sounds normal. No respiratory distress. She has no wheezes. She has no rales. She exhibits no tenderness.  Abdominal: Soft. Bowel sounds are normal. She exhibits no distension and no mass. There is no tenderness. There is no rebound and no guarding.  Musculoskeletal: Normal range of motion. She exhibits no  edema.  Neurological: She is alert and oriented to person, place, and time.  Skin: Skin is warm. No rash noted. No erythema.  Psychiatric: Affect and judgment normal.   LABORATORY PANEL:   CBC  Recent Labs Lab 09/18/15 0531  WBC 6.7  HGB 14.5  HCT 44.1  PLT 177   ------------------------------------------------------------------------------------------------------------------  Chemistries   Recent Labs Lab 09/16/15 0506  NA 141  K 3.7  CL 109  CO2 24  GLUCOSE 82  BUN 10  CREATININE 0.75  CALCIUM 8.6*   ------------------------------------------------------------------------------------------------------------------  Cardiac Enzymes No results for input(s): TROPONINI in the last 168 hours. ------------------------------------------------------------------------------------------------------------------  RADIOLOGY:  No results found.   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 1. Dural venous thrombosis Repeat MRI shows stable thrombus no evidence of extension. Continue heparin and Coumadin. INR trending up slowly. Today at 2.1. Bridging with heparin one more day with INR>2 for 2 days per pharmacist. Her coagulable workup was ordered and will need to be followed up as outpatient.  Avoid estrogen products.  2. Obesity: BMI is 34.8; encouraged healthy diet and exercise this admission   Management plans discussed with the patient and she is in agreement. Greater than 50% time was spent on coordination of care and face-to-face counseling.  CODE STATUS: FULL  TOTAL TIME TAKING CARE OF THIS PATIENT: 25 minutes.   Discharge when INR between 2 and 3.  Shaune Pollack M.D on 09/18/2015 at 3:18 PM  Between 7am to 6pm - Pager - [phone]  After 6pm go to [email or web address] - password [email or web address] [facility]  [place] [facility]  Office  [phone]  CC: Primary care physician; No PCP Per Patient  Note: This dictation was prepared  with Dragon dictation along with smaller phrase technology. Any transcriptional errors that result from this process are unintentional.

## 2015-09-18 NOTE — Progress Notes (Signed)
ANTICOAGULATION CONSULT NOTE -FOLLOW UP  Pharmacy Consult for warfarin Indication: Dural Venous Thrombis  No Known Allergies  Patient Measurements: Height: 5\' 2"  (157.5 cm) Weight: 201 lb (91.173 kg) IBW/kg (Calculated) : 50.1  Vital Signs: Temp: 98.2 F (36.8 C) (03/06 0613) Temp Source: Oral (03/06 0613) BP: 111/75 mmHg (03/06 0613) Pulse Rate: 110 (03/06 0613)  Labs:  Recent Labs  09/16/15 0506 09/17/15 0547 09/17/15 1259 09/17/15 1947 09/18/15 0531  HGB 14.8 14.6  --   --   --   HCT 44.3 44.2  --   --   --   PLT 167 175  --   --   --   LABPROT 20.0* 20.3*  --   --  23.4*  INR 1.70 1.74  --   --  2.10  HEPARINUNFRC 0.41 0.54 0.52 0.41 0.43  CREATININE 0.75  --   --   --   --     Estimated Creatinine Clearance: 113.8 mL/min (by C-G formula based on Cr of 0.75).   Assessment: Pharmacy consulted for warfarin dosing for 25 yo female being treated for dual venous thrombosis. Patient is currently being bridged on heparin drip and is on day 4 of warfarin.  3/1: INR: 1.02; 10 mg 3/2: INR: 1.06; 10 mg  3/3: INR: 1.52; 5 mg  3/4: INR: 1.70; 5 mg  3/5: INR: 1.74; 7.5 mg 3/6: INR: 2.10  Goal of Therapy:  INR 2-3 Monitor platelets by anticoagulation protocol: Yes   Plan:  INR therapeutic today (Day 6 of bridging). Will order warfarin 6 mg PO daily for today based on INR trend. Likely have not seen full effects of yesterday's dose. Follow up INR in AM.   Patient will need to bridged for minimum of 5 days and until two consecutive INRs > 2.   Pharmacy will continue to monitor and adjust per consult.     Crist FatHannah Kathye Cipriani, PharmD, BCPS Clinical Pharmacist 09/18/2015 8:32 AM

## 2015-09-18 NOTE — Progress Notes (Addendum)
ANTICOAGULATION CONSULT NOTE - FOLLOW UP   Pharmacy Consult for Heparin Indication: stroke  No Known Allergies  Patient Measurements: Height: 5\' 2"  (157.5 cm) Weight: 201 lb (91.173 kg) IBW/kg (Calculated) : 50.1 Heparin Dosing Weight: 71 kg  Vital Signs: Temp: 98.2 F (36.8 C) (03/06 0613) Temp Source: Oral (03/06 0613) BP: 111/75 mmHg (03/06 0613) Pulse Rate: 110 (03/06 0613)  Labs:  Recent Labs  09/16/15 0506 09/17/15 0547 09/17/15 1259 09/17/15 1947 09/18/15 0531  HGB 14.8 14.6  --   --   --   HCT 44.3 44.2  --   --   --   PLT 167 175  --   --   --   LABPROT 20.0* 20.3*  --   --  23.4*  INR 1.70 1.74  --   --  2.10  HEPARINUNFRC 0.41 0.54 0.52 0.41 0.43  CREATININE 0.75  --   --   --   --     Estimated Creatinine Clearance: 113.8 mL/min (by C-G formula based on Cr of 0.75).   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 Currently on Heparin drip at 1150 units/hr.   Anti-Xa level 3/6 AM = 0.43, within goal range Per RN, confirms heparin drip running at 11.5 ml/hr; no line issues and no s/sx of bleeding noted.    Goal of Therapy:  Heparin level 0.3- 0.5 units/ml Monitor platelets by anticoagulation protocol: Yes   Plan:  Will continue heparin drip at 1150units/hr. Will obtain Anti-Xa with am labs.  CBC in AM No CBC this AM, will order as add on - hgb and plt count have been stable  Pharmacy will continue to monitor and adjust per consult.      Crist FatHannah Bebe Moncure, PharmD, BCPS Clinical Pharmacist 09/18/2015 8:26 AM    Addendum Hgb14.5, Plt 177 - stable Crist FatHannah Trustin Chapa, PharmD, BCPS Clinical Pharmacist 09/18/2015 8:46 AM

## 2015-09-18 NOTE — Progress Notes (Signed)
Subjective: Patient continues to do well.  No new complaints.    Objective: Current vital signs: BP 111/75 mmHg  Pulse 110  Temp(Src) 98.2 F (36.8 C) (Oral)  Resp 18  Ht 5\' 2"  (1.575 m)  Wt 91.173 kg (201 lb)  BMI 36.75 kg/m2  SpO2 98%  LMP 08/25/2015 (Approximate) Vital signs in last 24 hours: Temp:  [97.9 F (36.6 C)-99 F (37.2 C)] 98.2 F (36.8 C) (03/06 16100613) Pulse Rate:  [101-110] 110 (03/06 0613) Resp:  [18-20] 18 (03/06 96040613) BP: (102-111)/(68-75) 111/75 mmHg (03/06 0613) SpO2:  [96 %-98 %] 98 % (03/06 0613) Weight:  [91.173 kg (201 lb)] 91.173 kg (201 lb) (03/06 0500)  Intake/Output from previous day:   Intake/Output this shift:   Nutritional status: Diet Heart Room service appropriate?: Yes; Fluid consistency:: Thin  Neurologic Exam: Mental Status: Alert, oriented, thought content appropriate. Speech fluent without evidence of aphasia. Able to follow 3 step commands without difficulty. Cranial Nerves: II: Discs flat bilaterally; Visual fields grossly normal, pupils equal, round, reactive to light and accommodation III,IV, VI: ptosis not present, extra-ocular motions intact bilaterally V,VII: smile symmetric, facial light touch sensation normal bilaterally VIII: hearing normal bilaterally IX,X: gag reflex present XI: bilateral shoulder shrug XII: left tongue deviation Motor: 5/5throughout  Lab Results: Basic Metabolic Panel:  Recent Labs Lab 09/16/15 0506  NA 141  K 3.7  CL 109  CO2 24  GLUCOSE 82  BUN 10  CREATININE 0.75  CALCIUM 8.6*    Liver Function Tests: No results for input(s): AST, ALT, ALKPHOS, BILITOT, PROT, ALBUMIN in the last 168 hours. No results for input(s): LIPASE, AMYLASE in the last 168 hours. No results for input(s): AMMONIA in the last 168 hours.  CBC:  Recent Labs Lab 09/14/15 0517 09/15/15 0539 09/16/15 0506 09/17/15 0547 09/18/15 0531  WBC 5.7 5.6 5.5 6.5 6.7  HGB 13.4 13.6 14.8 14.6 14.5  HCT 40.1 40.5  44.3 44.2 44.1  MCV 90.4 88.2 90.9 88.9 88.9  PLT 166 167 167 175 177    Cardiac Enzymes: No results for input(s): CKTOTAL, CKMB, CKMBINDEX, TROPONINI in the last 168 hours.  Lipid Panel: No results for input(s): CHOL, TRIG, HDL, CHOLHDL, VLDL, LDLCALC in the last 168 hours.  CBG: No results for input(s): GLUCAP in the last 168 hours.  Microbiology: No results found for this or any previous visit.  Coagulation Studies:  Recent Labs  09/16/15 0506 09/17/15 0547 09/18/15 0531  LABPROT 20.0* 20.3* 23.4*  INR 1.70 1.74 2.10    Imaging: No results found.  Medications:  I have reviewed the patient's current medications. Scheduled: . calcium carbonate  1 tablet Oral BID WC  . docusate sodium  100 mg Oral BID  . sodium chloride flush  3 mL Intravenous Q12H  . warfarin  6 mg Oral q1800  . Warfarin - Pharmacist Dosing Inpatient   Does not apply q1800    Assessment/Plan: Patient stable.  INR has reached target at 2.1.    Recommendations: 1.  Agree with maintenance of INR between 2 and 3. 2.  Follow up with neurology as an outpatient.   LOS: 9 days   Thana FarrLeslie Mildreth Reek, MD Neurology [phone] 09/18/2015  9:49 AM

## 2015-09-19 LAB — CBC
HEMATOCRIT: 44.8 % (ref 35.0–47.0)
HEMOGLOBIN: 14.8 g/dL (ref 12.0–16.0)
MCH: 29.9 pg (ref 26.0–34.0)
MCHC: 33 g/dL (ref 32.0–36.0)
MCV: 90.5 fL (ref 80.0–100.0)
Platelets: 169 10*3/uL (ref 150–440)
RBC: 4.94 MIL/uL (ref 3.80–5.20)
RDW: 14.5 % (ref 11.5–14.5)
WBC: 6.9 10*3/uL (ref 3.6–11.0)

## 2015-09-19 LAB — PROTIME-INR
INR: 2.21
PROTHROMBIN TIME: 24.3 s — AB (ref 11.4–15.0)

## 2015-09-19 LAB — HEPARIN LEVEL (UNFRACTIONATED): HEPARIN UNFRACTIONATED: 0.35 [IU]/mL (ref 0.30–0.70)

## 2015-09-19 MED ORDER — WARFARIN SODIUM 6 MG PO TABS: 6.0000 mg | ORAL_TABLET | Freq: Every day | ORAL | Status: DC

## 2015-09-19 NOTE — Progress Notes (Signed)
Discussed discharge instructions and medications with pt and her husband.  All concerns addressed.  IV removed.  RX given and pt made aware of her follow up appointment.  Educated on warfarin.  Pt transported home via car by husband.  Orson Apeanielle Kassim Guertin, RN

## 2015-09-19 NOTE — Discharge Summary (Signed)
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 No pertinent past medical history.  HOSPITAL COURSE:   1. Dural venous thrombosis She has been treated with heparin and Coumadin. Repeat MRI shows stable thrombus no evidence of extension. INR trending up slowly. INR>2 for the past 2 days. Discontinue heparin drip, continue coumadin, f/u INR as outpatient. Her coagulable workup was ordered and will need to be followed up as outpatient.  Avoid estrogen products.  2. Obesity: BMI is 34.8; encouraged healthy diet and exercise this admission  DISCHARGE CONDITIONS:   Stable, discharge to home today.  CONSULTS OBTAINED:  Treatment Team:  Pauletta Browns, MD  DRUG ALLERGIES:  No Known Allergies  DISCHARGE MEDICATIONS:   Discharge Medication List as of 09/19/2015 10:03 AM    CONTINUE these medications which have CHANGED   Details  warfarin (COUMADIN) 6 MG tablet Take 1 tablet (6 mg total) by mouth daily at 6 PM., Starting 09/19/2015, Until Discontinued, Print      STOP taking these medications     desogestrel-ethinyl estradiol (KARIVA,AZURETTE,MIRCETTE) 0.15-0.02/0.01 MG (21/5) tablet          DISCHARGE INSTRUCTIONS:    If you experience worsening of your admission symptoms, develop shortness of breath, life threatening emergency, suicidal or homicidal thoughts you must seek medical attention immediately by calling 911 or calling your MD immediately  if symptoms less severe.  You Must read complete instructions/literature along with all the possible adverse  reactions/side effects for all the Medicines you take and that have been prescribed to you. Take any new Medicines after you have completely understood and accept all the possible adverse reactions/side effects.   Please note  You were cared for by a hospitalist during your hospital stay. If you have any questions about your discharge medications or the care you received while you were in the hospital after you are discharged, you can call the unit and asked to speak with the hospitalist on call if the hospitalist that took care of you is not available. Once you are discharged, your primary care physician will handle any further medical issues. Please note that NO REFILLS for any discharge medications will be authorized once you are discharged, as it is imperative that you return to your primary care physician (or establish a relationship with a primary care physician if you do not have one) for your aftercare needs so that they can reassess your need for medications and monitor your lab values.    Today   SUBJECTIVE    No complaint.  VITAL SIGNS:  Blood pressure 103/66, pulse 96, temperature 97.6 F (36.4 C), temperature source Oral, resp. rate 20, height  (1.575 m), weight 89.404 kg (197 lb 1.6 oz), last menstrual period 08/25/2015, SpO2 98 %.  I/O:   Intake/Output Summary (Last 24 hours) at 09/19/15 1624 Last data filed at 09/18/15 1800  Gross per 24 hour  Intake      0 ml  Output      0 ml  Net      0 ml    PHYSICAL EXAMINATION:  GENERAL:  25 y.o.-year-old patient lying in the bed with no acute distress. Obese. EYES: Pupils equal, round, reactive to light and accommodation. No scleral icterus. Extraocular muscles intact.  HEENT: Head atraumatic, normocephalic. Oropharynx and nasopharynx clear.  NECK:  Supple, no jugular venous distention. No thyroid enlargement, no tenderness.  LUNGS: Normal breath sounds bilaterally, no wheezing, rales,rhonchi or crepitation. No use of  accessory muscles of respiration.  CARDIOVASCULAR: S1, S2 normal. No murmurs, rubs, or gallops.  ABDOMEN: Soft, non-tender, non-distended. Bowel sounds present. No organomegaly or mass.  EXTREMITIES: No pedal edema, cyanosis, or clubbing.  NEUROLOGIC: Cranial nerves II through XII are intact. Muscle strength 5/5 in all extremities. Sensation intact. Gait not checked.  PSYCHIATRIC: The patient is alert and oriented x 3.  SKIN: No obvious rash, lesion, or ulcer.   DATA REVIEW:   CBC  Recent Labs Lab 09/19/15 0503  WBC 6.9  HGB 14.8  HCT 44.8  PLT 169    Chemistries   Recent Labs Lab 09/16/15 0506  NA 141  K 3.7  CL 109  CO2 24  GLUCOSE 82  BUN 10  CREATININE 0.75  CALCIUM 8.6*    Cardiac Enzymes No results for input(s): TROPONINI in the last 168 hours.  Microbiology Results  No results found for this or any previous visit.  RADIOLOGY:  No results found.      Management plans discussed with the patient, family and they are in agreement.  CODE STATUS:  Code Status History    Date Active Date Inactive Code Status Order ID Comments User Context   09/09/2015  6:10 AM 09/19/2015  3:59 PM Full Code [id number]  Arnaldo NatalMichael S Diamond, MD Inpatient      TOTAL TIME TAKING CARE OF THIS PATIENT: 22 minutes.    Shaune Pollackhen, Dionysios Massman M.D on 09/19/2015 at 4:24 PM  Between 7am to 6pm - Pager - [phone]  After 6pm go to [email or web address] - password [email or web address] [facility]  [facility]  Office  [phone]  CC: Primary care physician; No PCP Per Patient

## 2015-09-19 NOTE — Discharge Instructions (Signed)
Heart healthy diet. °Activity as tolerated. °

## 2015-09-19 NOTE — Progress Notes (Signed)
ANTICOAGULATION CONSULT NOTE -FOLLOW UP  Pharmacy Consult for warfarin Indication: Dural Venous Thrombis  No Known Allergies  Patient Measurements: Height: 5\' 2"  (157.5 cm) Weight: 197 lb 1.6 oz (89.404 kg) IBW/kg (Calculated) : 50.1  Vital Signs: Temp: 97.6 F (36.4 C) (03/07 0500) Temp Source: Oral (03/07 0500) BP: 103/66 mmHg (03/07 0500) Pulse Rate: 96 (03/07 0500)  Labs:  Recent Labs  09/17/15 0547  09/17/15 1947 09/18/15 0531 09/19/15 0503  HGB 14.6  --   --  14.5 14.8  HCT 44.2  --   --  44.1 44.8  PLT 175  --   --  177 169  LABPROT 20.3*  --   --  23.4* 24.3*  INR 1.74  --   --  2.10 2.21  HEPARINUNFRC 0.54  < > 0.41 0.43 0.35  < > = values in this interval not displayed.  Estimated Creatinine Clearance: 112.6 mL/min (by C-G formula based on Cr of 0.75).   Assessment: Pharmacy consulted for warfarin dosing for 25 yo female being treated for dual venous thrombosis. Patient is currently being bridged on heparin drip and is on day 4 of warfarin.  3/1: INR: 1.02; 10 mg 3/2: INR: 1.06; 10 mg  3/3: INR: 1.52; 5 mg  3/4: INR: 1.70; 5 mg  3/5: INR: 1.74; 7.5 mg 3/6: INR: 2.10; 6mg  3/7: INR: 2.21  Goal of Therapy:  INR 2-3 Monitor platelets by anticoagulation protocol: Yes   Plan:  INR therapeutic today (Day 7 of bridging). Will continue warfarin 6 mg PO daily for today based on INR trend. Heparin drip has been discontinued and patient is for discharge home today.  Pharmacy will continue to monitor and adjust per consult if patient is not discharged home today.   Clovia CuffLisa Hamlin Devine, PharmD, BCPS 09/19/2015 9:43 AM

## 2015-09-21 ENCOUNTER — Ambulatory Visit: Payer: BLUE CROSS/BLUE SHIELD | Admitting: Primary Care

## 2015-09-25 ENCOUNTER — Ambulatory Visit (INDEPENDENT_AMBULATORY_CARE_PROVIDER_SITE_OTHER): Payer: BLUE CROSS/BLUE SHIELD | Admitting: Primary Care

## 2015-09-25 ENCOUNTER — Ambulatory Visit (INDEPENDENT_AMBULATORY_CARE_PROVIDER_SITE_OTHER): Payer: BLUE CROSS/BLUE SHIELD | Admitting: *Deleted

## 2015-09-25 ENCOUNTER — Encounter: Payer: Self-pay | Admitting: Primary Care

## 2015-09-25 VITALS — BP 118/78 | HR 126 | Temp 98.7°F | Ht 62.0 in | Wt 199.0 lb

## 2015-09-25 DIAGNOSIS — G08 Intracranial and intraspinal phlebitis and thrombophlebitis: Secondary | ICD-10-CM

## 2015-09-25 DIAGNOSIS — E669 Obesity, unspecified: Secondary | ICD-10-CM | POA: Diagnosis not present

## 2015-09-25 DIAGNOSIS — Z5181 Encounter for therapeutic drug level monitoring: Secondary | ICD-10-CM | POA: Insufficient documentation

## 2015-09-25 LAB — POCT INR: INR: 3.2

## 2015-09-25 NOTE — Progress Notes (Signed)
Subjective:    Patient ID: Maria Brooks, female    DOB: 16-May-1991, 25 y.o.   MRN: [id number]  HPI  Maria Brooks is a 25 year old female who presents today to establish care and discuss the problems mentioned below. Will obtain old records.  1) Hospital Follow Up: Presented to [facility] ED on 09/09/2015 with complaints of headache and neck pain. She also experienced nausea and vomiting, denied recent injury or trauma. 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 She was admitted to [facility] that day.  During her hospitalization she was treated with Lovenox initially. OCP was held. She was consulted by Neurology who changed her Lovenox to Heparin and increased fluids. She was tested for hypercoagulable and clotting disorders. She was bothered by neck pain during hospilization and was treated with Norco. She underwent repeat scanning several days later which included MRA, MRV. MRI's showed stable thrombus without extension. She was bridged to coumandin with a target INR of 2-3. Her hypercoaguability labs were negative, but was instructed to be followed in the outpatient setting. She was dischareged on 09/19/15 after INR levels reached goal.  Since her discharge she's followed up with Neurology last week who will see her back in 3 months for repeat imaging. They anticipate continuing the coumadin for at least 3 months. She has not had her INR checked since her discharge. She was provided with information regarding foods and medications that can affect her INR levels. She's feeling much better since discharge home. She's using tylenol as needed for breakthrough headaches and neck pain which is infrequent.  Denies numbness, tingling, weakness. She does get mild headaches every 2-3 days and will notice more often with lack of fluid intake.   2) Obesity: Present since high school. She endorses a poor diet and does not  currently exercise. She understands the importance of weight loss and is already making changes.  Her diet currently consists of: Breakfast: Skips, muffin Lunch: Fast Dinner: Fast food, some home cooked meals (chicken, pasta, limited vegetables) Snacks: Goldfish, chocolate, junk food. Desserts: Twice daily. Beverages: Water  Exercise: She is currently not exercise   Review of Systems  Constitutional: Negative for unexpected weight change.  Eyes: Negative for visual disturbance.  Respiratory: Negative for shortness of breath.   Cardiovascular: Negative for chest pain.  Gastrointestinal: Negative for nausea.  Genitourinary:       Not currently on birth control. Will meet with GYN to discuss options.  Musculoskeletal: Negative for neck pain.  Skin: Negative for rash.  Neurological: Negative for dizziness and headaches.  Psychiatric/Behavioral:       Feel well overall, is encouraged that she doesn't have a clotting disorder. Some tearfulness and sadness while in the hospital which has since dissipated.       Past Medical History  Diagnosis Date  . Chickenpox   . Allergy   . 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 Marital Status: Married    Spouse Name: N/A  . Number of Children: N/A  . Years of Education: N/A   Occupational History  . Not on file.   Social History Main Topics  . Smoking status: Never Smoker   . Smokeless tobacco: Not on file  . Alcohol Use: 0.0 oz/week    0 Standard drinks or equivalent per week     Comment: rarely  .  Drug Use: No  . Sexual Activity: Yes    Birth Control/ Protection: Pill     Comment: intercourse age 25, sexual partners less than 5   Other Topics Concern  . Not on file   Social History Narrative   Married.    No children.   Works as an TEFL teacherevent planner.   Enjoys resorting furniture, art.     Past Surgical History  Procedure Laterality Date  . None      Family History  Problem  Relation Age of Onset  . Lung cancer Mother     deceased  . Clotting disorder Mother     reportedly present before developing lung cancer  . Hyperlipidemia Father   . Hypertension Father   . Stroke Paternal Grandfather     No Known Allergies  Current Outpatient Prescriptions on File Prior to Visit  Medication Sig Dispense Refill  . warfarin (COUMADIN) 6 MG tablet Take 1 tablet (6 mg total) by mouth daily at 6 PM. 30 tablet 2   No current facility-administered medications on file prior to visit.    BP 118/78 mmHg  Pulse 126  Temp(Src) 98.7 F (37.1 C) (Oral)  Ht 5\' 2"  (1.575 m)  Wt 199 lb (90.266 kg)  BMI 36.39 kg/m2  SpO2 96%  LMP 09/16/2015    Objective:   Physical Exam  Constitutional: She is oriented to person, place, and time. She appears well-nourished.  Eyes: EOM are normal. Pupils are equal, round, and reactive to light.  Neck: Neck supple.  Cardiovascular: Normal rate and regular rhythm.   Pulmonary/Chest: Effort normal and breath sounds normal.  Neurological: She is alert and oriented to person, place, and time. No cranial nerve deficit.  Skin: Skin is warm and dry.  Psychiatric: She has a normal mood and affect.          Assessment & Plan:  Hospital Follow Up:  [facility] ED on [facility]15 with complaints of headache and neck pain. Dural venous sinus thrombosis noted on CT angio. Admitted and treated with fluids and Heparin. Bridged to coumadin and released on 09/19/15. OCP's discontinued. Feeling well since discharge home and has already followed with neurology. INR checked today and was 3.2. She will be monitored in our clinic and adjusted accordingly. Continue with neurology follow up. Spent a while discussing importance of weight loss, she understands and will start making changes. Will see her back in 6 months for follow up, sooner if needed.  All hospital notes, labs, and imaging reviewed.

## 2015-09-25 NOTE — Progress Notes (Signed)
Pre visit review using our clinic review tool, if applicable. No additional management support is needed unless otherwise documented below in the visit note. 

## 2015-09-25 NOTE — Patient Instructions (Signed)
It is important that you improve your diet in order for optimal weight loss. Decrease consumption of fried foods, fast foods, processed carbohydrates. Increase consumption of lean protein, vegetables, fruit.  You need to consume about 64 ounces of water daily.  Limit calorie consumption to 1200 calories daily.  Start exercising. You should be getting 1 hour of moderate intensity exercise 5 days weekly.  Follow up with GYN in regards to birth control as you cannot take certain forms.  Follow up with neurology as scheduled.  Follow up with [facility] CoasterG[facility] for coumadin clinic as scheduled.  Please notify me if you develop increased headaches or neck pain as before.  Follow up in 6 months for re-evaluation.  It was a pleasure to meet you today! Please don't hesitate to call me with any questions. Welcome to [facility]!

## 2015-09-25 NOTE — Assessment & Plan Note (Signed)
Present for 5 years. Poor diet and does not exercise. Discussed the importance of a healthy diet and regular exercise in order for weight loss and to reduce risk of other medical diseases. Start My Fitness Pal with calorie limitation of 1200 per day. Discussed healthy options for diet.

## 2015-09-25 NOTE — Assessment & Plan Note (Signed)
Found on CT scan in ED in late February. Currently following with neurology and is managed on coumadin.  INR rechecked today and will be monitored at our clinic. ROS negative. Neuro exam unremarkable. Will continue to monitor.

## 2015-09-25 NOTE — Progress Notes (Signed)
Patient is here today to establish at our Coumadin clinic.  She was admitted 2/24 to 3/7 for dural venous thrombosis.  She was started on 6 mg of Coumadin daily with an INR goal of 2.0-3.0.  INR is supra therapeutic today.  Discussed in detail appropriate diet while on Coumadin - hand out given.  Patient instructed to notify Coumadin clinic of any medication changes.  She verbalizes understanding.  Will decrease dose to 6 mg daily except 3 mg on Mondays beginning today.  Will recheck in 1 week and make additional changes at that time if needed.

## 2015-10-05 ENCOUNTER — Ambulatory Visit (INDEPENDENT_AMBULATORY_CARE_PROVIDER_SITE_OTHER): Payer: BLUE CROSS/BLUE SHIELD | Admitting: *Deleted

## 2015-10-05 DIAGNOSIS — Z5181 Encounter for therapeutic drug level monitoring: Secondary | ICD-10-CM | POA: Diagnosis not present

## 2015-10-05 DIAGNOSIS — G08 Intracranial and intraspinal phlebitis and thrombophlebitis: Secondary | ICD-10-CM

## 2015-10-05 LAB — POCT INR: INR: 1.8

## 2015-10-05 NOTE — Progress Notes (Signed)
INR is sub therapeutic today. Patient denies any missed doses or diet changes.  Will have her begin taking 6 mg daily.  Encouraged her to avoid greens for the next 1-2 days.  Will recheck in 1 week and consider changing dose of Coumadin tablets if INR is not therapeutic at that time.

## 2015-10-05 NOTE — Progress Notes (Signed)
Pre visit review using our clinic review tool, if applicable. No additional management support is needed unless otherwise documented below in the visit note. 

## 2015-10-12 ENCOUNTER — Ambulatory Visit (INDEPENDENT_AMBULATORY_CARE_PROVIDER_SITE_OTHER): Payer: BLUE CROSS/BLUE SHIELD | Admitting: *Deleted

## 2015-10-12 DIAGNOSIS — G08 Intracranial and intraspinal phlebitis and thrombophlebitis: Secondary | ICD-10-CM | POA: Diagnosis not present

## 2015-10-12 DIAGNOSIS — Z5181 Encounter for therapeutic drug level monitoring: Secondary | ICD-10-CM

## 2015-10-12 LAB — POCT INR: INR: 2.1

## 2015-10-12 NOTE — Progress Notes (Signed)
Pre visit review using our clinic review tool, if applicable. No additional management support is needed unless otherwise documented below in the visit note. 

## 2015-10-13 DIAGNOSIS — G08 Intracranial and intraspinal phlebitis and thrombophlebitis: Secondary | ICD-10-CM | POA: Insufficient documentation

## 2015-10-19 ENCOUNTER — Ambulatory Visit (INDEPENDENT_AMBULATORY_CARE_PROVIDER_SITE_OTHER): Payer: BLUE CROSS/BLUE SHIELD | Admitting: *Deleted

## 2015-10-19 DIAGNOSIS — Z5181 Encounter for therapeutic drug level monitoring: Secondary | ICD-10-CM | POA: Diagnosis not present

## 2015-10-19 DIAGNOSIS — G08 Intracranial and intraspinal phlebitis and thrombophlebitis: Secondary | ICD-10-CM | POA: Diagnosis not present

## 2015-10-19 LAB — POCT INR: INR: 2

## 2015-10-19 NOTE — Progress Notes (Signed)
Pre visit review using our clinic review tool, if applicable. No additional management support is needed unless otherwise documented below in the visit note. 

## 2015-11-02 ENCOUNTER — Ambulatory Visit (INDEPENDENT_AMBULATORY_CARE_PROVIDER_SITE_OTHER): Payer: BLUE CROSS/BLUE SHIELD | Admitting: *Deleted

## 2015-11-02 DIAGNOSIS — G08 Intracranial and intraspinal phlebitis and thrombophlebitis: Secondary | ICD-10-CM

## 2015-11-02 DIAGNOSIS — Z5181 Encounter for therapeutic drug level monitoring: Secondary | ICD-10-CM

## 2015-11-02 LAB — POCT INR: INR: 1.9

## 2015-11-02 NOTE — Progress Notes (Signed)
Pre visit review using our clinic review tool, if applicable. No additional management support is needed unless otherwise documented below in the visit note. 

## 2015-11-20 ENCOUNTER — Ambulatory Visit (INDEPENDENT_AMBULATORY_CARE_PROVIDER_SITE_OTHER): Payer: BLUE CROSS/BLUE SHIELD | Admitting: *Deleted

## 2015-11-20 DIAGNOSIS — G08 Intracranial and intraspinal phlebitis and thrombophlebitis: Secondary | ICD-10-CM

## 2015-11-20 DIAGNOSIS — Z5181 Encounter for therapeutic drug level monitoring: Secondary | ICD-10-CM | POA: Diagnosis not present

## 2015-11-20 LAB — POCT INR: INR: 2.4

## 2015-11-20 NOTE — Progress Notes (Signed)
Pre visit review using our clinic review tool, if applicable. No additional management support is needed unless otherwise documented below in the visit note. 

## 2015-12-07 ENCOUNTER — Ambulatory Visit (INDEPENDENT_AMBULATORY_CARE_PROVIDER_SITE_OTHER): Payer: BLUE CROSS/BLUE SHIELD | Admitting: *Deleted

## 2015-12-07 DIAGNOSIS — Z5181 Encounter for therapeutic drug level monitoring: Secondary | ICD-10-CM

## 2015-12-07 DIAGNOSIS — G08 Intracranial and intraspinal phlebitis and thrombophlebitis: Secondary | ICD-10-CM | POA: Diagnosis not present

## 2015-12-07 LAB — POCT INR: INR: 1.8

## 2015-12-07 NOTE — Progress Notes (Addendum)
Pre visit review using our clinic review tool, if applicable. No additional management support is needed unless otherwise documented below in the visit note.  INR is sub therapeutic today.  Patient denies any missed doses, new medications, or diet changes.  Patient instructed to boost with 1.5 tablets today, then begin taking one tablet daily except 1.5 tablets every Thursday.  She was encouraged to avoid greens for the next 2-3 days.  Will plan to recheck in 3 weeks and make additional adjustments at that time if needed.  Patient is scheduled to follow up with neurology prior to that - will discontinue therapy completely if advised at that time.

## 2015-12-22 ENCOUNTER — Other Ambulatory Visit: Payer: Self-pay | Admitting: Neurology

## 2015-12-22 DIAGNOSIS — G08 Intracranial and intraspinal phlebitis and thrombophlebitis: Secondary | ICD-10-CM

## 2015-12-28 ENCOUNTER — Other Ambulatory Visit (INDEPENDENT_AMBULATORY_CARE_PROVIDER_SITE_OTHER): Payer: BLUE CROSS/BLUE SHIELD

## 2015-12-28 DIAGNOSIS — G08 Intracranial and intraspinal phlebitis and thrombophlebitis: Secondary | ICD-10-CM | POA: Diagnosis not present

## 2015-12-28 DIAGNOSIS — Z5181 Encounter for therapeutic drug level monitoring: Secondary | ICD-10-CM | POA: Diagnosis not present

## 2015-12-28 LAB — POCT INR: INR: 1.6

## 2016-01-11 ENCOUNTER — Other Ambulatory Visit (INDEPENDENT_AMBULATORY_CARE_PROVIDER_SITE_OTHER): Payer: BLUE CROSS/BLUE SHIELD

## 2016-01-11 DIAGNOSIS — Z5181 Encounter for therapeutic drug level monitoring: Secondary | ICD-10-CM | POA: Diagnosis not present

## 2016-01-11 DIAGNOSIS — G08 Intracranial and intraspinal phlebitis and thrombophlebitis: Secondary | ICD-10-CM

## 2016-01-11 LAB — POCT INR: INR: 2.6

## 2016-01-12 ENCOUNTER — Ambulatory Visit
Admission: RE | Admit: 2016-01-12 | Discharge: 2016-01-12 | Disposition: A | Discharge status: Home / Self Care | Payer: BLUE CROSS/BLUE SHIELD | Source: Ambulatory Visit | Attending: Neurology | Admitting: Neurology

## 2016-01-12 DIAGNOSIS — G08 Intracranial and intraspinal phlebitis and thrombophlebitis: Secondary | ICD-10-CM | POA: Diagnosis not present

## 2016-01-15 ENCOUNTER — Other Ambulatory Visit: Payer: Self-pay

## 2016-01-15 MED ORDER — WARFARIN SODIUM 6 MG PO TABS: 6.0000 mg | ORAL_TABLET | Freq: Every day | ORAL | Status: DC

## 2016-01-15 NOTE — Telephone Encounter (Signed)
Medication phoned to pharmacy. Left detailed message on voicemail.  

## 2016-01-15 NOTE — Telephone Encounter (Signed)
Pt left v/m requesting refill warfarin to [facility]; pt is out of med; last refilled # 30 x 2 on 09/19/15. Pt last seen 09/25/15. Pt also request results of MRI on 01/12/16 ( appears MRI order by Dr Theora MasterZachary Potter, neurologist.) Pt request cb when med refilled. Mayra ReelKate Clark NP is out of office.Please advise.

## 2016-01-15 NOTE — Telephone Encounter (Signed)
Ok to refill #30, 2 refills  She should contact her neurologist for MRI results. This a very specialized study, and I would completely defer to Dr. Malvin JohnsPotter.

## 2016-02-06 ENCOUNTER — Other Ambulatory Visit (INDEPENDENT_AMBULATORY_CARE_PROVIDER_SITE_OTHER): Payer: BLUE CROSS/BLUE SHIELD

## 2016-02-06 DIAGNOSIS — Z5181 Encounter for therapeutic drug level monitoring: Secondary | ICD-10-CM | POA: Diagnosis not present

## 2016-02-06 DIAGNOSIS — G08 Intracranial and intraspinal phlebitis and thrombophlebitis: Secondary | ICD-10-CM

## 2016-02-06 LAB — POCT INR: INR: 3

## 2016-03-21 ENCOUNTER — Other Ambulatory Visit (INDEPENDENT_AMBULATORY_CARE_PROVIDER_SITE_OTHER): Payer: BLUE CROSS/BLUE SHIELD

## 2016-03-21 DIAGNOSIS — Z5181 Encounter for therapeutic drug level monitoring: Secondary | ICD-10-CM

## 2016-03-21 DIAGNOSIS — G08 Intracranial and intraspinal phlebitis and thrombophlebitis: Secondary | ICD-10-CM

## 2016-03-21 LAB — POCT INR: INR: 2.2

## 2016-04-18 ENCOUNTER — Other Ambulatory Visit (INDEPENDENT_AMBULATORY_CARE_PROVIDER_SITE_OTHER): Payer: BLUE CROSS/BLUE SHIELD

## 2016-04-18 DIAGNOSIS — G08 Intracranial and intraspinal phlebitis and thrombophlebitis: Secondary | ICD-10-CM | POA: Diagnosis not present

## 2016-04-18 DIAGNOSIS — Z5181 Encounter for therapeutic drug level monitoring: Secondary | ICD-10-CM

## 2016-04-18 LAB — POCT INR: INR: 2.4

## 2016-05-13 ENCOUNTER — Other Ambulatory Visit: Payer: Self-pay | Admitting: Primary Care

## 2016-05-13 ENCOUNTER — Telehealth: Payer: Self-pay | Admitting: *Deleted

## 2016-05-13 DIAGNOSIS — G08 Intracranial and intraspinal phlebitis and thrombophlebitis: Secondary | ICD-10-CM

## 2016-05-13 MED ORDER — WARFARIN SODIUM 6 MG PO TABS: 6.0000 mg | ORAL_TABLET | Freq: Every day | ORAL | 0 refills | Status: DC

## 2016-05-13 NOTE — Telephone Encounter (Signed)
Patient called stating that she received notice from [facility] that the refill on her Coumadin was denied. Patient wanted to know why and wants to know when she needs a follow up appointment and any additional testing?

## 2016-05-13 NOTE — Telephone Encounter (Signed)
Patient notified as instructed by telephone and verbalized understanding.  Follow-up lab appointment scheduled. 

## 2016-05-13 NOTE — Telephone Encounter (Signed)
Please notify patient that i'm not sure who or why her coumadin was discontinued. I will send refills. She needs to continue monitoring as she's previously done through our clinic. Please also remind her to ask her neurologist for the predicted duration of treatment.

## 2016-05-20 ENCOUNTER — Other Ambulatory Visit (INDEPENDENT_AMBULATORY_CARE_PROVIDER_SITE_OTHER): Payer: BLUE CROSS/BLUE SHIELD

## 2016-05-20 DIAGNOSIS — G08 Intracranial and intraspinal phlebitis and thrombophlebitis: Secondary | ICD-10-CM

## 2016-05-20 DIAGNOSIS — Z5181 Encounter for therapeutic drug level monitoring: Secondary | ICD-10-CM

## 2016-05-20 LAB — POCT INR: INR: 2.4

## 2016-08-14 ENCOUNTER — Other Ambulatory Visit: Payer: Self-pay | Admitting: Family Medicine

## 2016-08-14 DIAGNOSIS — G08 Intracranial and intraspinal phlebitis and thrombophlebitis: Secondary | ICD-10-CM

## 2016-08-14 NOTE — Telephone Encounter (Signed)
How much longer will she be taking Coumadin? I don't see a recent coumadin visit in her chart. If she's still taking, then [facility] needs to [facility].

## 2016-08-15 NOTE — Telephone Encounter (Signed)
Message left for patient to return my call.  

## 2016-08-22 NOTE — Telephone Encounter (Signed)
Message left for patient to return my call.  

## 2016-10-11 ENCOUNTER — Other Ambulatory Visit: Payer: Self-pay | Admitting: Primary Care

## 2016-10-11 DIAGNOSIS — G08 Intracranial and intraspinal phlebitis and thrombophlebitis: Secondary | ICD-10-CM

## 2016-10-15 NOTE — Telephone Encounter (Signed)
Ok to refill? Electronically refill request for warfarin (COUMADIN) 6 MG tablet. Last prescribed on 08/14/2016.   I have called patient and patient stated that she will see neurology regarding getting the scan to determine if she needs to continue. Patient request if Maria Brooks can refill for the next couple months until she gets in with Neurology.

## 2016-10-15 NOTE — Telephone Encounter (Signed)
Noted, please get her set up with [facility] for routine coumadin checks. Looks like the last one was done in November 2017.

## 2016-10-17 NOTE — Telephone Encounter (Signed)
LM on home VM (ok per DPR)  Requesting patient call back ASAP for an appointment for tomorrow with me here in the coumadin clinic.

## 2016-10-22 NOTE — Telephone Encounter (Signed)
LM on Home VM (ok per DPR) for patient to please call back ASAP for an appointment here in the coumadin clinic.

## 2016-11-06 NOTE — Telephone Encounter (Signed)
Left detailed message at [phone] H #) ok per DPR for patient to please call ASAP to set up follow up appointment here in the coumadin clinic.

## 2016-11-06 NOTE — Telephone Encounter (Signed)
Noted, appreciate the follow-up

## 2016-11-21 NOTE — Telephone Encounter (Signed)
LM again on patient's answering machine to please call ASAP to set up follow up appointment and coumadin check.  I have made multiple attempts to reach patient over the past month and today spoke directly with patient's husband, Maria Brooks, (ok per DPR) to please have wife call me ASAP to set up appointment.  I did give him my direct number in the coumadin clinic for ease of contact and communication.  It doesn't appear that you have seen patient since March 2017.  If she returns my call, would you like for me to schedule her an office visit as well? Also, if she doesn't return my call again then we may need to consider the safety risks associated with her unmanaged coumadin regimen and whether we can continue overseeing her care for this.  I am very concerned.    Please advise.

## 2016-11-22 NOTE — Telephone Encounter (Signed)
Noted, this will be the last refill of her coumadin. Please send letter if you can't get in contact with her to schedule coumadin visit follow up.

## 2016-12-05 ENCOUNTER — Telehealth: Payer: Self-pay

## 2016-12-05 NOTE — Telephone Encounter (Signed)
Official Letter of noncompliance sent to patient edited today, 12/05/16.    Please disregard letter edited on 11/27/16 as this was entered in error and not sent to the patient.

## 2016-12-05 NOTE — Telephone Encounter (Signed)
Approve of letter and no additional refills due to lack of follow up for safe monitoring.

## 2016-12-05 NOTE — Telephone Encounter (Signed)
This Clinical research associatewriter has made several attempts to contact patient since March.  Messages have been left with her husband and at provided number VM (both ok per DPR) without a response from patient.    NP, Maria RiegerKatherine Brooks, made aware and informs me that no further refills will be given and to send her a letter.  *Note:  Letter on 11/27/16 entered in error, not sent to patient please disregard.  * Official non compliance letter edited and attached to this encounter on 12/05/16 and mailed to patient pending K. Chestine Sporelark, NP's approval.

## 2017-01-13 ENCOUNTER — Other Ambulatory Visit: Payer: Self-pay | Admitting: Primary Care

## 2017-01-13 DIAGNOSIS — G08 Intracranial and intraspinal phlebitis and thrombophlebitis: Secondary | ICD-10-CM

## 2017-04-07 IMAGING — MR MR MRA NECK WO/W CM
4 series · 37 of 48 positions shown · IV contrast (19ML MULTIHANCE)
Comparison: CTA head neck 09/09/2015.

CLINICAL DATA: Right-sided neck pain radiating to head. Posterior
orbital pain for 7 days.

EXAM:
MR VENOGRAM WITHOUT CONTRAST
MR CIRCLE OF WILLIS WITHOUT CONTRAST
MRA OF THE NECK WITHOUT AND WITH CONTRAST
TECHNIQUE: Angiographic images of the Circle of Willis were obtained using MRA
technique without intravenous contrast.; Multiplanar and multiecho
pulse sequences of the neck were obtained without and with
intravenous contrast. Venographic images of the head were obtained
without contrast.
CONTRAST:  19mL MULTIHANCE GADOBENATE DIMEGLUMINE 529 MG/ML IV SOLN

[Series 3: tof_fi3d_tra (res 256) · axial · 1.0mm · 0.45mm/px · z∈[-22,+118]mm · 13 of 150 slices shown]
[im 1/150]
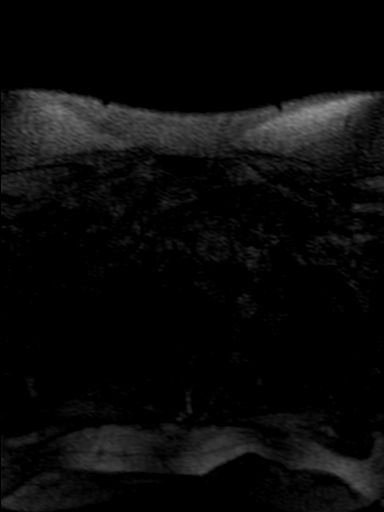
[im 9/150]
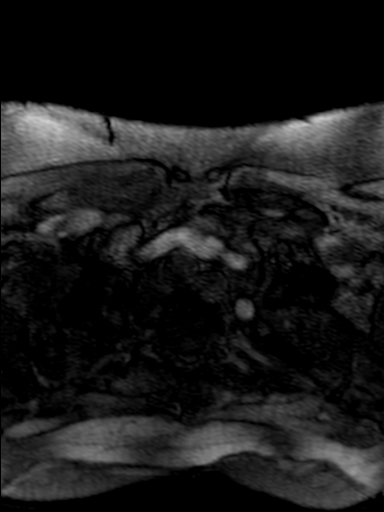
[im 18/150]
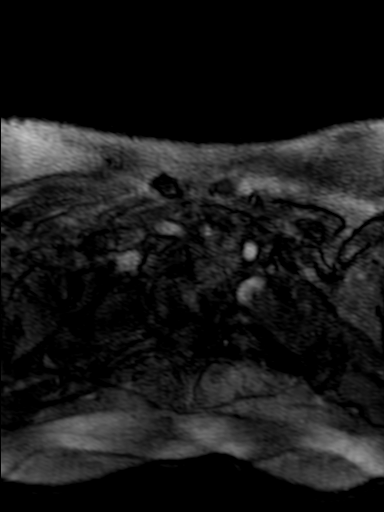
[im 27/150]
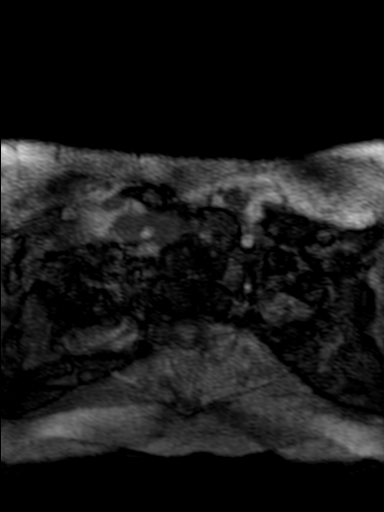
[im 36/150]
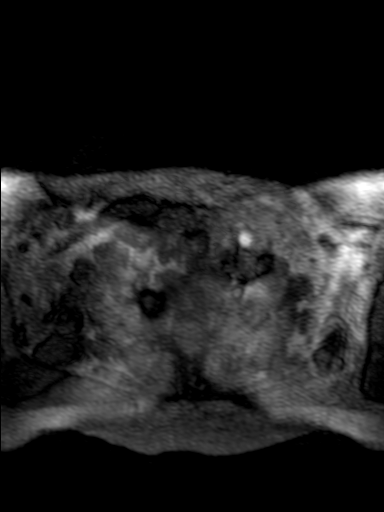
[im 44/150]
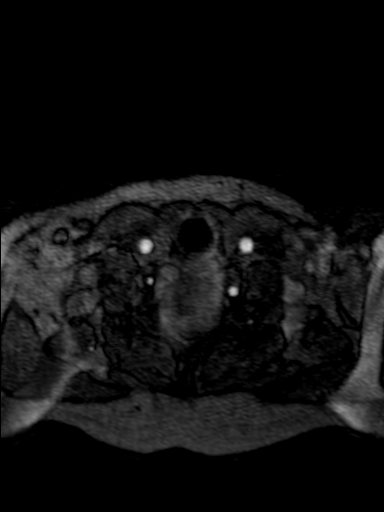
[im 62/150]
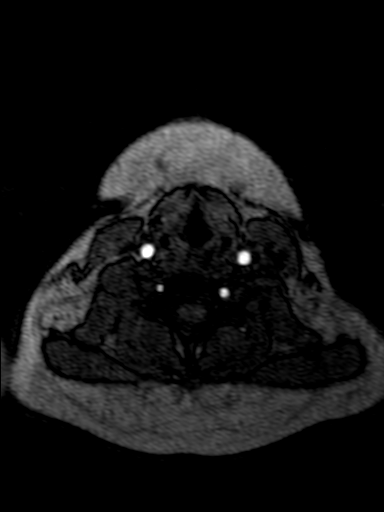
[im 79/150]
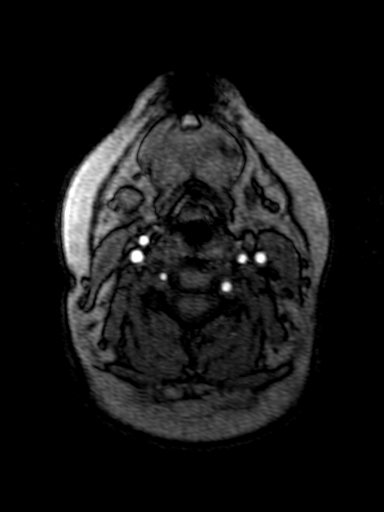
[im 88/150]
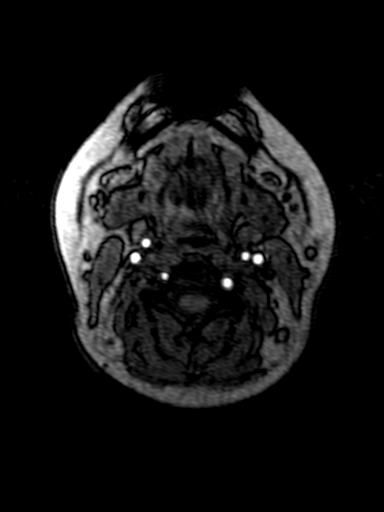
[im 106/150]
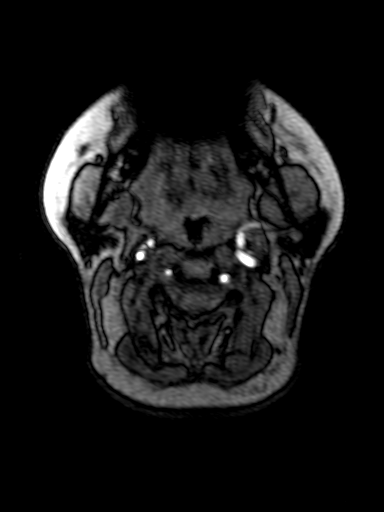
[im 123/150]
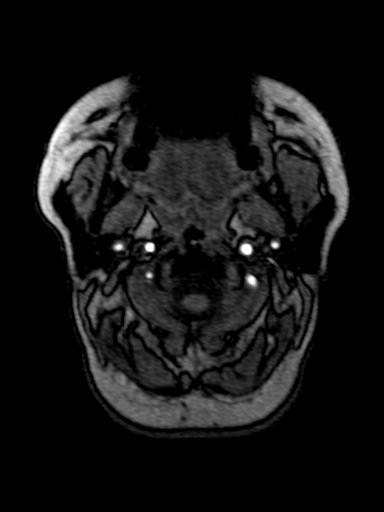
[im 132/150]
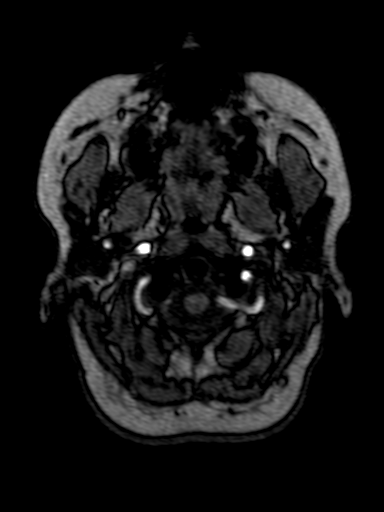
[im 141/150]
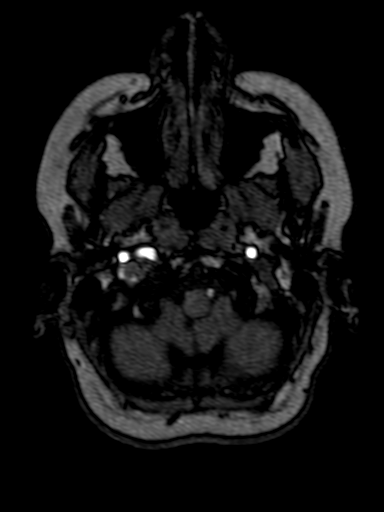

[Series 4: fl3d_ce_cor · coronal · 0.7mm · 0.78mm/px · 8 of 80 slices shown (1 of 2)]
[im 1/80]
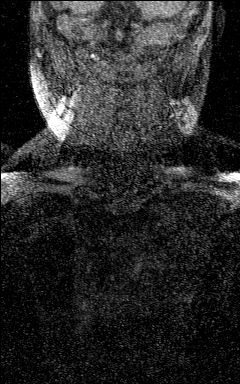
[im 9/80]
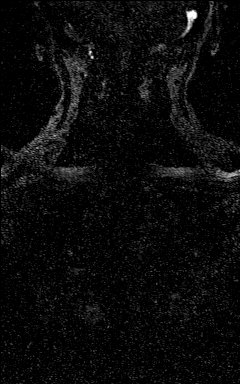
[im 27/80]
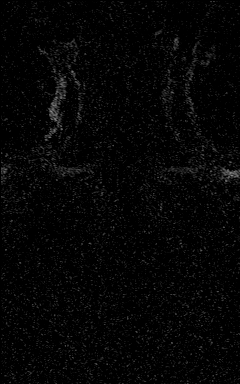
[im 36/80]
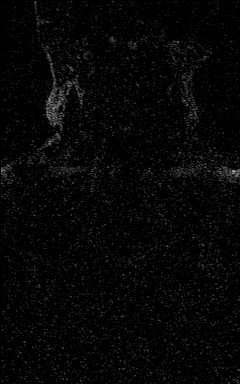
[im 44/80]
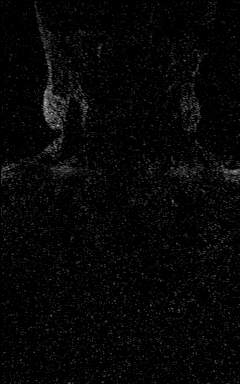
[im 53/80]
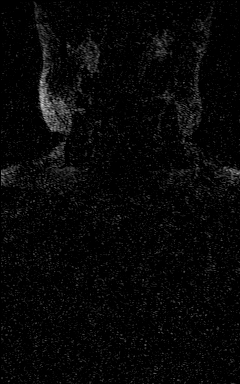
[im 71/80]
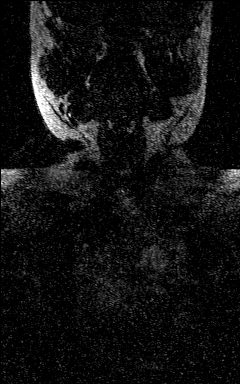
[im 80/80]
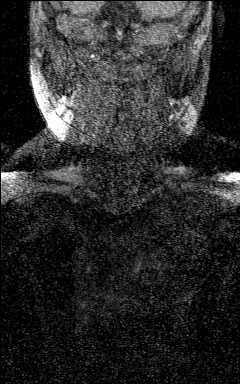

[Series 6: fl3d_ce_cor · coronal · 0.7mm · 0.78mm/px · 8 of 80 slices shown (2 of 2)]
[im 1/80]
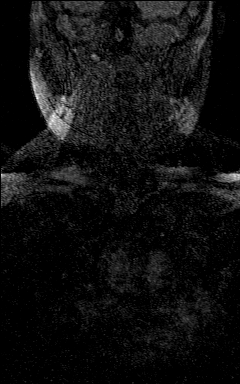
[im 9/80]
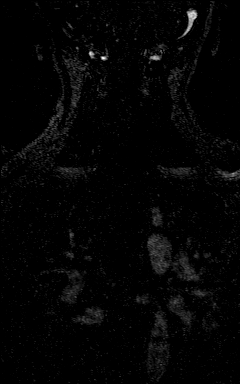
[im 27/80]
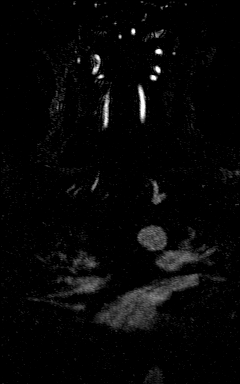
[im 36/80]
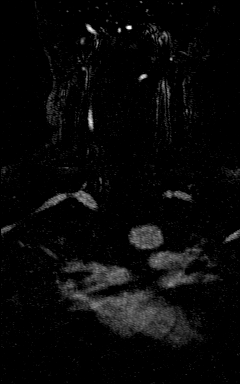
[im 44/80]
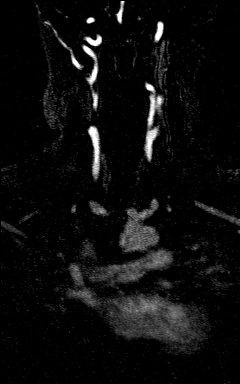
[im 53/80]
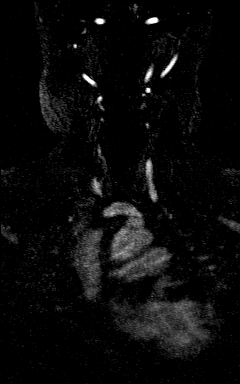
[im 71/80]
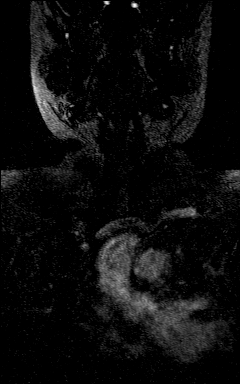
[im 80/80]
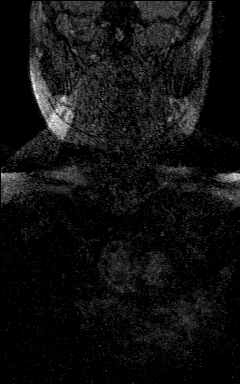

[Series 7: fl3d_ce_cor_sub · coronal · 0.7mm · 0.78mm/px · 8 of 79 slices shown]
[im 1/79]
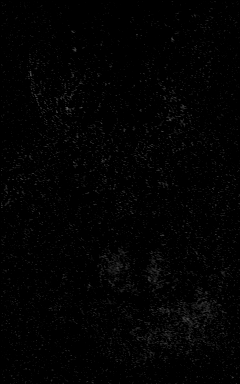
[im 9/79]
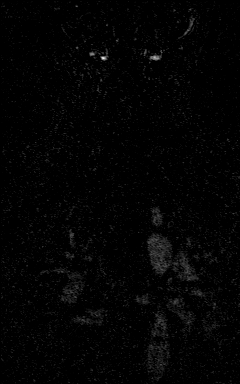
[im 27/79]
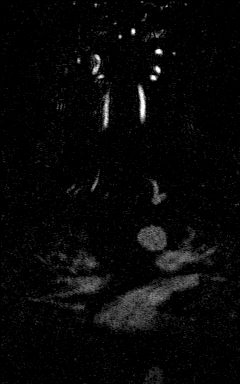
[im 35/79]
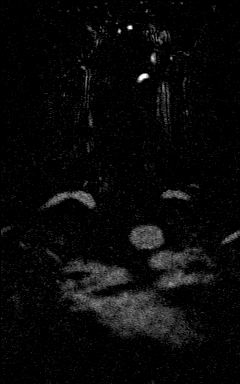
[im 44/79]
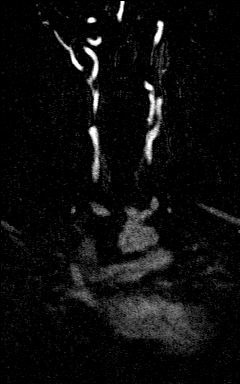
[im 53/79]
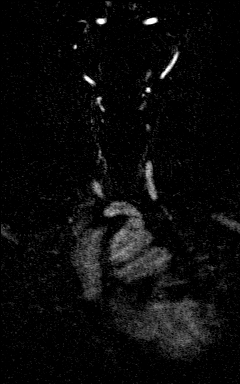
[im 70/79]
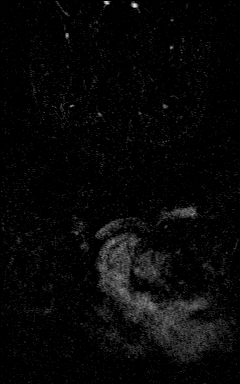
[im 79/79]
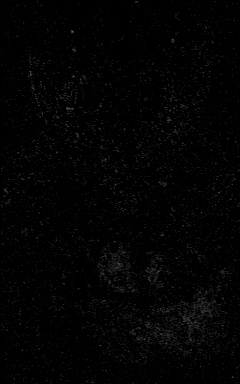

[37 of 48 positions shown; findings below may reference images not displayed]

FINDINGS: MR VENOGRAM FINDINGS

There is diminished flow related enhancement in the RIGHT transverse
sinus, sigmoid sinus, and internal jugular vein. Filling defects are
seen within the sinus, consistent with thrombus. There is no
thrombus in the superior sagittal sinus, or LEFT transverse sinus.
LEFT transverse sinus is small on a congenital basis. Findings are
consistent with acute venous sinus thrombosis, as suspected from CT.
Some residual flow can be seen within the RIGHT transverse, and
sigmoid sinus, peripherally around the clock.

MR CIRCLE OF WILLIS FINDINGS

The internal carotid arteries are widely patent. The basilar artery
is widely patent with the LEFT dominant. No proximal intracranial
stenosis or aneurysm.

MRA NECK FINDINGS

Dolichoectasia.  Bovine trunk.  No proximal stenosis.

Carotid bifurcations free of disease. No cervical ICA irregularity
or dissection.

LEFT vertebral dominant. Both contribute to formation of the
basilar. Vertebral arteries show no ostial stenosis, or significant
irregularity throughout their course through the neck.
IMPRESSION: Negative MRA of the extracranial circulation and negative MRA of the
intracranial circulation.

MR venogram demonstrating findings consistent with acute RIGHT
transverse and sigmoid sinus thrombosis, extending to the RIGHT
jugular vein, with slight residual peripheral patency. Similar
appearance to CT, when technique differences are considered.

## 2017-07-17 ENCOUNTER — Ambulatory Visit: Payer: Self-pay | Admitting: General Practice

## 2018-03-10 ENCOUNTER — Telehealth: Payer: Self-pay | Admitting: Family Medicine

## 2018-03-10 ENCOUNTER — Ambulatory Visit (INDEPENDENT_AMBULATORY_CARE_PROVIDER_SITE_OTHER): Payer: BLUE CROSS/BLUE SHIELD

## 2018-03-10 ENCOUNTER — Encounter: Payer: Self-pay | Admitting: Family Medicine

## 2018-03-10 DIAGNOSIS — Z3201 Encounter for pregnancy test, result positive: Secondary | ICD-10-CM

## 2018-03-10 LAB — POCT PREGNANCY, URINE: PREG TEST UR: POSITIVE — AB

## 2018-03-10 NOTE — Progress Notes (Signed)
Pt here today for pregnancy test.  Resulted +.  Pt reports 02/01/18  EDD 11/08/2018  5w 2d.  Medications safe to take in pregnancy given to pt.  Pt denies any concerns.  Front office to provide proof of pregnancy letter to start prenatal care.

## 2018-03-10 NOTE — Telephone Encounter (Signed)
Received fax from [facility] patient wanted to get an appointment. Called number listed , and it was her husband. I don't know what happened, but the call was dropped.

## 2018-03-23 ENCOUNTER — Encounter: Payer: BLUE CROSS/BLUE SHIELD | Admitting: Women's Health

## 2018-03-23 ENCOUNTER — Other Ambulatory Visit: Payer: Self-pay | Admitting: *Deleted

## 2018-03-23 DIAGNOSIS — O3680X0 Pregnancy with inconclusive fetal viability, not applicable or unspecified: Secondary | ICD-10-CM

## 2018-03-23 NOTE — Progress Notes (Signed)
Patient scheduled for U/S 9/16 at 10:30. Pt to arrive at 10:15 with full bladder.

## 2018-03-30 ENCOUNTER — Ambulatory Visit (HOSPITAL_COMMUNITY)
Admission: RE | Admit: 2018-03-30 | Discharge: 2018-03-30 | Disposition: A | Discharge status: Home / Self Care | Payer: BLUE CROSS/BLUE SHIELD | Source: Ambulatory Visit | Attending: Adult Health | Admitting: Adult Health

## 2018-03-30 ENCOUNTER — Telehealth: Payer: Self-pay | Admitting: Adult Health

## 2018-03-30 ENCOUNTER — Telehealth: Payer: Self-pay | Admitting: Obstetrics & Gynecology

## 2018-03-30 ENCOUNTER — Other Ambulatory Visit: Payer: Self-pay | Admitting: Adult Health

## 2018-03-30 DIAGNOSIS — Z3A08 8 weeks gestation of pregnancy: Secondary | ICD-10-CM | POA: Diagnosis not present

## 2018-03-30 DIAGNOSIS — O3680X0 Pregnancy with inconclusive fetal viability, not applicable or unspecified: Secondary | ICD-10-CM | POA: Insufficient documentation

## 2018-03-30 DIAGNOSIS — O209 Hemorrhage in early pregnancy, unspecified: Secondary | ICD-10-CM | POA: Diagnosis not present

## 2018-03-30 NOTE — Telephone Encounter (Signed)
Pt aware of US, make new OB appt for 2 weeks

## 2018-04-15 ENCOUNTER — Ambulatory Visit: Payer: BLUE CROSS/BLUE SHIELD | Admitting: *Deleted

## 2018-04-15 ENCOUNTER — Encounter: Payer: Self-pay | Admitting: Women's Health

## 2018-04-15 ENCOUNTER — Ambulatory Visit (INDEPENDENT_AMBULATORY_CARE_PROVIDER_SITE_OTHER): Payer: BLUE CROSS/BLUE SHIELD | Admitting: Women's Health

## 2018-04-15 DIAGNOSIS — Z3401 Encounter for supervision of normal first pregnancy, first trimester: Secondary | ICD-10-CM

## 2018-04-15 DIAGNOSIS — Z3490 Encounter for supervision of normal pregnancy, unspecified, unspecified trimester: Secondary | ICD-10-CM | POA: Insufficient documentation

## 2018-04-15 NOTE — Progress Notes (Signed)
New OB interview completed over the phone.

## 2018-04-16 ENCOUNTER — Other Ambulatory Visit: Payer: Self-pay | Admitting: Women's Health

## 2018-04-16 DIAGNOSIS — G08 Intracranial and intraspinal phlebitis and thrombophlebitis: Secondary | ICD-10-CM

## 2018-04-16 MED ORDER — ENOXAPARIN SODIUM 80 MG/0.8ML ~~LOC~~ SOLN: 80.0000 mg | SUBCUTANEOUS | 6 refills | Status: DC

## 2018-04-17 NOTE — Progress Notes (Signed)
Pt not seen by provider, new ob intake was done via phone. Pt has upcoming appt for new ob visit w/ provider.  Cheral Marker, CNM, [facility] 04/17/2018 9:18 AM

## 2018-04-30 ENCOUNTER — Ambulatory Visit (INDEPENDENT_AMBULATORY_CARE_PROVIDER_SITE_OTHER): Payer: BLUE CROSS/BLUE SHIELD | Admitting: Women's Health

## 2018-04-30 ENCOUNTER — Encounter: Payer: Self-pay | Admitting: Women's Health

## 2018-04-30 ENCOUNTER — Other Ambulatory Visit: Payer: Self-pay

## 2018-04-30 ENCOUNTER — Other Ambulatory Visit: Payer: Self-pay | Admitting: Obstetrics & Gynecology

## 2018-04-30 ENCOUNTER — Other Ambulatory Visit (HOSPITAL_COMMUNITY)
Admission: RE | Admit: 2018-04-30 | Discharge: 2018-04-30 | Disposition: A | Discharge status: Home / Self Care | Payer: BLUE CROSS/BLUE SHIELD | Source: Ambulatory Visit | Attending: Obstetrics & Gynecology | Admitting: Obstetrics & Gynecology

## 2018-04-30 ENCOUNTER — Ambulatory Visit (INDEPENDENT_AMBULATORY_CARE_PROVIDER_SITE_OTHER): Payer: BLUE CROSS/BLUE SHIELD

## 2018-04-30 ENCOUNTER — Encounter: Payer: Self-pay | Admitting: Emergency Medicine

## 2018-04-30 VITALS — BP 126/82 | HR 98 | Wt 200.4 lb

## 2018-04-30 DIAGNOSIS — R112 Nausea with vomiting, unspecified: Secondary | ICD-10-CM | POA: Insufficient documentation

## 2018-04-30 DIAGNOSIS — Z79899 Other long term (current) drug therapy: Secondary | ICD-10-CM | POA: Diagnosis not present

## 2018-04-30 DIAGNOSIS — R1110 Vomiting, unspecified: Secondary | ICD-10-CM | POA: Diagnosis present

## 2018-04-30 DIAGNOSIS — Z124 Encounter for screening for malignant neoplasm of cervix: Secondary | ICD-10-CM

## 2018-04-30 DIAGNOSIS — Z1389 Encounter for screening for other disorder: Secondary | ICD-10-CM

## 2018-04-30 DIAGNOSIS — Z3401 Encounter for supervision of normal first pregnancy, first trimester: Secondary | ICD-10-CM

## 2018-04-30 DIAGNOSIS — Z1379 Encounter for other screening for genetic and chromosomal anomalies: Secondary | ICD-10-CM

## 2018-04-30 DIAGNOSIS — O210 Mild hyperemesis gravidarum: Secondary | ICD-10-CM | POA: Insufficient documentation

## 2018-04-30 DIAGNOSIS — Z3A12 12 weeks gestation of pregnancy: Secondary | ICD-10-CM

## 2018-04-30 DIAGNOSIS — Z23 Encounter for immunization: Secondary | ICD-10-CM | POA: Diagnosis not present

## 2018-04-30 DIAGNOSIS — Z3402 Encounter for supervision of normal first pregnancy, second trimester: Secondary | ICD-10-CM

## 2018-04-30 DIAGNOSIS — Z331 Pregnant state, incidental: Secondary | ICD-10-CM

## 2018-04-30 DIAGNOSIS — Z86718 Personal history of other venous thrombosis and embolism: Secondary | ICD-10-CM

## 2018-04-30 DIAGNOSIS — Z3682 Encounter for antenatal screening for nuchal translucency: Secondary | ICD-10-CM | POA: Diagnosis not present

## 2018-04-30 LAB — COMPREHENSIVE METABOLIC PANEL
ALT: 22 U/L (ref 0–44)
ANION GAP: 12 (ref 5–15)
AST: 19 U/L (ref 15–41)
Albumin: 4.4 g/dL (ref 3.5–5.0)
Alkaline Phosphatase: 51 U/L (ref 38–126)
BUN: 6 mg/dL (ref 6–20)
CO2: 19 mmol/L — AB (ref 22–32)
Calcium: 9.5 mg/dL (ref 8.9–10.3)
Chloride: 106 mmol/L (ref 98–111)
Creatinine, Ser: 0.6 mg/dL (ref 0.44–1.00)
GFR calc Af Amer: >60 mL/min (ref >60)
GFR calc non Af Amer: >60 mL/min (ref >60)
GLUCOSE: 91 mg/dL (ref 70–99)
POTASSIUM: 4.1 mmol/L (ref 3.5–5.1)
SODIUM: 137 mmol/L (ref 135–145)
Total Bilirubin: 1.1 mg/dL (ref 0.3–1.2)
Total Protein: 8 g/dL (ref 6.5–8.1)

## 2018-04-30 LAB — URINALYSIS, COMPLETE (UACMP) WITH MICROSCOPIC
BILIRUBIN URINE: NEGATIVE
Glucose, UA: NEGATIVE mg/dL
KETONES UR: 80 mg/dL — AB
LEUKOCYTES UA: NEGATIVE
Nitrite: NEGATIVE
PH: 5 (ref 5.0–8.0)
Protein, ur: 100 mg/dL — AB
Specific Gravity, Urine: 1.027 (ref 1.005–1.030)

## 2018-04-30 LAB — POCT URINALYSIS DIPSTICK OB
Blood, UA: NEGATIVE
GLUCOSE, UA: NEGATIVE
LEUKOCYTES UA: NEGATIVE
NITRITE UA: NEGATIVE

## 2018-04-30 LAB — CBC
HCT: 44.9 % (ref 36.0–46.0)
HEMOGLOBIN: 14.5 g/dL (ref 12.0–15.0)
MCH: 29.1 pg (ref 26.0–34.0)
MCHC: 32.3 g/dL (ref 30.0–36.0)
MCV: 90 fL (ref 80.0–100.0)
NRBC: 0 % (ref 0.0–0.2)
Platelets: 222 10*3/uL (ref 150–400)
RBC: 4.99 MIL/uL (ref 3.87–5.11)
RDW: 14.9 % (ref 11.5–15.5)
WBC: 13 10*3/uL — ABNORMAL HIGH (ref 4.0–10.5)

## 2018-04-30 LAB — POCT PREGNANCY, URINE: PREG TEST UR: POSITIVE — AB

## 2018-04-30 LAB — HCG, QUANTITATIVE, PREGNANCY: hCG, Beta Chain, Quant, S: 153888 m[IU]/mL — ABNORMAL HIGH (ref <5)

## 2018-04-30 LAB — LIPASE, BLOOD: LIPASE: 33 U/L (ref 11–51)

## 2018-04-30 MED ORDER — PROMETHAZINE HCL 25 MG PO TABS: 12.5000 mg | ORAL_TABLET | Freq: Four times a day (QID) | ORAL | 0 refills | Status: DC | PRN

## 2018-04-30 NOTE — Patient Instructions (Signed)
Maria Brooks, I greatly value your feedback.  If you receive a survey following your visit with [place] today, we appreciate you taking the time to fill it out.  Thanks, [name], CNM, WHNP-BC   Nausea & Vomiting  Have saltine crackers or pretzels by your bed and eat a few bites before you raise your head out of bed in the morning  Eat small frequent meals throughout the day instead of large meals  Drink plenty of fluids throughout the day to stay hydrated, just don't drink a lot of fluids with your meals.  This can make your stomach fill up faster making you feel sick  Do not brush your teeth right after you eat  Products with real ginger are good for nausea, like ginger ale and ginger hard candy Make sure it says made with real ginger!  Sucking on sour candy like lemon heads is also good for nausea  If your prenatal vitamins make you nauseated, take them at night so you will sleep through the nausea  Sea Bands  If you feel like you need medicine for the nausea & vomiting please let us know  If you are unable to keep any fluids or food down please let us know   Constipation  Drink plenty of fluid, preferably water, throughout the day  Eat foods high in fiber such as fruits, vegetables, and grains  Exercise, such as walking, is a good way to keep your bowels regular  Drink warm fluids, especially warm prune juice, or decaf coffee  Eat a 1/2 cup of real oatmeal (not instant), 1/2 cup applesauce, and 1/2-1 cup warm prune juice every day  If needed, you may take Colace (docusate sodium) stool softener once or twice a day to help keep the stool soft. If you are pregnant, wait until you are out of your first trimester (12-14 weeks of pregnancy)  If you still are having problems with constipation, you may take Miralax once daily as needed to help keep your bowels regular.  If you are pregnant, wait until you are out of your first trimester (12-14 weeks of pregnancy)   First  Trimester of Pregnancy The first trimester of pregnancy is from week 1 until the end of week 12 (months 1 through 3). A week after a sperm fertilizes an egg, the egg will implant on the wall of the uterus. This embryo will begin to develop into a baby. Genes from you and your partner are forming the baby. The female genes determine whether the baby is a boy or a girl. At 6-8 weeks, the eyes and face are formed, and the heartbeat can be seen on ultrasound. At the end of 12 weeks, all the baby's organs are formed.  Now that you are pregnant, you will want to do everything you can to have a healthy baby. Two of the most important things are to get good prenatal care and to follow your health care provider's instructions. Prenatal care is all the medical care you receive before the baby's birth. This care will help prevent, find, and treat any problems during the pregnancy and childbirth. BODY CHANGES Your body goes through many changes during pregnancy. The changes vary from woman to woman.   You may gain or lose a couple of pounds at first.  You may feel sick to your stomach (nauseous) and throw up (vomit). If the vomiting is uncontrollable, call your health care provider.  You may tire easily.  You may develop headaches  that can be relieved by medicines approved by your health care provider.  You may urinate more often. Painful urination may mean you have a bladder infection.  You may develop heartburn as a result of your pregnancy.  You may develop constipation because certain hormones are causing the muscles that push waste through your intestines to slow down.  You may develop hemorrhoids or swollen, bulging veins (varicose veins).  Your breasts may begin to grow larger and become tender. Your nipples may stick out more, and the tissue that surrounds them (areola) may become darker.  Your gums may bleed and may be sensitive to brushing and flossing.  Dark spots or blotches (chloasma, mask  of pregnancy) may develop on your face. This will likely fade after the baby is born.  Your menstrual periods will stop.  You may have a loss of appetite.  You may develop cravings for certain kinds of food.  You may have changes in your emotions from day to day, such as being excited to be pregnant or being concerned that something may go wrong with the pregnancy and baby.  You may have more vivid and strange dreams.  You may have changes in your hair. These can include thickening of your hair, rapid growth, and changes in texture. Some women also have hair loss during or after pregnancy, or hair that feels dry or thin. Your hair will most likely return to normal after your baby is born. WHAT TO EXPECT AT YOUR PRENATAL VISITS During a routine prenatal visit:  You will be weighed to make sure you and the baby are growing normally.  Your blood pressure will be taken.  Your abdomen will be measured to track your baby's growth.  The fetal heartbeat will be listened to starting around week 10 or 12 of your pregnancy.  Test results from any previous visits will be discussed. Your health care provider may ask you:  How you are feeling.  If you are feeling the baby move.  If you have had any abnormal symptoms, such as leaking fluid, bleeding, severe headaches, or abdominal cramping.  If you have any questions. Other tests that may be performed during your first trimester include:  Blood tests to find your blood type and to check for the presence of any previous infections. They will also be used to check for low iron levels (anemia) and Rh antibodies. Later in the pregnancy, blood tests for diabetes will be done along with other tests if problems develop.  Urine tests to check for infections, diabetes, or protein in the urine.  An ultrasound to confirm the proper growth and development of the baby.  An amniocentesis to check for possible genetic problems.  Fetal screens for spina  bifida and Down syndrome.  You may need other tests to make sure you and the baby are doing well. HOME CARE INSTRUCTIONS  Medicines  Follow your health care provider's instructions regarding medicine use. Specific medicines may be either safe or unsafe to take during pregnancy.  Take your prenatal vitamins as directed.  If you develop constipation, try taking a stool softener if your health care provider approves. Diet  Eat regular, well-balanced meals. Choose a variety of foods, such as meat or vegetable-based protein, fish, milk and low-fat dairy products, vegetables, fruits, and whole grain breads and cereals. Your health care provider will help you determine the amount of weight gain that is right for you.  Avoid raw meat and uncooked cheese. These carry germs that can  cause birth defects in the baby.  Eating four or five small meals rather than three large meals a day may help relieve nausea and vomiting. If you start to feel nauseous, eating a few soda crackers can be helpful. Drinking liquids between meals instead of during meals also seems to help nausea and vomiting.  If you develop constipation, eat more high-fiber foods, such as fresh vegetables or fruit and whole grains. Drink enough fluids to keep your urine clear or pale yellow. Activity and Exercise  Exercise only as directed by your health care provider. Exercising will help you:  Control your weight.  Stay in shape.  Be prepared for labor and delivery.  Experiencing pain or cramping in the lower abdomen or low back is a good sign that you should stop exercising. Check with your health care provider before continuing normal exercises.  Try to avoid standing for long periods of time. Move your legs often if you must stand in one place for a long time.  Avoid heavy lifting.  Wear low-heeled shoes, and practice good posture.  You may continue to have sex unless your health care provider directs you  otherwise. Relief of Pain or Discomfort  Wear a good support bra for breast tenderness.   Take warm sitz baths to soothe any pain or discomfort caused by hemorrhoids. Use hemorrhoid cream if your health care provider approves.   Rest with your legs elevated if you have leg cramps or low back pain.  If you develop varicose veins in your legs, wear support hose. Elevate your feet for 15 minutes, 3-4 times a day. Limit salt in your diet. Prenatal Care  Schedule your prenatal visits by the twelfth week of pregnancy. They are usually scheduled monthly at first, then more often in the last 2 months before delivery.  Write down your questions. Take them to your prenatal visits.  Keep all your prenatal visits as directed by your health care provider. Safety  Wear your seat belt at all times when driving.  Make a list of emergency phone numbers, including numbers for family, friends, the hospital, and police and fire departments. General Tips  Ask your health care provider for a referral to a local prenatal education class. Begin classes no later than at the beginning of month 6 of your pregnancy.  Ask for help if you have counseling or nutritional needs during pregnancy. Your health care provider can offer advice or refer you to specialists for help with various needs.  Do not use hot tubs, steam rooms, or saunas.  Do not douche or use tampons or scented sanitary pads.  Do not cross your legs for long periods of time.  Avoid cat litter boxes and soil used by cats. These carry germs that can cause birth defects in the baby and possibly loss of the fetus by miscarriage or stillbirth.  Avoid all smoking, herbs, alcohol, and medicines not prescribed by your health care provider. Chemicals in these affect the formation and growth of the baby.  Schedule a dentist appointment. At home, brush your teeth with a soft toothbrush and be gentle when you floss. SEEK MEDICAL CARE IF:   You have  dizziness.  You have mild pelvic cramps, pelvic pressure, or nagging pain in the abdominal area.  You have persistent nausea, vomiting, or diarrhea.  You have a bad smelling vaginal discharge.  You have pain with urination.  You notice increased swelling in your face, hands, legs, or ankles. SEEK IMMEDIATE MEDICAL CARE IF:  You have a fever.  You are leaking fluid from your vagina.  You have spotting or bleeding from your vagina.  You have severe abdominal cramping or pain.  You have rapid weight gain or loss.  You vomit blood or material that looks like coffee grounds.  You are exposed to [place] measles and have never had them.  You are exposed to fifth disease or chickenpox.  You develop a severe headache.  You have shortness of breath.  You have any kind of trauma, such as from a fall or a car accident. Document Released: 06/25/2001 Document Revised: 11/15/2013 Document Reviewed: 05/11/2013 [facility] Patient Information 2015 [place], [place]. This information is not intended to replace advice given to you by your health care provider. Make sure you discuss any questions you have with your health care provider.

## 2018-04-30 NOTE — Progress Notes (Signed)
[place] 12+4 wks,measurements c/w dates,crl 61.31 mm,fhr 166 bpm,anterior placenta,normal ovaries bilat,NB present,NT 1.1 mm

## 2018-04-30 NOTE — Progress Notes (Signed)
INITIAL OBSTETRICAL VISIT Patient name: Maria Brooks MRN [id number]  Date of birth: 04-26-1991 Chief Complaint:   Initial Prenatal Visit ( NT/ IT/ pap. vomiting off and on since yesterday)  History of Present Illness:   Maria Brooks is a 27 y.o. G60P0000 Caucasian female at [redacted]w[redacted]d by LMP c/w 8wk u/s, with an Estimated Date of Delivery: 11/08/18 being seen today for her initial obstetrical visit.   Her obstetrical history is significant for primigravida.   Today she reports n/v, went to [employer] last week, vomited x 1 on Sun, then just didn't feel well, vomiting started again yesterday and has continued to today, not keeping food/fluids down right now, still making spit/urinating.  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 Last pap >66yrs ago. Results were: normal Review of Systems:   Pertinent items are noted in HPI Denies cramping/contractions, leakage of fluid, vaginal bleeding, abnormal vaginal discharge w/ itching/odor/irritation, headaches, visual changes, shortness of breath, chest pain, abdominal pain, severe nausea/vomiting, or problems with urination or bowel movements unless otherwise stated above.  Pertinent History Reviewed:  Reviewed past medical,surgical, social, obstetrical and family history.  Reviewed problem list, medications and allergies. OB History  Gravida Para Term Preterm AB Living  1 0 0 0 0 0  SAB TAB Ectopic Multiple Live Births  0 0 0 0      # Outcome Date GA Lbr Len/2nd Weight Sex Delivery Anes PTL Lv  1 Current            Physical Assessment:   Vitals:   04/30/18 1423  BP: 126/82  Pulse: 98  Weight: 200 lb 6.4 oz (90.9 kg)  Body mass index is 36.65 kg/m.       Physical Examination:  General appearance - well appearing, and in no distress  Mental status - alert, oriented to person, place, and time  Psych:  She has a normal mood and affect  Skin - warm  and dry, normal color, no suspicious lesions noted  Chest - effort normal, all lung fields clear to auscultation bilaterally  Heart - normal rate and regular rhythm  Abdomen - soft, nontender  Extremities:  No swelling or varicosities noted  Pelvic - VULVA: normal appearing vulva with no masses, tenderness or lesions  VAGINA: normal appearing vagina with normal color and discharge, no lesions  CERVIX: normal appearing cervix without discharge or lesions, no CMT  Thin prep pap is done  Today's NT u/s: [place] 12+4 wks,measurements c/w dates,crl 61.31 mm,fhr 166 bpm,anterior placenta,normal ovaries bilat,NB present,NT 1.1 mm  Results for orders placed or performed in visit on 04/30/18 (from the past 24 hour(s))  POC Urinalysis Dipstick OB   Collection Time: 04/30/18  2:30 PM  Result Value Ref Range   Color, UA     Clarity, UA     Glucose, UA Negative Negative   Bilirubin, UA     Ketones, UA large    Spec Grav, UA     Blood, UA neg    pH, UA     POC Protein UA Small (1+) (A) Negative, Trace   Urobilinogen, UA     Nitrite, UA neg    Leukocytes, UA Negative Negative   Appearance     Odor      Assessment & Plan:  1) Low-Risk Pregnancy G1P0000 at [redacted]w[redacted]d with an Estimated Date of Delivery: 11/08/18   2) Initial OB visit  3) H/o cerebral venous  sinus thrombosis> neg workup, continue Lovenox 80mg  daily as rx'd on 04/16/18  4) N/V, mild-mod dehydration- rx phenergan, if not improving, go to hospital for IVF  Meds:  Meds ordered this encounter  Medications  . promethazine (PHENERGAN) 25 MG tablet    Sig: Take 0.5-1 tablets (12.5-25 mg total) by mouth every 6 (six) hours as needed for nausea or vomiting.    Dispense:  30 tablet    Refill:  0    Order Specific Question:   Supervising Provider    Answer:   Lazaro Arms [[id number]]    Initial labs obtained including 1st IT Continue prenatal vitamins Reviewed n/v relief measures and warning s/s to report Reviewed recommended weight gain based  on pre-gravid BMI Encouraged well-balanced diet Genetic Screening discussed Integrated Screen: requested Cystic fibrosis screening discussed declined Ultrasound discussed; fetal survey: requested CCNC completed  Follow-up: Return in about 3 weeks (around 05/21/2018) for LROB, 2nd IT.   Orders Placed This Encounter  Procedures  . Urine Culture  . Flu Vaccine QUAD 36+ mos IM  . Integrated 1  . Urinalysis, Routine w reflex microscopic  . Obstetric Panel, Including HIV  . Pain Management Screening Profile (10S)  . POC Urinalysis Dipstick OB    Cheral Marker CNM, [facility] 04/30/2018 3:33 PM

## 2018-04-30 NOTE — ED Triage Notes (Addendum)
Patient ambulatory to triage with steady gait, without difficulty or distress noted; pt reports N/V since Sunday unrelieved by phenergan; denies abd pain; G1, approx [redacted]wks pregnant, pt at [place];

## 2018-05-01 ENCOUNTER — Other Ambulatory Visit: Payer: Self-pay | Admitting: Women's Health

## 2018-05-01 ENCOUNTER — Telehealth: Payer: Self-pay | Admitting: *Deleted

## 2018-05-01 ENCOUNTER — Emergency Department
Admission: EM | Admit: 2018-05-01 | Discharge: 2018-05-01 | Disposition: A | Discharge status: Home / Self Care | Payer: BLUE CROSS/BLUE SHIELD | Attending: Emergency Medicine | Admitting: Emergency Medicine

## 2018-05-01 DIAGNOSIS — O210 Mild hyperemesis gravidarum: Secondary | ICD-10-CM

## 2018-05-01 LAB — OBSTETRIC PANEL, INCLUDING HIV
Antibody Screen: NEGATIVE
BASOS: 0 %
Basophils Absolute: 0 10*3/uL (ref 0.0–0.2)
EOS (ABSOLUTE): 0 10*3/uL (ref 0.0–0.4)
EOS: 0 %
HEMATOCRIT: 44.1 % (ref 34.0–46.6)
HIV Screen 4th Generation wRfx: NONREACTIVE
Hemoglobin: 14.6 g/dL (ref 11.1–15.9)
Hepatitis B Surface Ag: NEGATIVE
IMMATURE GRANS (ABS): 0 10*3/uL (ref 0.0–0.1)
Immature Granulocytes: 0 %
LYMPHS: 11 %
Lymphocytes Absolute: 1 10*3/uL (ref 0.7–3.1)
MCH: 28.7 pg (ref 26.6–33.0)
MCHC: 33.1 g/dL (ref 31.5–35.7)
MCV: 87 fL (ref 79–97)
MONOCYTES: 5 %
MONOS ABS: 0.4 10*3/uL (ref 0.1–0.9)
Neutrophils Absolute: 7.6 10*3/uL — ABNORMAL HIGH (ref 1.4–7.0)
Neutrophils: 84 %
Platelets: 208 10*3/uL (ref 150–450)
RBC: 5.09 x10E6/uL (ref 3.77–5.28)
RDW: 14.5 % (ref 12.3–15.4)
RH TYPE: POSITIVE
RPR Ser Ql: NONREACTIVE
RUBELLA: 2.28 {index} (ref >0.99)
WBC: 9.1 10*3/uL (ref 3.4–10.8)

## 2018-05-01 LAB — MICROSCOPIC EXAMINATION: CASTS: NONE SEEN /LPF

## 2018-05-01 LAB — URINALYSIS, ROUTINE W REFLEX MICROSCOPIC
BILIRUBIN UA: NEGATIVE
Glucose, UA: NEGATIVE
LEUKOCYTES UA: NEGATIVE
Nitrite, UA: NEGATIVE
PH UA: 5 (ref 5.0–7.5)
RBC UA: NEGATIVE
Specific Gravity, UA: >=1.03 — AB (ref 1.005–1.030)
UUROB: 0.2 mg/dL (ref 0.2–1.0)

## 2018-05-01 MED ORDER — SODIUM CHLORIDE 0.9 % IV BOLUS
1000.0000 mL | Freq: Once | INTRAVENOUS | Status: AC
  Administered 2018-05-01: 1000 mL via INTRAVENOUS

## 2018-05-01 MED ORDER — METOCLOPRAMIDE HCL 5 MG/ML IJ SOLN
10.0000 mg | Freq: Once | INTRAMUSCULAR | Status: AC
  Administered 2018-05-01: 10 mg via INTRAVENOUS
  Filled 2018-05-01: qty 2

## 2018-05-01 MED ORDER — PROMETHAZINE HCL 25 MG RE SUPP: 25.0000 mg | Freq: Four times a day (QID) | RECTAL | 0 refills | Status: DC | PRN

## 2018-05-01 MED ORDER — PANTOPRAZOLE SODIUM 20 MG PO TBEC: 20.0000 mg | DELAYED_RELEASE_TABLET | Freq: Every day | ORAL | 6 refills | Status: DC

## 2018-05-01 MED ORDER — METOCLOPRAMIDE HCL 10 MG PO TABS: 10.0000 mg | ORAL_TABLET | Freq: Three times a day (TID) | ORAL | 0 refills | Status: DC

## 2018-05-01 MED ORDER — DEXTROSE-NACL 5-0.9 % IV SOLN
INTRAVENOUS | Status: DC
  Administered 2018-05-01: 500 mL via INTRAVENOUS

## 2018-05-01 NOTE — Progress Notes (Signed)
Pt's husband called regarding n/v, vomits po phenergan after taking. Went to ED last night and received IVF/Reglan, but not helping. Rx phenergan supp and protonix, if not helping, go to [facility].  Cheral Marker, CNM, [facility] 05/01/2018 1:41 PM

## 2018-05-01 NOTE — Telephone Encounter (Signed)
Husband called stating patient went to the ER last night due to continued nausea and dehydration. Received IV fluids and Reglan but is still feeling nauseated and vomiting.  Phenergan was not helping.  Wanting something different. Please advise.

## 2018-05-02 LAB — PMP SCREEN PROFILE (10S), URINE
Amphetamine Scrn, Ur: NEGATIVE ng/mL
BARBITURATE SCREEN URINE: NEGATIVE ng/mL
BENZODIAZEPINE SCREEN, URINE: NEGATIVE ng/mL
CANNABINOIDS UR QL SCN: NEGATIVE ng/mL
Cocaine (Metab) Scrn, Ur: NEGATIVE ng/mL
Creatinine(Crt), U: 151.9 mg/dL (ref 20.0–300.0)
METHADONE SCREEN, URINE: NEGATIVE ng/mL
OPIATE SCREEN URINE: NEGATIVE ng/mL
OXYCODONE+OXYMORPHONE UR QL SCN: NEGATIVE ng/mL
PROPOXYPHENE SCREEN URINE: NEGATIVE ng/mL
Ph of Urine: 6 (ref 4.5–8.9)
Phencyclidine Qn, Ur: NEGATIVE ng/mL

## 2018-05-02 LAB — URINE CULTURE

## 2018-05-02 NOTE — ED Provider Notes (Signed)
[facility] Emergency Department Provider Note    First MD Initiated Contact with Patient 05/01/18 0214     (approximate)  I have reviewed the triage vital signs and the nursing notes.   HISTORY  Chief Complaint Emesis    HPI Maria Brooks is a 27 y.o. female G1, P0 approximately [redacted] weeks pregnant presents to the emergency department with nausea and vomiting with inability to tolerate p.o. since Sunday.  Patient states that symptoms are unrelieved with Phenergan which was prescribed by the patient's OB/GYN.  Patient denies any abdominal pain no vaginal bleeding.  Patient denies any urinary symptoms.   Past Medical History:  Diagnosis Date  . Allergy   . Chickenpox   . Dural venous sinus thrombosis 09/09/15    Patient Active Problem List   Diagnosis Date Noted  . Supervision of normal first pregnancy 04/15/2018  . 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 Obesity (BMI 35.0-39.9 without comorbidity) 09/25/2015    Past Surgical History:  Procedure Laterality Date  . NO PAST SURGERIES    . none      Prior to Admission medications   Medication Sig Start Date End Date Taking? Authorizing Provider  enoxaparin (LOVENOX) 80 MG/0.8ML injection Inject 0.8 mLs (80 mg total) into the skin daily. 04/16/18   Cheral Marker, CNM  metoCLOPramide (REGLAN) 10 MG tablet Take 1 tablet (10 mg total) by mouth 3 (three) times daily with meals. 05/01/18 05/31/18  Darci Current, MD  pantoprazole (PROTONIX) 20 MG tablet Take 1 tablet (20 mg total) by mouth daily. 05/01/18   Cheral Marker, CNM  prenatal vitamin w/FE, FA (PRENATAL 1 + 1) 27-1 MG TABS tablet Take 1 tablet by mouth daily at 12 noon.    [provider]  promethazine (PHENERGAN) 25 MG suppository Place 1 suppository (25 mg total) rectally every 6 (six) hours as needed for nausea or vomiting. 05/01/18   Cheral Marker, CNM  promethazine (PHENERGAN) 25 MG tablet Take 0.5-1 tablets  (12.5-25 mg total) by mouth every 6 (six) hours as needed for nausea or vomiting. 04/30/18   Cheral Marker, CNM    Allergies No known drug allergies  Family History  Problem Relation Age of Onset  . Clotting disorder Mother        reportedly present before developing lung cancer  . Hyperlipidemia Father   . Hypertension Father   . Stroke Paternal Grandfather   . Heart disease Paternal Grandfather   . Cancer Maternal Grandmother        lung  . Cataracts Maternal Grandmother   . Alzheimer's disease Maternal Grandfather   . Heart failure Paternal Grandmother     Social History Social History   Tobacco Use  . Smoking status: Never Smoker  . Smokeless tobacco: Never Used  Substance Use Topics  . Alcohol use: Not Currently    Alcohol/week: 0.0 standard drinks    Comment: rarely  . Drug use: No    Review of Systems Constitutional: No fever/chills Eyes: No visual changes. ENT: No sore throat. Cardiovascular: Denies chest pain. Respiratory: Denies shortness of breath. Gastrointestinal: No abdominal pain.  positive for nausea and vomiting no diarrhea.  No constipation. Genitourinary: Negative for dysuria. Musculoskeletal: Negative for neck pain.  Negative for back pain. Integumentary: Negative for rash. Neurological: Negative for headaches, focal weakness or numbness.   ____________________________________________   PHYSICAL EXAM:  VITAL SIGNS: ED Triage Vitals [04/30/18 2146]  Enc Vitals Group  BP (!) 143/91     Pulse Rate 100     Resp 18     Temp 99.2 F (37.3 C)     Temp Source Oral     SpO2 97 %     Weight 90.7 kg (200 lb)     Height 1.575 m (5\' 2" )     Head Circumference      Peak Flow      Pain Score 0     Pain Loc      Pain Edu?      Excl. in GC?     Constitutional: Alert and oriented. Well appearing and in no acute distress. Eyes: Conjunctivae are normal.  Mouth/Throat: Mucous membranes are dry.   Neck: No stridor.   Cardiovascular:  Normal rate, regular rhythm. Good peripheral circulation. Grossly normal heart sounds. Respiratory: Normal respiratory effort.  No retractions. Lungs CTAB. Gastrointestinal: Soft and nontender. No distention.  Musculoskeletal: No lower extremity tenderness nor edema. No gross deformities of extremities. Neurologic:  Normal speech and language. No gross focal neurologic deficits are appreciated.  Skin:  Skin is warm, dry and intact. No rash noted. Psychiatric: Mood and affect are normal. Speech and behavior are normal.  ____________________________________________   LABS (all labs ordered are listed, but only abnormal results are displayed)  Labs Reviewed  COMPREHENSIVE METABOLIC PANEL - Abnormal; Notable for the following components:      Result Value   CO2 19 (*)    All other components within normal limits  CBC - Abnormal; Notable for the following components:   WBC 13.0 (*)    All other components within normal limits  URINALYSIS, COMPLETE (UACMP) WITH MICROSCOPIC - Abnormal; Notable for the following components:   Color, Urine YELLOW (*)    APPearance HAZY (*)    Hgb urine dipstick MODERATE (*)    Ketones, ur 80 (*)    Protein, ur 100 (*)    Bacteria, UA RARE (*)    All other components within normal limits  HCG, QUANTITATIVE, PREGNANCY - Abnormal; Notable for the following components:   hCG, Beta Chain, Quant, S 811,914 (*)    All other components within normal limits  POCT PREGNANCY, URINE - Abnormal; Notable for the following components:   Preg Test, Ur POSITIVE (*)    All other components within normal limits  LIPASE, BLOOD    Procedures   ____________________________________________   INITIAL IMPRESSION / ASSESSMENT AND PLAN / ED COURSE  As part of my medical decision making, I reviewed the following data within the electronic MEDICAL RECORD NUMBER   27 year old female presenting with above-stated history and physical exam secondary to nausea vomiting approximately  [redacted] weeks pregnant.  Patient given 10 mg IV Reglan 1 L IV normal saline and 1 L D5 normal saline with complete resolution of nausea.  Patient able to eat and drink before discharge from the emergency department. ____________________________________________  FINAL CLINICAL IMPRESSION(S) / ED DIAGNOSES  Final diagnoses:  Hyperemesis gravidarum     MEDICATIONS GIVEN DURING THIS VISIT:  Medications  sodium chloride 0.9 % bolus 1,000 mL (0 mLs Intravenous Stopped 05/01/18 0410)  metoCLOPramide (REGLAN) injection 10 mg (10 mg Intravenous Given 05/01/18 0241)     ED Discharge Orders         Ordered    metoCLOPramide (REGLAN) 10 MG tablet  3 times daily with meals     05/01/18 0406           Note:  This document was prepared  using Conservation officer, historic buildings and may include unintentional dictation errors.    Darci Current, MD 05/02/18 [phone]

## 2018-05-03 LAB — INTEGRATED 1
CROWN RUMP LENGTH MAT SCREEN: 61.3 mm
GEST. AGE ON COLLECTION DATE: 12.4 wk
MATERNAL AGE AT EDD: 27.7 a
Nuchal Translucency (NT): 1.1 mm
Number of Fetuses: 1
PAPP-A Value: 1201.4 ng/mL
Weight: 200 [lb_av]

## 2018-05-04 ENCOUNTER — Other Ambulatory Visit: Payer: Self-pay | Admitting: Women's Health

## 2018-05-04 ENCOUNTER — Telehealth: Payer: Self-pay | Admitting: *Deleted

## 2018-05-04 ENCOUNTER — Encounter (HOSPITAL_COMMUNITY): Payer: Self-pay | Admitting: *Deleted

## 2018-05-04 ENCOUNTER — Other Ambulatory Visit: Payer: BLUE CROSS/BLUE SHIELD

## 2018-05-04 ENCOUNTER — Inpatient Hospital Stay (HOSPITAL_COMMUNITY)
Admission: AD | Admit: 2018-05-04 | Discharge: 2018-05-05 | Disposition: A | Discharge status: Home / Self Care | Payer: BLUE CROSS/BLUE SHIELD | Source: Ambulatory Visit | Attending: Obstetrics and Gynecology | Admitting: Obstetrics and Gynecology

## 2018-05-04 ENCOUNTER — Encounter: Payer: BLUE CROSS/BLUE SHIELD | Admitting: Women's Health

## 2018-05-04 DIAGNOSIS — Z3A13 13 weeks gestation of pregnancy: Secondary | ICD-10-CM | POA: Diagnosis not present

## 2018-05-04 DIAGNOSIS — O211 Hyperemesis gravidarum with metabolic disturbance: Secondary | ICD-10-CM | POA: Diagnosis present

## 2018-05-04 DIAGNOSIS — Z3402 Encounter for supervision of normal first pregnancy, second trimester: Secondary | ICD-10-CM

## 2018-05-04 HISTORY — DX: Gastro-esophageal reflux disease without esophagitis: K21.9

## 2018-05-04 LAB — URINALYSIS, ROUTINE W REFLEX MICROSCOPIC
Glucose, UA: NEGATIVE mg/dL
Hgb urine dipstick: NEGATIVE
Ketones, ur: 80 mg/dL — AB
NITRITE: NEGATIVE
PH: 6 (ref 5.0–8.0)
Protein, ur: >=300 mg/dL — AB
SPECIFIC GRAVITY, URINE: 1.029 (ref 1.005–1.030)

## 2018-05-04 LAB — CYTOLOGY - PAP
Chlamydia: NEGATIVE
DIAGNOSIS: NEGATIVE
Neisseria Gonorrhea: NEGATIVE

## 2018-05-04 MED ORDER — M.V.I. ADULT IV INJ
Freq: Once | INTRAVENOUS | Status: AC
  Administered 2018-05-05: 01:00:00 via INTRAVENOUS
  Filled 2018-05-04: qty 5

## 2018-05-04 MED ORDER — FAMOTIDINE IN NACL 20-0.9 MG/50ML-% IV SOLN
20.0000 mg | Freq: Once | INTRAVENOUS | Status: AC
  Administered 2018-05-04: 20 mg via INTRAVENOUS
  Filled 2018-05-04: qty 50

## 2018-05-04 MED ORDER — ONDANSETRON 4 MG PO TBDP: 4.0000 mg | ORAL_TABLET | Freq: Three times a day (TID) | ORAL | 0 refills | Status: DC | PRN

## 2018-05-04 MED ORDER — PROCHLORPERAZINE EDISYLATE 10 MG/2ML IJ SOLN
10.0000 mg | Freq: Once | INTRAMUSCULAR | Status: AC
  Administered 2018-05-04: 10 mg via INTRAVENOUS
  Filled 2018-05-04: qty 2

## 2018-05-04 MED ORDER — LACTATED RINGERS IV BOLUS
1000.0000 mL | Freq: Once | INTRAVENOUS | Status: AC
  Administered 2018-05-04: 1000 mL via INTRAVENOUS

## 2018-05-04 NOTE — Telephone Encounter (Signed)
Husband called back to check on the status of the nausea medication. Informed Zofran was sent to pharmacy and if it did not help, to go to [facility] for fluids. Verbalized understanding.

## 2018-05-04 NOTE — MAU Note (Addendum)
SAYS HAS HAD SOME VOMITING  - BUT  WORSE LAST WED.   [facility]- WITH [facility]-   WENT  Thursday-  GAVE MEDS-  DID NOT HELP SO  WENT  TO [place] [facility]  FOR IV  FLUIDS.  MORE NAUSEA MEDS- NOT HELP.    CALLED [facility] TODAY -  ZOFRAN-   AT 330PM- NOT HELP.

## 2018-05-04 NOTE — Telephone Encounter (Signed)
Husband called stating that Maria Brooks has still been sick all weekend despite using the phenergan suppositories. She is able to keep some ice chips but urine is dark in color. Phenergan is not working and would like something else. Please advise.

## 2018-05-05 DIAGNOSIS — Z3A13 13 weeks gestation of pregnancy: Secondary | ICD-10-CM

## 2018-05-05 DIAGNOSIS — O211 Hyperemesis gravidarum with metabolic disturbance: Secondary | ICD-10-CM

## 2018-05-05 LAB — COMPREHENSIVE METABOLIC PANEL
ALT: 34 U/L (ref 0–44)
AST: 8 U/L — ABNORMAL LOW (ref 15–41)
Albumin: 3 g/dL — ABNORMAL LOW (ref 3.5–5.0)
Alkaline Phosphatase: 65 U/L (ref 38–126)
Anion gap: 15 (ref 5–15)
BUN: <5 mg/dL — ABNORMAL LOW (ref 6–20)
CO2: 14 mmol/L — ABNORMAL LOW (ref 22–32)
Calcium: 10.3 mg/dL (ref 8.9–10.3)
Chloride: 109 mmol/L (ref 98–111)
Creatinine, Ser: 0.57 mg/dL (ref 0.44–1.00)
GFR calc Af Amer: >60 mL/min (ref >60)
GFR calc non Af Amer: >60 mL/min (ref >60)
Glucose, Bld: 110 mg/dL — ABNORMAL HIGH (ref 70–99)
Potassium: 3.4 mmol/L — ABNORMAL LOW (ref 3.5–5.1)
Sodium: 138 mmol/L (ref 135–145)
Total Bilirubin: 2.2 mg/dL — ABNORMAL HIGH (ref 0.3–1.2)
Total Protein: 6.5 g/dL (ref 6.5–8.1)

## 2018-05-05 LAB — CBC
HCT: 46.7 % — ABNORMAL HIGH (ref 36.0–46.0)
Hemoglobin: 16.1 g/dL — ABNORMAL HIGH (ref 12.0–15.0)
MCH: 30.3 pg (ref 26.0–34.0)
MCHC: 34.5 g/dL (ref 30.0–36.0)
MCV: 87.8 fL (ref 80.0–100.0)
Platelets: 238 10*3/uL (ref 150–400)
RBC: 5.32 MIL/uL — ABNORMAL HIGH (ref 3.87–5.11)
RDW: 14.9 % (ref 11.5–15.5)
WBC: 15.4 10*3/uL — ABNORMAL HIGH (ref 4.0–10.5)
nRBC: 0 % (ref 0.0–0.2)

## 2018-05-05 MED ORDER — PROCHLORPERAZINE MALEATE 10 MG PO TABS: 10.0000 mg | ORAL_TABLET | Freq: Two times a day (BID) | ORAL | 0 refills | Status: DC | PRN

## 2018-05-05 MED ORDER — SCOPOLAMINE 1 MG/3DAYS TD PT72
1.0000 | MEDICATED_PATCH | TRANSDERMAL | Status: DC
  Administered 2018-05-05: 1.5 mg via TRANSDERMAL
  Filled 2018-05-05: qty 1

## 2018-05-05 MED ORDER — SCOPOLAMINE 1 MG/3DAYS TD PT72: 1.0000 | MEDICATED_PATCH | TRANSDERMAL | 0 refills | Status: DC

## 2018-05-05 NOTE — MAU Provider Note (Signed)
History     CSN: [id number]  Arrival date and time: 05/04/18 2202   First Provider Initiated Contact with Patient 05/04/18 2315      Chief Complaint  Patient presents with  . Emesis   Maria Brooks is a 27 y.o. G1P0 at [redacted]w[redacted]d who presents to MAU with complaints of N/V. She reports N/V has been occurring for the past 3 weeks, got worse this past Wednesday. Has been receiving prenatal care at [facility] and recently prescribed Zofran for continued N/V. Was taking Reglan, Phenergan and Protonix prior to being prescribed Zofran. She reports emesis of over 20 times in the past 24 hours. Last took Zofran at 1530 today with no relief. She reports being unable to keep food or liquids down. She denies abdominal pain, vaginal bleeding, vaginal discharge, urinary symptoms or HA.    OB History    Gravida  1   Para  0   Term  0   Preterm  0   AB  0   Living  0     SAB  0   TAB  0   Ectopic  0   Multiple  0   Live Births              Past Medical History:  Diagnosis Date  . Allergy   . Chickenpox   . 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 GERD (gastroesophageal reflux disease)     Past Surgical History:  Procedure Laterality Date  . NO PAST SURGERIES    . none      Family History  Problem Relation Age of Onset  . Clotting disorder Mother        reportedly present before developing lung cancer  . Hyperlipidemia Father   . Hypertension Father   . Stroke Paternal Grandfather   . Heart disease Paternal Grandfather   . Cancer Maternal Grandmother        lung  . Cataracts Maternal Grandmother   . Alzheimer's disease Maternal Grandfather   . Heart failure Paternal Grandmother     Social History   Tobacco Use  . Smoking status: Never Smoker  . Smokeless tobacco: Never Used  Substance Use Topics  . Alcohol use: Not Currently    Alcohol/week: 0.0 standard drinks    Comment: rarely  . Drug use: No    Allergies: No Known Allergies  Medications Prior  to Admission  Medication Sig Dispense Refill Last Dose  . enoxaparin (LOVENOX) 80 MG/0.8ML injection Inject 0.8 mLs (80 mg total) into the skin daily. 30 Syringe 6 05/03/2018 at Unknown time  . metoCLOPramide (REGLAN) 10 MG tablet Take 1 tablet (10 mg total) by mouth 3 (three) times daily with meals. 90 tablet 0 05/03/2018 at Unknown time  . ondansetron (ZOFRAN-ODT) 4 MG disintegrating tablet Take 1 tablet (4 mg total) by mouth every 8 (eight) hours as needed for nausea or vomiting. 20 tablet 0 05/04/2018 at Unknown time  . pantoprazole (PROTONIX) 20 MG tablet Take 1 tablet (20 mg total) by mouth daily. 30 tablet 6 Past Week at Unknown time  . prenatal vitamin w/FE, FA (PRENATAL 1 + 1) 27-1 MG TABS tablet Take 1 tablet by mouth daily at 12 noon.   Past Week at Unknown time  . promethazine (PHENERGAN) 25 MG suppository Place 1 suppository (25 mg total) rectally every 6 (six) hours as needed for nausea or vomiting. 12 each 0 Past Week at Unknown time  . promethazine (PHENERGAN) 25 MG tablet Take  0.5-1 tablets (12.5-25 mg total) by mouth every 6 (six) hours as needed for nausea or vomiting. 30 tablet 0 Past Week at Unknown time    Review of Systems  Constitutional: Negative.   Respiratory: Negative.   Cardiovascular: Negative.   Gastrointestinal: Positive for nausea and vomiting. Negative for abdominal pain, constipation and diarrhea.  Genitourinary: Negative.   Neurological: Negative.    Physical Exam   Blood pressure 131/86, pulse (!) 111, temperature 98.1 F (36.7 C), temperature source Oral, height 5\' 2"  (1.575 m), weight 86.1 kg, last menstrual period 02/01/2018.  Physical Exam  Nursing note and vitals reviewed. Constitutional: She is oriented to person, place, and time. She appears well-developed. No distress.  Cardiovascular: Normal rate, regular rhythm and normal heart sounds.  Respiratory: Effort normal and breath sounds normal. No respiratory distress. She has no wheezes.  GI:  Soft. She exhibits no distension. There is no tenderness. There is no rebound.  Neurological: She is alert and oriented to person, place, and time.  Skin: Skin is warm and dry. There is pallor.  Psychiatric: She has a normal mood and affect. Her behavior is normal. Thought content normal.   FHR 151 by doppler   MAU Course  Procedures  MDM Orders Placed This Encounter  Procedures  . Urinalysis, Routine w reflex microscopic  . CBC  . Comprehensive metabolic panel  . Insert peripheral IV  . Discharge patient Discharge disposition: 01-Home or Self Care; Discharge patient date: 05/05/2018   Labs reviewed:  Results for orders placed or performed during the hospital encounter of 05/04/18 (from the past 24 hour(s))  Urinalysis, Routine w reflex microscopic     Status: Abnormal   Collection Time: 05/04/18 10:23 PM  Result Value Ref Range   Color, Urine AMBER (A) YELLOW   APPearance CLOUDY (A) CLEAR   Specific Gravity, Urine 1.029 1.005 - 1.030   pH 6.0 5.0 - 8.0   Glucose, UA NEGATIVE NEGATIVE mg/dL   Hgb urine dipstick NEGATIVE NEGATIVE   Bilirubin Urine SMALL (A) NEGATIVE   Ketones, ur 80 (A) NEGATIVE mg/dL   Protein, ur >=161 (A) NEGATIVE mg/dL   Nitrite NEGATIVE NEGATIVE   Leukocytes, UA SMALL (A) NEGATIVE   RBC / HPF 6-10 0 - 5 RBC/hpf   WBC, UA 21-50 0 - 5 WBC/hpf   Bacteria, UA RARE (A) NONE SEEN   Squamous Epithelial / LPF 11-20 0 - 5   Mucus PRESENT    Hyaline Casts, UA PRESENT    Ca Oxalate Crys, UA PRESENT   CBC     Status: Abnormal   Collection Time: 05/04/18 11:45 PM  Result Value Ref Range   WBC 15.4 (H) 4.0 - 10.5 K/uL   RBC 5.32 (H) 3.87 - 5.11 MIL/uL   Hemoglobin 16.1 (H) 12.0 - 15.0 g/dL   HCT 09.6 (H) 04.5 - 40.9 %   MCV 87.8 80.0 - 100.0 fL   MCH 30.3 26.0 - 34.0 pg   MCHC 34.5 30.0 - 36.0 g/dL   RDW 81.1 91.4 - 78.2 %   Platelets 238 150 - 400 K/uL   nRBC 0.0 0.0 - 0.2 %  Comprehensive metabolic panel     Status: Abnormal   Collection Time: 05/04/18  11:45 PM  Result Value Ref Range   Sodium 138 135 - 145 mmol/L   Potassium 3.4 (L) 3.5 - 5.1 mmol/L   Chloride 109 98 - 111 mmol/L   CO2 14 (L) 22 - 32 mmol/L   Glucose, Bld 110 (  H) 70 - 99 mg/dL   BUN <5 (L) 6 - 20 mg/dL   Creatinine, Ser 1.61 0.44 - 1.00 mg/dL   Calcium 09.6 8.9 - 04.5 mg/dL   Total Protein 6.5 6.5 - 8.1 g/dL   Albumin 3.0 (L) 3.5 - 5.0 g/dL   AST 8 (L) 15 - 41 U/L   ALT 34 0 - 44 U/L   Alkaline Phosphatase 65 38 - 126 U/L   Total Bilirubin 2.2 (H) 0.3 - 1.2 mg/dL   GFR calc non Af Amer >60 >60 mL/min   GFR calc Af Amer >60 >60 mL/min   Anion gap 15 5 - 15   CBC and CMP: WNL  UA: noted 80 ketones, urine culture pending   Meds ordered this encounter  Medications  . lactated ringers bolus 1,000 mL  . multivitamins adult (MVI -12) 10 mL in dextrose 5% lactated ringers 1,000 mL infusion  . famotidine (PEPCID) IVPB 20 mg premix  . prochlorperazine (COMPAZINE) injection 10 mg  . prochlorperazine (COMPAZINE) 10 MG tablet    Sig: Take 1 tablet (10 mg total) by mouth 2 (two) times daily as needed for nausea or vomiting.    Dispense:  30 tablet    Refill:  0    Order Specific Question:   Supervising Provider    Answer:   Conan Bowens [[id number]]  . scopolamine (TRANSDERM-SCOP) 1 MG/3DAYS 1.5 mg  . scopolamine (TRANSDERM-SCOP) 1 MG/3DAYS    Sig: Place 1 patch (1.5 mg total) onto the skin every 3 (three) days.    Dispense:  10 patch    Refill:  0    Order Specific Question:   Supervising Provider    Answer:   Conan Bowens [[id number]]   Treatments in MAU included LR IV bolus, banana bag, Compazine 10mg  IV, pepcid 20mg  IV and scope patch. Patient able to tolerate crackers and juice prior to discharge home. Discussed change in plan of care with medications and to take Compazine scheduled with Zofran if additional medication as needed. Patient verbalizes understanding. Rx for Compazine and scopolamine patch sent to pharmacy of choice. Pt stable at time of discharge.    Assessment and Plan   1. Hyperemesis gravidarum with dehydration   2. Encounter for supervision of normal first pregnancy in second trimester   3. [redacted] weeks gestation of pregnancy    Discharge home  Follow up as scheduled for prenatal appointment  Return to MAU as needed  Eating plan for Hyperemesis discussed  Hydration  Rx for Compazine and scope patch  Urine culture pending  Sharyon Cable CNM 05/05/2018, 3:34 AM

## 2018-05-06 LAB — CULTURE, OB URINE

## 2018-05-19 ENCOUNTER — Encounter: Payer: BLUE CROSS/BLUE SHIELD | Admitting: Obstetrics & Gynecology

## 2018-05-26 ENCOUNTER — Encounter: Payer: Self-pay | Admitting: Obstetrics & Gynecology

## 2018-05-26 ENCOUNTER — Other Ambulatory Visit: Payer: Self-pay

## 2018-05-26 ENCOUNTER — Ambulatory Visit (INDEPENDENT_AMBULATORY_CARE_PROVIDER_SITE_OTHER): Payer: BLUE CROSS/BLUE SHIELD | Admitting: Obstetrics & Gynecology

## 2018-05-26 VITALS — BP 135/88 | HR 111 | Wt 200.0 lb

## 2018-05-26 DIAGNOSIS — Z1389 Encounter for screening for other disorder: Secondary | ICD-10-CM

## 2018-05-26 DIAGNOSIS — Z1379 Encounter for other screening for genetic and chromosomal anomalies: Secondary | ICD-10-CM

## 2018-05-26 DIAGNOSIS — Z331 Pregnant state, incidental: Secondary | ICD-10-CM

## 2018-05-26 DIAGNOSIS — Z3402 Encounter for supervision of normal first pregnancy, second trimester: Secondary | ICD-10-CM

## 2018-05-26 DIAGNOSIS — Z3A16 16 weeks gestation of pregnancy: Secondary | ICD-10-CM

## 2018-05-26 LAB — POCT URINALYSIS DIPSTICK OB
Blood, UA: NEGATIVE
GLUCOSE, UA: NEGATIVE
Ketones, UA: NEGATIVE
Leukocytes, UA: NEGATIVE
NITRITE UA: NEGATIVE
PROTEIN: NEGATIVE

## 2018-05-26 NOTE — Progress Notes (Signed)
   LOW-RISK PREGNANCY VISIT Patient name: Maria Brooks A Snelson MRN [id number]  Date of birth: 09-22-1990 Chief Complaint:   Routine Prenatal Visit (2nd IT)  History of Present Illness:   Maria Brooks A Brazel is a 27 y.o. G1P0000 female at 7648w2d with an Estimated Date of Delivery: 11/08/18 being seen today for ongoing management of a low-risk pregnancy.  Today she reports no complaints.  . Vag. Bleeding: None.  Movement: Absent. denies leaking of fluid. Review of Systems:   Pertinent items are noted in HPI Denies abnormal vaginal discharge w/ itching/odor/irritation, headaches, visual changes, shortness of breath, chest pain, abdominal pain, severe nausea/vomiting, or problems with urination or bowel movements unless otherwise stated above. Pertinent History Reviewed:  Reviewed past medical,surgical, social, obstetrical and family history.  Reviewed problem list, medications and allergies. Physical Assessment:   Vitals:   05/26/18 1026  BP: 135/88  Pulse: (!) 111  Weight: 200 lb (90.7 kg)  Body mass index is 36.58 kg/m.        Physical Examination:   General appearance: Well appearing, and in no distress  Mental status: Alert, oriented to person, place, and time  Skin: Warm & dry  Cardiovascular: Normal heart rate noted  Respiratory: Normal respiratory effort, no distress  Abdomen: Soft, gravid, nontender  Pelvic: Cervical exam deferred         Extremities: Edema: None  Fetal Status: Fetal Heart Rate (bpm): 155   Movement: Absent    Results for orders placed or performed in visit on 05/26/18 (from the past 24 hour(s))  POC Urinalysis Dipstick OB   Collection Time: 05/26/18 10:26 AM  Result Value Ref Range   Color, UA     Clarity, UA     Glucose, UA Negative Negative   Bilirubin, UA     Ketones, UA neg    Spec Grav, UA     Blood, UA neg    pH, UA     POC,PROTEIN,UA Negative Negative, Trace   Urobilinogen, UA     Nitrite, UA neg    Leukocytes, UA Negative Negative   Appearance     Odor      Assessment & Plan:  1) Low-risk pregnancy G1P0000 at 248w2d with an Estimated Date of Delivery: 11/08/18   2) Hx of cerebral venous sinus thrombosis on lovenox 80 mg daily   Meds: No orders of the defined types were placed in this encounter.  Labs/procedures today: 2nd IT  Plan:  Continue routine obstetrical care , anatomy scan next visit  Reviewed:  labor symptoms and general obstetric precautions including but not limited to vaginal bleeding, contractions, leaking of fluid and fetal movement were reviewed in detail with the patient.  All questions were answered  Follow-up: Return in about 3 weeks (around 06/16/2018) for 20 week sono, LROB.  Orders Placed This Encounter  Procedures  . US OB Comp + 14 Wk  . INTEGRATED 2  . POC Urinalysis Dipstick OB   Amaryllis DykeLuther H Eure  05/26/2018 10:50 AM

## 2018-05-28 LAB — INTEGRATED 2
ADSF: 1.3
AFP MARKER: 44.2 ng/mL
AFP MoM: 1.72
CROWN RUMP LENGTH: 61.3 mm
DIA MOM: 1.86
DIA Value: 261.1 pg/mL
Estriol, Unconjugated: 1.08 ng/mL
Gest. Age on Collection Date: 12.4 weeks
Gestational Age: 16.1 weeks
MATERNAL AGE AT EDD: 27.7 a
NUCHAL TRANSLUCENCY (NT): 1.1 mm
NUMBER OF FETUSES: 1
Nuchal Translucency MoM: 0.8
PAPP-A MoM: 1.82
PAPP-A VALUE: 1201.4 ng/mL
TEST RESULTS: NEGATIVE
WEIGHT: 200 [lb_av]
Weight: 200 [lb_av]
hCG MoM: 1.49
hCG Value: 43.8 IU/mL

## 2018-06-15 ENCOUNTER — Other Ambulatory Visit: Payer: Self-pay | Admitting: Obstetrics & Gynecology

## 2018-06-15 DIAGNOSIS — Z363 Encounter for antenatal screening for malformations: Secondary | ICD-10-CM

## 2018-06-15 DIAGNOSIS — Z3402 Encounter for supervision of normal first pregnancy, second trimester: Secondary | ICD-10-CM

## 2018-06-16 ENCOUNTER — Ambulatory Visit (INDEPENDENT_AMBULATORY_CARE_PROVIDER_SITE_OTHER): Payer: BLUE CROSS/BLUE SHIELD

## 2018-06-16 ENCOUNTER — Ambulatory Visit (INDEPENDENT_AMBULATORY_CARE_PROVIDER_SITE_OTHER): Payer: BLUE CROSS/BLUE SHIELD | Admitting: Advanced Practice Midwife

## 2018-06-16 ENCOUNTER — Encounter: Payer: Self-pay | Admitting: Advanced Practice Midwife

## 2018-06-16 VITALS — BP 117/75 | HR 91 | Wt 201.0 lb

## 2018-06-16 DIAGNOSIS — Z363 Encounter for antenatal screening for malformations: Secondary | ICD-10-CM | POA: Diagnosis not present

## 2018-06-16 DIAGNOSIS — Z1389 Encounter for screening for other disorder: Secondary | ICD-10-CM

## 2018-06-16 DIAGNOSIS — Z3402 Encounter for supervision of normal first pregnancy, second trimester: Secondary | ICD-10-CM

## 2018-06-16 DIAGNOSIS — Z331 Pregnant state, incidental: Secondary | ICD-10-CM

## 2018-06-16 DIAGNOSIS — Z3A19 19 weeks gestation of pregnancy: Secondary | ICD-10-CM

## 2018-06-16 LAB — POCT URINALYSIS DIPSTICK OB
Blood, UA: NEGATIVE
GLUCOSE, UA: NEGATIVE
Ketones, UA: NEGATIVE
LEUKOCYTES UA: NEGATIVE
NITRITE UA: NEGATIVE
PROTEIN: NEGATIVE

## 2018-06-16 NOTE — Progress Notes (Signed)
  G1P0000 3772w2d Estimated Date of Delivery: 11/08/18  Blood pressure 117/75, pulse 91, weight 201 lb (91.2 kg), last menstrual period 02/01/2018.   BP weight and urine results all reviewed and noted.  Please refer to the obstetrical flow sheet for the fundal height and fetal heart rate documentation:  Patient denies any bleeding and no rupture of membranes symptoms or regular contractions. Patient is without complaints. No vomiting in 2 weeks.  All questions were answered.   Physical Assessment:   Vitals:   06/16/18 1431  BP: 117/75  Pulse: 91  Weight: 201 lb (91.2 kg)  Body mass index is 36.76 kg/m.        Physical Examination:   General appearance: Well appearing, and in no distress  Mental status: Alert, oriented to person, place, and time  Skin: Warm & dry  Cardiovascular: Normal heart rate noted  Respiratory: Normal respiratory effort, no distress  Abdomen: Soft, gravid, nontender  Pelvic: Cervical exam deferred         Extremities: Edema: None  Fetal Status: Fetal Heart Rate (bpm): 150 US   Movement: Absent   US 19+2 wks,breech,cx 3.9 cm,anterior placenta gr 0,normal ovaries bilat,svp of fluid 3.7 cm,fhr 171 bpm,simple left choroid plexus cyst 6 x 4 x 5 mm,efw 281 g 41%,limited view of the spine because of fetal position,I will try to re scan pt after Dr appt  Results for orders placed or performed in visit on 06/16/18 (from the past 24 hour(s))  POC Urinalysis Dipstick OB   Collection Time: 06/16/18  2:38 PM  Result Value Ref Range   Color, UA     Clarity, UA     Glucose, UA Negative Negative   Bilirubin, UA     Ketones, UA neg    Spec Grav, UA     Blood, UA neg    pH, UA     POC,PROTEIN,UA Negative Negative, Trace, Small (1+), Moderate (2+), Large (3+), 4+   Urobilinogen, UA     Nitrite, UA neg    Leukocytes, UA Negative Negative   Appearance     Odor       Orders Placed This Encounter  Procedures  . POC Urinalysis Dipstick OB    Plan:  Continued  routine obstetrical care,   Return in about 4 weeks (around 07/14/2018) for LROB.

## 2018-06-16 NOTE — Progress Notes (Signed)
US 19+2 wks,breech,cx 3.9 cm,anterior placenta gr 0,normal ovaries bilat,svp of fluid 3.7 cm,fhr 171 bpm,simple left choroid plexus cyst 6 x 4 x 5 mm,efw 281 g 41%,limited view of the spine because of fetal position,I will try to re scan pt after Dr appt.

## 2018-06-16 NOTE — Patient Instructions (Signed)
Maria BlackwaterAlison A Makki, I greatly value your feedback.  If you receive a survey following your visit with us today, we appreciate you taking the time to fill it out.  Thanks, Cathie BeamsFran Cresenzo-Dishmon, CNM     Second Trimester of Pregnancy The second trimester is from week 14 through week 27 (months 4 through 6). The second trimester is often a time when you feel your best. Your body has adjusted to being pregnant, and you begin to feel better physically. Usually, morning sickness has lessened or quit completely, you may have more energy, and you may have an increase in appetite. The second trimester is also a time when the fetus is growing rapidly. At the end of the sixth month, the fetus is about 9 inches long and weighs about 1 pounds. You will likely begin to feel the baby move (quickening) between 16 and 20 weeks of pregnancy. Body changes during your second trimester Your body continues to go through many changes during your second trimester. The changes vary from woman to woman.  Your weight will continue to increase. You will notice your lower abdomen bulging out.  You may begin to get stretch marks on your hips, abdomen, and breasts.  You may develop headaches that can be relieved by medicines. The medicines should be approved by your health care provider.  You may urinate more often because the fetus is pressing on your bladder.  You may develop or continue to have heartburn as a result of your pregnancy.  You may develop constipation because certain hormones are causing the muscles that push waste through your intestines to slow down.  You may develop hemorrhoids or swollen, bulging veins (varicose veins).  You may have back pain. This is caused by: ? Weight gain. ? Pregnancy hormones that are relaxing the joints in your pelvis. ? A shift in weight and the muscles that support your balance.  Your breasts will continue to grow and they will continue to become tender.  Your gums may  bleed and may be sensitive to brushing and flossing.  Dark spots or blotches (chloasma, mask of pregnancy) may develop on your face. This will likely fade after the baby is born.  A dark line from your belly button to the pubic area (linea nigra) may appear. This will likely fade after the baby is born.  You may have changes in your hair. These can include thickening of your hair, rapid growth, and changes in texture. Some women also have hair loss during or after pregnancy, or hair that feels dry or thin. Your hair will most likely return to normal after your baby is born.  What to expect at prenatal visits During a routine prenatal visit:  You will be weighed to make sure you and the fetus are growing normally.  Your blood pressure will be taken.  Your abdomen will be measured to track your baby's growth.  The fetal heartbeat will be listened to.  Any test results from the previous visit will be discussed.  Your health care provider may ask you:  How you are feeling.  If you are feeling the baby move.  If you have had any abnormal symptoms, such as leaking fluid, bleeding, severe headaches, or abdominal cramping.  If you are using any tobacco products, including cigarettes, chewing tobacco, and electronic cigarettes.  If you have any questions.  Other tests that may be performed during your second trimester include:  Blood tests that check for: ? Low iron levels (anemia). ?  High blood sugar that affects pregnant women (gestational diabetes) between 20 and 28 weeks. ? Rh antibodies. This is to check for a protein on red blood cells (Rh factor).  Urine tests to check for infections, diabetes, or protein in the urine.  An ultrasound to confirm the proper growth and development of the baby.  An amniocentesis to check for possible genetic problems.  Fetal screens for spina bifida and Down syndrome.  HIV (human immunodeficiency virus) testing. Routine prenatal testing  includes screening for HIV, unless you choose not to have this test.  Follow these instructions at home: Medicines  Follow your health care provider's instructions regarding medicine use. Specific medicines may be either safe or unsafe to take during pregnancy.  Take a prenatal vitamin that contains at least 600 micrograms (mcg) of folic acid.  If you develop constipation, try taking a stool softener if your health care provider approves. Eating and drinking  Eat a balanced diet that includes fresh fruits and vegetables, whole grains, good sources of protein such as meat, eggs, or tofu, and low-fat dairy. Your health care provider will help you determine the amount of weight gain that is right for you.  Avoid raw meat and uncooked cheese. These carry germs that can cause birth defects in the baby.  If you have low calcium intake from food, talk to your health care provider about whether you should take a daily calcium supplement.  Limit foods that are high in fat and processed sugars, such as fried and sweet foods.  To prevent constipation: ? Drink enough fluid to keep your urine clear or pale yellow. ? Eat foods that are high in fiber, such as fresh fruits and vegetables, whole grains, and beans. Activity  Exercise only as directed by your health care provider. Most women can continue their usual exercise routine during pregnancy. Try to exercise for 30 minutes at least 5 days a week. Stop exercising if you experience uterine contractions.  Avoid heavy lifting, wear low heel shoes, and practice good posture.  A sexual relationship may be continued unless your health care provider directs you otherwise. Relieving pain and discomfort  Wear a good support bra to prevent discomfort from breast tenderness.  Take warm sitz baths to soothe any pain or discomfort caused by hemorrhoids. Use hemorrhoid cream if your health care provider approves.  Rest with your legs elevated if you have  leg cramps or low back pain.  If you develop varicose veins, wear support hose. Elevate your feet for 15 minutes, 3-4 times a day. Limit salt in your diet. Prenatal Care  Write down your questions. Take them to your prenatal visits.  Keep all your prenatal visits as told by your health care provider. This is important. Safety  Wear your seat belt at all times when driving.  Make a list of emergency phone numbers, including numbers for family, friends, the hospital, and police and fire departments. General instructions  Ask your health care provider for a referral to a local prenatal education class. Begin classes no later than the beginning of month 6 of your pregnancy.  Ask for help if you have counseling or nutritional needs during pregnancy. Your health care provider can offer advice or refer you to specialists for help with various needs.  Do not use hot tubs, steam rooms, or saunas.  Do not douche or use tampons or scented sanitary pads.  Do not cross your legs for long periods of time.  Avoid cat litter boxes and  soil used by cats. These carry germs that can cause birth defects in the baby and possibly loss of the fetus by miscarriage or stillbirth.  Avoid all smoking, herbs, alcohol, and unprescribed drugs. Chemicals in these products can affect the formation and growth of the baby.  Do not use any products that contain nicotine or tobacco, such as cigarettes and e-cigarettes. If you need help quitting, ask your health care provider.  Visit your dentist if you have not gone yet during your pregnancy. Use a soft toothbrush to brush your teeth and be gentle when you floss. Contact a health care provider if:  You have dizziness.  You have mild pelvic cramps, pelvic pressure, or nagging pain in the abdominal area.  You have persistent nausea, vomiting, or diarrhea.  You have a bad smelling vaginal discharge.  You have pain when you urinate. Get help right away if:  You  have a fever.  You are leaking fluid from your vagina.  You have spotting or bleeding from your vagina.  You have severe abdominal cramping or pain.  You have rapid weight gain or weight loss.  You have shortness of breath with chest pain.  You notice sudden or extreme swelling of your face, hands, ankles, feet, or legs.  You have not felt your baby move in over an hour.  You have severe headaches that do not go away when you take medicine.  You have vision changes. Summary  The second trimester is from week 14 through week 27 (months 4 through 6). It is also a time when the fetus is growing rapidly.  Your body goes through many changes during pregnancy. The changes vary from woman to woman.  Avoid all smoking, herbs, alcohol, and unprescribed drugs. These chemicals affect the formation and growth your baby.  Do not use any tobacco products, such as cigarettes, chewing tobacco, and e-cigarettes. If you need help quitting, ask your health care provider.  Contact your health care provider if you have any questions. Keep all prenatal visits as told by your health care provider. This is important. This information is not intended to replace advice given to you by your health care provider. Make sure you discuss any questions you have with your health care provider.      CHILDBIRTH CLASSES [phone] is the phone number for Pregnancy Classes or hospital tours at [place] will be referred to  [email or web address] for more information on childbirth classes  At this site you may register for classes. You may sign up for a waiting list if classes are full. Please SIGN UP FOR THIS!.   When the waiting list becomes long, sometimes new classes can be added.

## 2018-07-15 NOTE — L&D Delivery Note (Signed)
LABOR COURSE Patient admitted for PROM and in early labor. She was augmented with Pitocin. She developed Triple I was given Amp and Gentamycin.   Delivery Note Called to room and patient was complete and pushing. Head delivered LOT. No nuchal cord present. Shoulder and body delivered in usual fashion. At 1146 a viable female was delivered via Vaginal, Spontaneous (Presentation: LOT; LOA).  Infant with spontaneous cry, placed on mother's abdomen, dried and stimulated. Cord clamped x 2 after two-minute delay, and cut by FOB. Cord blood drawn. Placenta delivered spontaneously with gentle cord traction. Appears intact. Fundus firm with massage and Pitocin. Labia, perineum, vagina, and cervix inspected inspected with second degree laceration.    APGAR:8, 9 ; weight 3816g .   Cord: 3VC with the following complications:none.     Anesthesia:  Epidural plus Lidocaine for perineal repair Episiotomy: None Lacerations: 2nd degree perineal Suture Repair: 3.0 vicryl rapide Est. Blood Loss (mL): 50  Mom to postpartum.  Baby to Couplet care / Skin to Skin.  [facility] messaged to schedule postpartum follow-up in 4-6 weeks Contraception currently undecided  Clayton Bibles, [place] 11/09/18  1:42 PM

## 2018-07-21 ENCOUNTER — Encounter: Payer: Self-pay | Admitting: Women's Health

## 2018-07-21 ENCOUNTER — Ambulatory Visit (INDEPENDENT_AMBULATORY_CARE_PROVIDER_SITE_OTHER): Payer: BLUE CROSS/BLUE SHIELD | Admitting: Women's Health

## 2018-07-21 ENCOUNTER — Other Ambulatory Visit: Payer: Self-pay

## 2018-07-21 VITALS — BP 130/80 | HR 113 | Wt 201.0 lb

## 2018-07-21 DIAGNOSIS — Z3402 Encounter for supervision of normal first pregnancy, second trimester: Secondary | ICD-10-CM

## 2018-07-21 DIAGNOSIS — Z331 Pregnant state, incidental: Secondary | ICD-10-CM

## 2018-07-21 DIAGNOSIS — Z3A24 24 weeks gestation of pregnancy: Secondary | ICD-10-CM

## 2018-07-21 DIAGNOSIS — Z1389 Encounter for screening for other disorder: Secondary | ICD-10-CM

## 2018-07-21 LAB — POCT URINALYSIS DIPSTICK OB
GLUCOSE, UA: NEGATIVE
Ketones, UA: NEGATIVE
LEUKOCYTES UA: NEGATIVE
Nitrite, UA: NEGATIVE
PROTEIN: NEGATIVE
RBC UA: NEGATIVE

## 2018-07-21 NOTE — Patient Instructions (Addendum)
Maria Brooks, I greatly value your feedback.  If you receive a survey following your visit with [place] today, we appreciate you taking the time to fill it out.  Thanks, Knute Neu, CNM, WHNP-BC   You will have your sugar test next visit.  Please do not eat or drink anything after midnight the night before you come, not even water.  You will be here for at least two hours.     Call the office [phone]) or go to [facility] if:  You begin to have strong, frequent contractions  Your water breaks.  Sometimes it is a big gush of fluid, sometimes it is just a trickle that keeps getting your panties wet or running down your legs  You have vaginal bleeding.  It is normal to have a small amount of spotting if your cervix was checked.   You don't feel your baby moving like normal.  If you don't, get you something to eat and drink and lay down and focus on feeling your baby move.   If your baby is still not moving like normal, you should call the office or go to [place] of Pregnancy The second trimester is from week 13 through week 28, months 4 through 6. The second trimester is often a time when you feel your best. Your body has also adjusted to being pregnant, and you begin to feel better physically. Usually, morning sickness has lessened or quit completely, you may have more energy, and you may have an increase in appetite. The second trimester is also a time when the fetus is growing rapidly. At the end of the sixth month, the fetus is about 9 inches long and weighs about 1 pounds. You will likely begin to feel the baby move (quickening) between 18 and 20 weeks of the pregnancy. BODY CHANGES Your body goes through many changes during pregnancy. The changes vary from woman to woman.   Your weight will continue to increase. You will notice your lower abdomen bulging out.  You may begin to get stretch marks on your hips, abdomen, and breasts.  You may develop headaches  that can be relieved by medicines approved by your health care provider.  You may urinate more often because the fetus is pressing on your bladder.  You may develop or continue to have heartburn as a result of your pregnancy.  You may develop constipation because certain hormones are causing the muscles that push waste through your intestines to slow down.  You may develop hemorrhoids or swollen, bulging veins (varicose veins).  You may have back pain because of the weight gain and pregnancy hormones relaxing your joints between the bones in your pelvis and as a result of a shift in weight and the muscles that support your balance.  Your breasts will continue to grow and be tender.  Your gums may bleed and may be sensitive to brushing and flossing.  Dark spots or blotches (chloasma, mask of pregnancy) may develop on your face. This will likely fade after the baby is born.  A dark line from your belly button to the pubic area (linea nigra) may appear. This will likely fade after the baby is born.  You may have changes in your hair. These can include thickening of your hair, rapid growth, and changes in texture. Some women also have hair loss during or after pregnancy, or hair that feels dry or thin. Your hair will most likely return to normal after your  baby is born. WHAT TO EXPECT AT YOUR PRENATAL VISITS During a routine prenatal visit:  You will be weighed to make sure you and the fetus are growing normally.  Your blood pressure will be taken.  Your abdomen will be measured to track your baby's growth.  The fetal heartbeat will be listened to.  Any test results from the previous visit will be discussed. Your health care provider may ask you:  How you are feeling.  If you are feeling the baby move.  If you have had any abnormal symptoms, such as leaking fluid, bleeding, severe headaches, or abdominal cramping.  If you have any questions. Other tests that may be performed  during your second trimester include:  Blood tests that check for:  Low iron levels (anemia).  Gestational diabetes (between 24 and 28 weeks).  Rh antibodies.  Urine tests to check for infections, diabetes, or protein in the urine.  An ultrasound to confirm the proper growth and development of the baby.  An amniocentesis to check for possible genetic problems.  Fetal screens for spina bifida and Down syndrome. HOME CARE INSTRUCTIONS   Avoid all smoking, herbs, alcohol, and unprescribed drugs. These chemicals affect the formation and growth of the baby.  Follow your health care provider's instructions regarding medicine use. There are medicines that are either safe or unsafe to take during pregnancy.  Exercise only as directed by your health care provider. Experiencing uterine cramps is a good sign to stop exercising.  Continue to eat regular, healthy meals.  Wear a good support bra for breast tenderness.  Do not use hot tubs, steam rooms, or saunas.  Wear your seat belt at all times when driving.  Avoid raw meat, uncooked cheese, cat litter boxes, and soil used by cats. These carry germs that can cause birth defects in the baby.  Take your prenatal vitamins.  Try taking a stool softener (if your health care provider approves) if you develop constipation. Eat more high-fiber foods, such as fresh vegetables or fruit and whole grains. Drink plenty of fluids to keep your urine clear or pale yellow.  Take warm sitz baths to soothe any pain or discomfort caused by hemorrhoids. Use hemorrhoid cream if your health care provider approves.  If you develop varicose veins, wear support hose. Elevate your feet for 15 minutes, 3-4 times a day. Limit salt in your diet.  Avoid heavy lifting, wear low heel shoes, and practice good posture.  Rest with your legs elevated if you have leg cramps or low back pain.  Visit your dentist if you have not gone yet during your pregnancy. Use a soft  toothbrush to brush your teeth and be gentle when you floss.  A sexual relationship may be continued unless your health care provider directs you otherwise.  Continue to go to all your prenatal visits as directed by your health care provider. SEEK MEDICAL CARE IF:   You have dizziness.  You have mild pelvic cramps, pelvic pressure, or nagging pain in the abdominal area.  You have persistent nausea, vomiting, or diarrhea.  You have a bad smelling vaginal discharge.  You have pain with urination. SEEK IMMEDIATE MEDICAL CARE IF:   You have a fever.  You are leaking fluid from your vagina.  You have spotting or bleeding from your vagina.  You have severe abdominal cramping or pain.  You have rapid weight gain or loss.  You have shortness of breath with chest pain.  You notice sudden or extreme  swelling of your face, hands, ankles, feet, or legs.  You have not felt your baby move in over an hour.  You have severe headaches that do not go away with medicine.  You have vision changes. Document Released: 06/25/2001 Document Revised: 07/06/2013 Document Reviewed: 09/01/2012 [facility] Patient Information 2015 [place], [place]. This information is not intended to replace advice given to you by your health care provider. Make sure you discuss any questions you have with your health care provider.  Thinking About Doren Custard???  Why consider waterbirth? . Gentle birth for babies . Less pain medicine used in labor . May allow for passive descent/less pushing . May reduce perineal tears  . More mobility and instinctive maternal position changes . Increased maternal relaxation . Reduced blood pressure in labor  Is waterbirth safe? What are the risks of infection, drowning or other complications? . Infection o Very low risk (3.7 % for tub vs 4.8% for bed) o 7 in 8000 waterbirths with documented infection o Poorly cleaned equipment most common cause o Slightly lower group B strep  transmission rate  . Drowning o Maternal:  - Very low risk   - Related to seizures or fainting o Newborn:  - Very low risk. No evidence of increased risk of respiratory problems in multiple large studies - Physiological protection from breathing under water - Avoid underwater birth if there are any fetal complications - Once baby's head is out of the water, keep it out.  . Birth complication o Some reports of cord trauma, but risk decreased by bringing baby to surface gradually o No evidence of increased risk of shoulder dystocia. Mothers can usually change positions faster in water than in a bed, possibly aiding the maneuvers to free the shoulder.  You must attend a Doren Custard class at [facility]  3rd Wednesday of every month from 7-9pm  Free  Register by calling [phone] or online at [email or web address]  Bring [place] the certificate from the class  Waterbirth supplies needed for [facility] patients:  Our practice has a [employer] manager in a Box tub (Regular size) at the hospital that you can borrow  You will need to purchase an accessory kit that has all needed supplies through [place] [phone] for kit, $65 for liner=$179+tax) or online through GotWebTools.is  Or you can purchase the supplies separately: o Single-use disposable tub liner for [employer] in a Box (REGULAR size) o New garden hose labeled "lead-free", "suitable for drinking water", "non-toxic" OR "water potable" o Garden hose to remove the dirty water o Electric drain pump to remove water (We recommend 792 gallon per hour or greater pump.)  o Fish net o Bathing suit top (optional) o Long-handled mirror (optional)  [employer]- sells EVERYTHING waterbirth related, accessory kits, tubs, etc  The [employer] (www.thelaborladies.com) this is a great service if you don't want to be responsible for the set up/take down of tub! Just call the Labor Ladies and they will come do it for  you for $200! This includes the rental fee for their tub, the accessory kit, set-up and take down   Things that would prevent you from having a waterbirth:  Premature, <37wks  Previous cesarean birth  Presence of thick meconium-stained fluid  Multiple gestation (Twins, triplets, etc.)  Uncontrolled diabetes  Hypertension  Heavy vaginal bleeding  Non-reassuring fetal heart rate  Active infection (MRSA, etc.)  If your labor has to be induced  Other risk issues identified by your obstetrical provider

## 2018-07-21 NOTE — Progress Notes (Signed)
   LOW-RISK PREGNANCY VISIT Patient name: Maria Brooks MRN [id number]  Date of birth: [birth year] Chief Complaint:   Routine Prenatal Visit  History of Present Illness:   Maria Blackwaterlison A Date is a 28 y.o. 801P0000 female at 1037w2d with an Estimated Date of Delivery: 11/08/18 being seen today for ongoing management of a low-risk pregnancy.  Today she reports no complaints. Has many questions, feels unprepared. Likes to plan. May be interested in waterbirth. Prefers CNMs/females if possible.   . Vag. Bleeding: None.  Movement: Present. denies leaking of fluid. Review of Systems:   Pertinent items are noted in HPI Denies abnormal vaginal discharge w/ itching/odor/irritation, headaches, visual changes, shortness of breath, chest pain, abdominal pain, severe nausea/vomiting, or problems with urination or bowel movements unless otherwise stated above. Pertinent History Reviewed:  Reviewed past medical,surgical, social, obstetrical and family history.  Reviewed problem list, medications and allergies. Physical Assessment:   Vitals:   07/21/18 1102  BP: 130/80  Pulse: (!) 113  Weight: 201 lb (91.2 kg)  Body mass index is 36.76 kg/m.        Physical Examination:   General appearance: Well appearing, and in no distress  Mental status: Alert, oriented to person, place, and time  Skin: Warm & dry  Cardiovascular: Normal heart rate noted  Respiratory: Normal respiratory effort, no distress  Abdomen: Soft, gravid, nontender  Pelvic: Cervical exam deferred         Extremities: Edema: None  Fetal Status: Fetal Heart Rate (bpm): 160 Fundal Height: 24 cm Movement: Present    Results for orders placed or performed in visit on 07/21/18 (from the past 24 hour(s))  POC Urinalysis Dipstick OB   Collection Time: 07/21/18 11:02 AM  Result Value Ref Range   Color, UA     Clarity, UA     Glucose, UA Negative Negative   Bilirubin, UA     Ketones, UA neg    Spec Grav, UA     Blood, UA neg    pH, UA     POC,PROTEIN,UA Negative Negative, Trace, Small (1+), Moderate (2+), Large (3+), 4+   Urobilinogen, UA     Nitrite, UA neg    Leukocytes, UA Negative Negative   Appearance     Odor      Assessment & Plan:  1) Low-risk pregnancy G1P0000 at 1137w2d with an Estimated Date of Delivery: 11/08/18   2) H/O cerebral venous sinus thrombosis, continue Lovenox 80mg  daily  3) Interested in waterbirth> discussed, gave printed info, sign up for class. Highly recommended childbirth classes to help her feel more prepared in general.  4) Isolated CPC> neg nt/it, no further u/s needed. Discussed/gave pt printed info  Meds: No orders of the defined types were placed in this encounter.  Labs/procedures today: none  Plan:  Continue routine obstetrical care   Reviewed: Preterm labor symptoms and general obstetric precautions including but not limited to vaginal bleeding, contractions, leaking of fluid and fetal movement were reviewed in detail with the patient.  All questions were answered  Follow-up: Return in about 4 weeks (around 08/18/2018) for LROB, PN2.  Orders Placed This Encounter  Procedures  . POC Urinalysis Dipstick OB   Cheral MarkerKimberly R Mortimer Bair CNM, [facility] 07/21/2018 12:03 PM

## 2018-08-18 ENCOUNTER — Ambulatory Visit (INDEPENDENT_AMBULATORY_CARE_PROVIDER_SITE_OTHER): Payer: BLUE CROSS/BLUE SHIELD | Admitting: Obstetrics & Gynecology

## 2018-08-18 ENCOUNTER — Other Ambulatory Visit: Payer: Self-pay

## 2018-08-18 ENCOUNTER — Encounter: Payer: Self-pay | Admitting: Obstetrics & Gynecology

## 2018-08-18 ENCOUNTER — Other Ambulatory Visit: Payer: BLUE CROSS/BLUE SHIELD

## 2018-08-18 VITALS — BP 138/87 | HR 133 | Wt 204.0 lb

## 2018-08-18 DIAGNOSIS — Z86718 Personal history of other venous thrombosis and embolism: Secondary | ICD-10-CM

## 2018-08-18 DIAGNOSIS — Z331 Pregnant state, incidental: Secondary | ICD-10-CM

## 2018-08-18 DIAGNOSIS — Z3403 Encounter for supervision of normal first pregnancy, third trimester: Secondary | ICD-10-CM

## 2018-08-18 DIAGNOSIS — Z1389 Encounter for screening for other disorder: Secondary | ICD-10-CM

## 2018-08-18 DIAGNOSIS — Z23 Encounter for immunization: Secondary | ICD-10-CM

## 2018-08-18 DIAGNOSIS — O0993 Supervision of high risk pregnancy, unspecified, third trimester: Secondary | ICD-10-CM

## 2018-08-18 DIAGNOSIS — Z3A28 28 weeks gestation of pregnancy: Secondary | ICD-10-CM

## 2018-08-18 LAB — POCT URINALYSIS DIPSTICK OB
Glucose, UA: NEGATIVE
KETONES UA: NEGATIVE
Nitrite, UA: NEGATIVE
RBC UA: NEGATIVE

## 2018-08-18 NOTE — Progress Notes (Signed)
   HIGH-RISK PREGNANCY VISIT Patient name: Maria Brooks MRN [id number]  Date of birth: 12/20/1990 Chief Complaint:   Routine Prenatal Visit (PN2 today)  History of Present Illness:   Maria Brooks is a 28 y.o. G67P0000 female at [redacted]w[redacted]d with an Estimated Date of Delivery: 11/08/18 being seen today for ongoing management of a high-risk pregnancy complicated by history of venous sinus thrombosis.  Today she reports no complaints. Contractions: Not present. Vag. Bleeding: None.  Movement: Present. denies leaking of fluid.  Review of Systems:   Pertinent items are noted in HPI Denies abnormal vaginal discharge w/ itching/odor/irritation, headaches, visual changes, shortness of breath, chest pain, abdominal pain, severe nausea/vomiting, or problems with urination or bowel movements unless otherwise stated above. Pertinent History Reviewed:  Reviewed past medical,surgical, social, obstetrical and family history.  Reviewed problem list, medications and allergies. Physical Assessment:   Vitals:   08/18/18 0926  BP: 138/87  Pulse: (!) 133  Weight: 204 lb (92.5 kg)  Body mass index is 37.31 kg/m.           Physical Examination:   General appearance: alert, well appearing, and in no distress  Mental status: alert, oriented to person, place, and time  Skin: warm & dry   Extremities: Edema: None    Cardiovascular: normal heart rate noted  Respiratory: normal respiratory effort, no distress  Abdomen: gravid, soft, non-tender  Pelvic: deferred         Fetal Status: Fetal Heart Rate (bpm): 155 Fundal Height: 29 cm Movement: Present    Fetal Surveillance Testing today: FHR 155   Results for orders placed or performed in visit on 08/18/18 (from the past 24 hour(s))  POC Urinalysis Dipstick OB   Collection Time: 08/18/18  9:29 AM  Result Value Ref Range   Color, UA     Clarity, UA     Glucose, UA Negative Negative   Bilirubin, UA     Ketones, UA neg    Spec Grav, UA     Blood, UA neg    pH, UA     POC,PROTEIN,UA Trace Negative, Trace, Small (1+), Moderate (2+), Large (3+), 4+   Urobilinogen, UA     Nitrite, UA neg    Leukocytes, UA Small (1+) (A) Negative   Appearance     Odor      Assessment & Plan:  1) High-risk pregnancy G1P0000 at [redacted]w[redacted]d with an Estimated Date of Delivery: 11/08/18   2) History of Venous sinus thrombosis, stable, lovenox 80 mg daily  3) Borderline BP today, stable  Meds: No orders of the defined types were placed in this encounter.   Labs/procedures today: PN2  Treatment Plan:  See a little quicker due to BP, continue lovenox, will release PN 2 labs when return  Reviewed: Preterm labor symptoms and general obstetric precautions including but not limited to vaginal bleeding, contractions, leaking of fluid and fetal movement were reviewed in detail with the patient.  All questions were answered.  Follow-up: Return in about 2 weeks (around 09/01/2018) for HROB.  Orders Placed This Encounter  Procedures  . Tdap vaccine greater than or equal to 7yo IM  . POC Urinalysis Dipstick OB   Lazaro Arms  08/18/2018 9:43 AM

## 2018-08-19 LAB — RPR: RPR: NONREACTIVE

## 2018-08-19 LAB — GLUCOSE TOLERANCE, 2 HOURS W/ 1HR
Glucose, 1 hour: 116 mg/dL (ref 65–179)
Glucose, 2 hour: 104 mg/dL (ref 65–152)
Glucose, Fasting: 84 mg/dL (ref 65–91)

## 2018-08-19 LAB — CBC
Hematocrit: 36.5 % (ref 34.0–46.6)
Hemoglobin: 11.7 g/dL (ref 11.1–15.9)
MCH: 27.7 pg (ref 26.6–33.0)
MCHC: 32.1 g/dL (ref 31.5–35.7)
MCV: 86 fL (ref 79–97)
Platelets: 214 10*3/uL (ref 150–450)
RBC: 4.23 x10E6/uL (ref 3.77–5.28)
RDW: 12.8 % (ref 11.7–15.4)
WBC: 9.7 10*3/uL (ref 3.4–10.8)

## 2018-08-19 LAB — HIV ANTIBODY (ROUTINE TESTING W REFLEX): HIV SCREEN 4TH GENERATION: NONREACTIVE

## 2018-08-19 LAB — ANTIBODY SCREEN: ANTIBODY SCREEN: NEGATIVE

## 2018-09-01 ENCOUNTER — Encounter: Payer: Self-pay | Admitting: Women's Health

## 2018-09-01 ENCOUNTER — Ambulatory Visit (INDEPENDENT_AMBULATORY_CARE_PROVIDER_SITE_OTHER): Payer: BLUE CROSS/BLUE SHIELD | Admitting: Women's Health

## 2018-09-01 VITALS — BP 134/89 | HR 117 | Wt 206.4 lb

## 2018-09-01 DIAGNOSIS — Z1389 Encounter for screening for other disorder: Secondary | ICD-10-CM

## 2018-09-01 DIAGNOSIS — Z3403 Encounter for supervision of normal first pregnancy, third trimester: Secondary | ICD-10-CM

## 2018-09-01 DIAGNOSIS — Z3A30 30 weeks gestation of pregnancy: Secondary | ICD-10-CM

## 2018-09-01 DIAGNOSIS — Z331 Pregnant state, incidental: Secondary | ICD-10-CM

## 2018-09-01 LAB — POCT URINALYSIS DIPSTICK OB
Glucose, UA: NEGATIVE
KETONES UA: NEGATIVE
Leukocytes, UA: NEGATIVE
NITRITE UA: NEGATIVE
RBC UA: NEGATIVE

## 2018-09-01 NOTE — Progress Notes (Signed)
   LOW-RISK PREGNANCY VISIT Patient name: Maria Brooks MRN [id number]  Date of birth: 10/29/1990 Chief Complaint:   Routine Prenatal Visit  History of Present Illness:   Maria Brooks is a 28 y.o. G51P0000 female at [redacted]w[redacted]d with an Estimated Date of Delivery: 11/08/18 being seen today for ongoing management of a low-risk pregnancy.  Today she reports no complaints. Has waterbirth class next month. Has been out of Lovenox for a few weeks, going to pick up today. Denies ha, visual changes, ruq/epigastric pain, n/v.   Contractions: Not present.  .  Movement: Present. denies leaking of fluid. Review of Systems:   Pertinent items are noted in HPI Denies abnormal vaginal discharge w/ itching/odor/irritation, headaches, visual changes, shortness of breath, chest pain, abdominal pain, severe nausea/vomiting, or problems with urination or bowel movements unless otherwise stated above. Pertinent History Reviewed:  Reviewed past medical,surgical, social, obstetrical and family history.  Reviewed problem list, medications and allergies. Physical Assessment:   Vitals:   09/01/18 1023 09/01/18 1107  BP: 134/89   Pulse: (!) 142 (!) 117  Weight: 206 lb 6.4 oz (93.6 kg)   Body mass index is 37.75 kg/m.        Physical Examination:   General appearance: Well appearing, and in no distress  Mental status: Alert, oriented to person, place, and time  Skin: Warm & dry  Cardiovascular: Normal heart rate noted  Respiratory: Normal respiratory effort, no distress  Abdomen: Soft, gravid, nontender  Pelvic: Cervical exam deferred         Extremities: Edema: Trace  Fetal Status: Fetal Heart Rate (bpm): 153 Fundal Height: 31 cm Movement: Present    Results for orders placed or performed in visit on 09/01/18 (from the past 24 hour(s))  POC Urinalysis Dipstick OB   Collection Time: 09/01/18 10:31 AM  Result Value Ref Range   Color, UA     Clarity, UA     Glucose, UA Negative Negative   Bilirubin, UA      Ketones, UA neg    Spec Grav, UA     Blood, UA neg    pH, UA     POC,PROTEIN,UA Trace Negative, Trace, Small (1+), Moderate (2+), Large (3+), 4+   Urobilinogen, UA     Nitrite, UA neg    Leukocytes, UA Negative Negative   Appearance     Odor      Assessment & Plan:  1) Low-risk pregnancy G1P0000 at [redacted]w[redacted]d with an Estimated Date of Delivery: 11/08/18   2) H/O DVT, Lovenox 80mg  daily, ran out for few weeks, get today!  3) Interested in waterbirth> class next month  4) High-normal BP> asymptomatic, reviewed pre-e s/s, f/u 1wk   Meds: No orders of the defined types were placed in this encounter.  Labs/procedures today: none  Plan:  Continue routine obstetrical care   Reviewed: Preterm labor symptoms and general obstetric precautions including but not limited to vaginal bleeding, contractions, leaking of fluid and fetal movement were reviewed in detail with the patient.  All questions were answered  Follow-up: Return in about 1 week (around 09/08/2018) for LROB.  Orders Placed This Encounter  Procedures  . POC Urinalysis Dipstick OB   Cheral Marker CNM, [facility] 09/01/2018 11:08 AM

## 2018-09-01 NOTE — Patient Instructions (Addendum)
Maria Brooks, I greatly value your feedback.  If you receive a survey following your visit with [place] today, we appreciate you taking the time to fill it out.  Thanks, Maria Brooks, CNM, [facility]  [place]!!! to [facility] ([place], [place] [address]) on Sunday September 06, 2018 at 5:00am It will be called the [place], and it is located off of [place] ([facility]) on February 23rd or after, no one will be there!     Call the office [phone]) or go to [facility] if:  You begin to have strong, frequent contractions  Your water breaks.  Sometimes it is a big gush of fluid, sometimes it is just a trickle that keeps getting your panties wet or running down your legs  You have vaginal bleeding.  It is normal to have a small amount of spotting if your cervix was checked.   You don't feel your baby moving like normal.  If you don't, get you something to eat and drink and lay down and focus on feeling your baby move.  You should feel at least 10 movements in 2 hours.  If you don't, you should call the office or go to [facility].   Call the office [phone]) or go to [facility] hospital for these signs of pre-eclampsia:  Severe headache that does not go away with Tylenol  Visual changes- seeing spots, double, blurred vision  Pain under your right breast or upper abdomen that does not go away with Tums or heartburn medicine  Nausea and/or vomiting  Severe swelling in your hands, feet, and face   Tips to Help You Sleep Better:   Get into a bedtime routine, try to do the same thing every night before going to bed to try to help your body wind down  Warm baths  Avoid caffeine for at least 3 hours before going to sleep   Keep your room at a slightly cooler temperature, can try running a fan  Turn off TV, lights, phone, electronics  Lots of pillows if needed to help  you get comfortable  Lavender scented items can help you sleep. You can place lavender essential oil on a cotton ball and place under your pillowcase, or place in a diffuser. [employer] has a lavender scented sleep line (plug-ins, sprays, etc). Look in the pillow aisle for lavender scented pillows.   If none of the above things help, you can try 1/2 to 1 tablet of benadryl, unisom, or tylenol pm. Do not take this every night, only when you really need it.        Preterm Labor and Birth Information  The normal length of a pregnancy is 39-41 weeks. Preterm labor is when labor starts before 37 completed weeks of pregnancy. What are the risk factors for preterm labor? Preterm labor is more likely to occur in women who:  Have certain infections during pregnancy such as a bladder infection, sexually transmitted infection, or infection inside the uterus (chorioamnionitis).  Have a shorter-than-normal cervix.  Have gone into preterm labor before.  Have had surgery on their cervix.  Are younger than age 15 or older than age 25.  Are African American.  Are pregnant with twins or multiple babies (multiple gestation).  Take street drugs or smoke while pregnant.  Do not gain enough weight while pregnant.  Became pregnant shortly after having been pregnant. What  are the symptoms of preterm labor? Symptoms of preterm labor include:  Cramps similar to those that can happen during a menstrual period. The cramps may happen with diarrhea.  Pain in the abdomen or lower back.  Regular uterine contractions that may feel like tightening of the abdomen.  A feeling of increased pressure in the pelvis.  Increased watery or bloody mucus discharge from the vagina.  Water breaking (ruptured amniotic sac). Why is it important to recognize signs of preterm labor? It is important to recognize signs of preterm labor because babies who are born prematurely may not be fully developed. This can put them  at an increased risk for:  Long-term (chronic) heart and lung problems.  Difficulty immediately after birth with regulating body systems, including blood sugar, body temperature, heart rate, and breathing rate.  Bleeding in the brain.  Cerebral palsy.  Learning difficulties.  Death. These risks are highest for babies who are born before 23 weeks of pregnancy. How is preterm labor treated? Treatment depends on the length of your pregnancy, your condition, and the health of your baby. It may involve:  Having a stitch (suture) placed in your cervix to prevent your cervix from opening too early (cerclage).  Taking or being given medicines, such as: ? Hormone medicines. These may be given early in pregnancy to help support the pregnancy. ? Medicine to stop contractions. ? Medicines to help mature the baby's lungs. These may be prescribed if the risk of delivery is high. ? Medicines to prevent your baby from developing cerebral palsy. If the labor happens before 34 weeks of pregnancy, you may need to stay in the hospital. What should I do if I think I am in preterm labor? If you think that you are going into preterm labor, call your health care provider right away. How can I prevent preterm labor in future pregnancies? To increase your chance of having a full-term pregnancy:  Do not use any tobacco products, such as cigarettes, chewing tobacco, and e-cigarettes. If you need help quitting, ask your health care provider.  Do not use street drugs or medicines that have not been prescribed to you during your pregnancy.  Talk with your health care provider before taking any herbal supplements, even if you have been taking them regularly.  Make sure you gain a healthy amount of weight during your pregnancy.  Watch for infection. If you think that you might have an infection, get it checked right away.  Make sure to tell your health care provider if you have gone into preterm labor  before. This information is not intended to replace advice given to you by your health care provider. Make sure you discuss any questions you have with your health care provider. Document Released: 09/21/2003 Document Revised: 12/12/2015 Document Reviewed: 11/22/2015 Elsevier Interactive Patient Education  2019 Man? Guide for patients at [facility] [employer]  Why consider waterbirth?  . Gentle birth for babies . Less pain medicine used in labor . May allow for passive descent/less pushing . May reduce perineal tears  . More mobility and instinctive maternal position changes . Increased maternal relaxation . Reduced blood pressure in labor  Is waterbirth safe? What are the risks of infection, drowning or other complications?  . Infection: o Very low risk (3.7 % for tub vs 4.8% for bed) o 7 in 8000 waterbirths with documented infection o Poorly cleaned equipment most common cause o Slightly lower group B strep transmission rate  .  Drowning o Maternal:  - Very low risk   - Related to seizures or fainting o Newborn:  - Very low risk. No evidence of increased risk of respiratory problems in multiple large studies - Physiological protection from breathing under water - Avoid underwater birth if there are any fetal complications - Once baby's head is out of the water, keep it out.  . Birth complication o Some reports of cord trauma, but risk decreased by bringing baby to surface gradually o No evidence of increased risk of shoulder dystocia. Mothers can usually change positions faster in water than in a bed, possibly aiding the maneuvers to free the shoulder.   You must attend a Doren Custard class at [facility]  3rd Wednesday of every month from 7-9pm  [employer] by calling [phone] or online at [email or web address]  Bring [place] the certificate from the class to your prenatal appointment  Meet with a midwife at 36  weeks to see if you can still plan a waterbirth and to sign the consent.   If you plan a waterbirth after September 06, 2018, at [facility], you will need to purchase the following:  Fish net  Bathing suit top (optional)  Long-handled mirror (optional)  If you plan a waterbirth before September 06, 2018: Purchase or rent the following supplies: You are responsible for providing all supplies listed above. **If you do not have all necessary supplies you cannot have a waterbirth.**   Water Birth Pool (Birth Pool in a Box or [place] for instance)  (Tubs start ~$125)  Single-use disposable tub liner designed for your brand of tub  Electric drain pump to remove water (We recommend 792 gallon per hour or greater pump.)   New garden hose labeled "lead-free", "suitable for drinking water",  Separate garden hose to remove the dirty water  Fish net  Bathing suit top (optional)  Long-handled mirror (optional)  Places to purchase or rent supplies:   [employer] for tub purchases and supplies  [employer]com for tub purchases and supplies  [employer] (www.thelaborladies.com) $275 for tub rental/set-up & take down/kit   [employer] (http://www.fleming.com/.htm) Information regarding doulas (labor support) who provide pool rentals  Things that would prevent you from having a waterbirth:  Premature, <37wks  Previous cesarean birth  Presence of thick meconium-stained fluid  Multiple gestation (Twins, triplets, etc.)  Uncontrolled diabetes or gestational diabetes requiring medication  Hypertension requiring medication or diagnosis of pre-eclampsia  Heavy vaginal bleeding  Non-reassuring fetal heart rate  Active infection (MRSA, etc.). Group B Strep is NOT a contraindication for waterbirth.  If your labor has to be induced and induction method requires continuous monitoring of the baby's  heart rate  Other risks/issues identified by your obstetrical provider  Please remember that birth is unpredictable. Under certain unforeseeable circumstances your provider may advise against giving birth in the tub. These decisions will be made on a case-by-case basis and with the safety of you and your baby as our highest priority.

## 2018-09-08 ENCOUNTER — Ambulatory Visit (INDEPENDENT_AMBULATORY_CARE_PROVIDER_SITE_OTHER): Payer: BLUE CROSS/BLUE SHIELD | Admitting: Obstetrics and Gynecology

## 2018-09-08 ENCOUNTER — Encounter: Payer: Self-pay | Admitting: Obstetrics and Gynecology

## 2018-09-08 VITALS — BP 124/82 | HR 108 | Wt 208.0 lb

## 2018-09-08 DIAGNOSIS — Z331 Pregnant state, incidental: Secondary | ICD-10-CM

## 2018-09-08 DIAGNOSIS — Z1389 Encounter for screening for other disorder: Secondary | ICD-10-CM

## 2018-09-08 DIAGNOSIS — Z3403 Encounter for supervision of normal first pregnancy, third trimester: Secondary | ICD-10-CM

## 2018-09-08 DIAGNOSIS — Z3A31 31 weeks gestation of pregnancy: Secondary | ICD-10-CM

## 2018-09-08 LAB — POCT URINALYSIS DIPSTICK OB
Blood, UA: NEGATIVE
GLUCOSE, UA: NEGATIVE
KETONES UA: NEGATIVE
Nitrite, UA: NEGATIVE
POC,PROTEIN,UA: NEGATIVE

## 2018-09-08 NOTE — Progress Notes (Addendum)
Patient ID: Maria Brooks, female   DOB: 10-Sep-1990, 28 y.o.   MRN: [id number]    LOW-RISK PREGNANCY VISIT Patient name: Maria Brooks MRN [id number]  Date of birth: [birth year] Chief Complaint:   No chief complaint on file.  History of Present Illness:   Maria Brooks is a 28 y.o. G1P0000 female at [redacted]w[redacted]d with an Estimated Date of Delivery: 11/08/18 being seen today for ongoing management of a low-risk pregnancy.  Hx of Cerebral venous sinus thrombosis. Is a wedding planner by profession and is accompanied by FOB she plans on taking breast-feeding classes, water birth classes as well as childbirth classes Today she reports no complaints.  .  .   . denies leaking of fluid. Review of Systems:   Pertinent items are noted in HPI Denies abnormal vaginal discharge w/ itching/odor/irritation, headaches, visual changes, shortness of breath, chest pain, abdominal pain, severe nausea/vomiting, or problems with urination or bowel movements unless otherwise stated above. Pertinent History Reviewed:  Reviewed past medical,surgical, social, obstetrical and family history.  Reviewed problem list, medications and allergies. Physical Assessment:  There were no vitals filed for this visit.There is no height or weight on file to calculate BMI.        Physical Examination:   General appearance: Well appearing, and in no distress  Mental status: Alert, oriented to person, place, and time  Skin: Warm & dry  Cardiovascular: Normal heart rate noted  Respiratory: Normal respiratory effort, no distress  Abdomen: Soft, gravid, nontender  Pelvic: Cervical exam deferred         Extremities:    Fetal Status:          No results found for this or any previous visit (from the past 24 hour(s)).  Assessment & Plan:  1) Low-risk pregnancy G1P0000 at [redacted]w[redacted]d with an Estimated Date of Delivery: 11/08/18    Meds: No orders of the defined types were placed in this encounter.  Labs/procedures today: None  Plan:     1.Continue routine obstetrical care  2. F.u in 2 weeks  Reviewed: Preterm labor symptoms and general obstetric precautions including but not limited to vaginal bleeding, contractions, leaking of fluid and fetal movement were reviewed in detail with the patient.  All questions were answered  Follow-up: No follow-ups on file.  No orders of the defined types were placed in this encounter.  By signing my name below, I, Arnette Norris, attest that this documentation has been prepared under the direction and in the presence of Tilda Burrow, MD. Electronically Signed: Arnette Norris Medical Scribe. 09/08/18. 3:32 PM.  I personally performed the services described in this documentation, which was SCRIBED in my presence. The recorded information has been reviewed and considered accurate. It has been edited as necessary during review. Tilda Burrow, MD

## 2018-09-22 ENCOUNTER — Encounter: Payer: Self-pay | Admitting: Family Medicine

## 2018-09-22 ENCOUNTER — Other Ambulatory Visit: Payer: Self-pay

## 2018-09-22 ENCOUNTER — Ambulatory Visit (INDEPENDENT_AMBULATORY_CARE_PROVIDER_SITE_OTHER): Payer: BLUE CROSS/BLUE SHIELD | Admitting: Family Medicine

## 2018-09-22 VITALS — BP 132/87 | HR 112 | Wt 205.0 lb

## 2018-09-22 DIAGNOSIS — O0993 Supervision of high risk pregnancy, unspecified, third trimester: Secondary | ICD-10-CM

## 2018-09-22 DIAGNOSIS — O0990 Supervision of high risk pregnancy, unspecified, unspecified trimester: Secondary | ICD-10-CM

## 2018-09-22 DIAGNOSIS — Z1389 Encounter for screening for other disorder: Secondary | ICD-10-CM

## 2018-09-22 DIAGNOSIS — Z331 Pregnant state, incidental: Secondary | ICD-10-CM

## 2018-09-22 DIAGNOSIS — Z86718 Personal history of other venous thrombosis and embolism: Secondary | ICD-10-CM

## 2018-09-22 DIAGNOSIS — Z3A33 33 weeks gestation of pregnancy: Secondary | ICD-10-CM

## 2018-09-22 LAB — POCT URINALYSIS DIPSTICK OB
Blood, UA: NEGATIVE
Glucose, UA: NEGATIVE
Ketones, UA: NEGATIVE
NITRITE UA: NEGATIVE
POC,PROTEIN,UA: NEGATIVE

## 2018-09-22 NOTE — Progress Notes (Signed)
   HIGH-RISK PREGNANCY VISIT Patient name: Maria Brooks MRN [id number]  Date of birth: 09-Jul-1991 Chief Complaint:   Routine Prenatal Visit  History of Present Illness:   Maria Brooks is a 28 y.o. G64P0000 female at [redacted]w[redacted]d with an Estimated Date of Delivery: 11/08/18 being seen today for ongoing management of a high-risk pregnancy complicated by history of central venous thrombosis and currently on anticoagulation.  Today she reports no complaints. Contractions: Not present. Vag. Bleeding: None.  Movement: Present. denies leaking of fluid.  Review of Systems:   Pertinent items are noted in HPI Denies abnormal vaginal discharge w/ itching/odor/irritation, headaches, visual changes, shortness of breath, chest pain, abdominal pain, severe nausea/vomiting, or problems with urination or bowel movements unless otherwise stated above. Pertinent History Reviewed:  Reviewed past medical,surgical, social, obstetrical and family history.  Reviewed problem list, medications and allergies. Physical Assessment:   Vitals:   09/22/18 1205  BP: 132/87  Pulse: (!) 112  Weight: 205 lb (93 kg)  Body mass index is 37.49 kg/m.           Physical Examination:   General appearance: alert, well appearing, and in no distress  Mental status: alert, oriented to person, place, and time  Skin: warm & dry   Extremities: Edema: None    Cardiovascular: normal heart rate noted  Respiratory: normal respiratory effort, no distress  Abdomen: gravid, soft, non-tender  Pelvic: Cervical exam deferred         Fetal Status:     Movement: Present    Fetal Surveillance Testing today: None   Results for orders placed or performed in visit on 09/22/18 (from the past 24 hour(s))  POC Urinalysis Dipstick OB   Collection Time: 09/22/18 12:04 PM  Result Value Ref Range   Color, UA     Clarity, UA     Glucose, UA Negative Negative   Bilirubin, UA     Ketones, UA neg    Spec Grav, UA     Blood, UA neg    pH, UA      POC,PROTEIN,UA Negative Negative, Trace, Small (1+), Moderate (2+), Large (3+), 4+   Urobilinogen, UA     Nitrite, UA neg    Leukocytes, UA Moderate (2+) (A) Negative   Appearance     Odor      Assessment & Plan:  1) High-risk pregnancy G1P0000 at [redacted]w[redacted]d with an Estimated Date of Delivery: 11/08/18   2) h/o Central Venous Thrombosis, stable  3) Supervision high risk pregnancy, stable  Meds: No orders of the defined types were placed in this encounter.   Labs/procedures today: None  Treatment Plan:  Continue anticoagulation, hold in labor and resume 24 hrs after delivery   Reviewed: Preterm labor symptoms and general obstetric precautions including but not limited to vaginal bleeding, contractions, leaking of fluid and fetal movement were reviewed in detail with the patient.  All questions were answered.  Follow-up: Return in about 2 weeks (around 10/06/2018) for Routine prenatal care.  Orders Placed This Encounter  Procedures  . POC Urinalysis Dipstick OB   Isa Rankin [facility]  09/22/2018 12:44 PM

## 2018-10-05 ENCOUNTER — Telehealth: Payer: Self-pay | Admitting: *Deleted

## 2018-10-05 NOTE — Telephone Encounter (Signed)
Called patient and left detailed message about COVID 19 restrictions.  

## 2018-10-06 ENCOUNTER — Encounter: Payer: BLUE CROSS/BLUE SHIELD | Admitting: Women's Health

## 2018-10-06 ENCOUNTER — Telehealth: Payer: Self-pay | Admitting: *Deleted

## 2018-10-06 NOTE — Telephone Encounter (Signed)
Patient states she awoke this morning with diarrhea.  Has appt today and wants to know if she should reschedule.  Advised patient to reschedule for later this week. Can take Immodium for the diarrhea as well.  Verbalized understanding.

## 2018-10-07 ENCOUNTER — Telehealth: Payer: Self-pay | Admitting: *Deleted

## 2018-10-07 NOTE — Telephone Encounter (Signed)
Called patient and informed her that we are not allowing any visitors or children to accompany her to her visit. Patient denies fever, cough, SOB, muscle pain, diarrhea, Gaia Gullikson, vomiting, abdominal pain, red eye, weakness, bruising or bleeding, joint pain or a severe headache.  

## 2018-10-08 ENCOUNTER — Encounter: Payer: BLUE CROSS/BLUE SHIELD | Admitting: Women's Health

## 2018-10-08 ENCOUNTER — Other Ambulatory Visit: Payer: Self-pay

## 2018-10-14 ENCOUNTER — Telehealth: Payer: Self-pay | Admitting: *Deleted

## 2018-10-14 NOTE — Telephone Encounter (Signed)

## 2018-10-15 ENCOUNTER — Ambulatory Visit (INDEPENDENT_AMBULATORY_CARE_PROVIDER_SITE_OTHER): Payer: BLUE CROSS/BLUE SHIELD | Admitting: Advanced Practice Midwife

## 2018-10-15 ENCOUNTER — Other Ambulatory Visit: Payer: Self-pay

## 2018-10-15 ENCOUNTER — Encounter: Payer: Self-pay | Admitting: Advanced Practice Midwife

## 2018-10-15 VITALS — BP 134/86 | HR 123 | Wt 210.0 lb

## 2018-10-15 DIAGNOSIS — O0990 Supervision of high risk pregnancy, unspecified, unspecified trimester: Secondary | ICD-10-CM

## 2018-10-15 DIAGNOSIS — Z3403 Encounter for supervision of normal first pregnancy, third trimester: Secondary | ICD-10-CM

## 2018-10-15 DIAGNOSIS — Z331 Pregnant state, incidental: Secondary | ICD-10-CM

## 2018-10-15 DIAGNOSIS — Z3A36 36 weeks gestation of pregnancy: Secondary | ICD-10-CM

## 2018-10-15 DIAGNOSIS — Z1389 Encounter for screening for other disorder: Secondary | ICD-10-CM

## 2018-10-15 DIAGNOSIS — O0993 Supervision of high risk pregnancy, unspecified, third trimester: Secondary | ICD-10-CM

## 2018-10-15 LAB — POCT URINALYSIS DIPSTICK OB
Blood, UA: NEGATIVE
Glucose, UA: NEGATIVE
Ketones, UA: NEGATIVE
Leukocytes, UA: NEGATIVE
Nitrite, UA: NEGATIVE
POC,PROTEIN,UA: NEGATIVE

## 2018-10-15 NOTE — Progress Notes (Signed)
  G1P0000 [redacted]w[redacted]d Estimated Date of Delivery: 11/08/18  Blood pressure 134/86, pulse (!) 123, weight 210 lb (95.3 kg), last menstrual period 02/01/2018.   BP weight and urine results all reviewed and noted.  Please refer to the obstetrical flow sheet for the fundal height and fetal heart rate documentation:  Patient reports good fetal movement, denies any bleeding and no rupture of membranes symptoms or regular contractions. Patient is without complaints. All questions were answered.   Physical Assessment:   Vitals:   10/15/18 1210  BP: 134/86  Pulse: (!) 123  Weight: 210 lb (95.3 kg)  Body mass index is 38.41 kg/m.        Physical Examination:   General appearance: Well appearing, and in no distress  Mental status: Alert, oriented to person, place, and time  Skin: Warm & dry  Cardiovascular: Normal heart rate noted  Respiratory: Normal respiratory effort, no distress  Abdomen: Soft, gravid, nontender  Pelvic: Cervical exam performed  Dilation: Closed Effacement (%): Thick Station: -2  Extremities: Edema: None  Fetal Status: Fetal Heart Rate (bpm): 159 Fundal Height: 35 cm Movement: Present Presentation: Vertex  Results for orders placed or performed in visit on 10/15/18 (from the past 24 hour(s))  POC Urinalysis Dipstick OB   Collection Time: 10/15/18 12:13 PM  Result Value Ref Range   Color, UA     Clarity, UA     Glucose, UA Negative Negative   Bilirubin, UA     Ketones, UA neg    Spec Grav, UA     Blood, UA neg    pH, UA     POC,PROTEIN,UA Negative Negative, Trace, Small (1+), Moderate (2+), Large (3+), 4+   Urobilinogen, UA     Nitrite, UA neg    Leukocytes, UA Negative Negative   Appearance     Odor       Orders Placed This Encounter  Procedures  . Culture, beta strep (group b only)  . GC/Chlamydia Probe Amp  . POC Urinalysis Dipstick OB    Plan:  Continued routine obstetrical care, take BP at home daily.  Alert [place] if >140/90.   Return in about 1 week  (around 10/22/2018) for web x (give her app). Marland Kitchen

## 2018-10-15 NOTE — Patient Instructions (Signed)
AM I IN LABOR? What is labor? Labor is the work that your body does to birth your baby. Your uterus (the womb) contracts. Your cervix (the mouth of the uterus) opens. You will push your baby out into the world.  What do contractions (labor pains) feel like? When they first start, contractions usually feel like cramps during your period. Sometimes you feel pain in your back. Most often, contractions feel like muscles pulling painfully in your lower belly. At first, the contractions will probably be 15 to 20 minutes apart. They will not feel too painful. As labor goes on, the contractions get stronger, closer together, and more painful.  How do I time the contractions? Time your contractions by counting the number of minutes from the start of one contraction to the start of the next contraction.  What should I do when the contractions start? If it is night and you can sleep, sleep. If it happens during the day, here are some things you can do to take care of yourself at home: ? Walk. If the pains you are having are real labor, walking will make the contractions come faster and harder. If the contractions are not going to continue and be real labor, walking will make the contractions slow down. ? Take a shower or bath. This will help you relax. ? Eat. Labor is a big event. It takes a lot of energy. ? Drink water. Not drinking enough water can cause false labor (contractions that hurt but do not open your cervix). If this is true labor, drinking water will help you have strength to get through your labor. ? Take a nap. Get all the rest you can. ? Get a massage. If your labor is in your back, a strong massage on your lower back may feel very good. Getting a foot massage is always good. ? Don't panic. You can do this. Your body was made for this. You are strong!  When should I go to the hospital or call my health care provider? ? Your contractions have been 5 minutes apart or less for at least 1  hour. ? If several contractions are so painful you cannot walk or talk during one. ? Your bag of waters breaks. (You may have a big gush of water or just water that runs down your legs when you walk.)  Are there other reasons to call my health care provider? Yes, you should call your health care provider or go to the hospital if you start to bleed like you are having a period- blood that soaks your underwear or runs down your legs, if you have sudden severe pain, if your baby has not moved for several hours, or if you are leaking Stotler fluid. The rule is as follows: If you are very concerned about something, call.    Cervical Ripening: May try one or both  Red Raspberry Leaf capsules:  two 300mg or 400mg tablets with each meal, 2-3 times a day  Potential Side Effects Of Raspberry Leaf:  Most women do not experience any side effects from drinking raspberry leaf tea. However, nausea and loose stools are possible     Evening Primrose Oil capsules: may take 1 to 3 capsules daily. May also prick one to release the oil and insert it into your vagina at night.  Some of the potential side effects:  Upset stomach  Loose stools or diarrhea  Headaches  Nausea:   ``````````````````````````````` 

## 2018-10-17 LAB — GC/CHLAMYDIA PROBE AMP
Chlamydia trachomatis, NAA: NEGATIVE
Neisseria Gonorrhoeae by PCR: NEGATIVE

## 2018-10-19 ENCOUNTER — Encounter: Payer: Self-pay | Admitting: *Deleted

## 2018-10-19 LAB — CULTURE, BETA STREP (GROUP B ONLY): Strep Gp B Culture: NEGATIVE

## 2018-10-20 ENCOUNTER — Encounter: Payer: BLUE CROSS/BLUE SHIELD | Admitting: Obstetrics & Gynecology

## 2018-10-21 ENCOUNTER — Telehealth: Payer: Self-pay | Admitting: *Deleted

## 2018-10-21 NOTE — Telephone Encounter (Signed)
Pt aware of webex visit tomorrow.

## 2018-10-22 ENCOUNTER — Other Ambulatory Visit: Payer: Self-pay

## 2018-10-22 ENCOUNTER — Ambulatory Visit (INDEPENDENT_AMBULATORY_CARE_PROVIDER_SITE_OTHER): Payer: BLUE CROSS/BLUE SHIELD | Admitting: Advanced Practice Midwife

## 2018-10-22 ENCOUNTER — Encounter: Payer: Self-pay | Admitting: Advanced Practice Midwife

## 2018-10-22 VITALS — BP 116/72 | HR 103 | Wt 210.0 lb

## 2018-10-22 DIAGNOSIS — Z3A37 37 weeks gestation of pregnancy: Secondary | ICD-10-CM

## 2018-10-22 DIAGNOSIS — Z3403 Encounter for supervision of normal first pregnancy, third trimester: Secondary | ICD-10-CM

## 2018-10-22 NOTE — Progress Notes (Signed)
   TELEHEALTH VIRTUAL OBSTETRICS VISIT ENCOUNTER NOTE  I connected with Maria Brooks on 10/22/18 at  1:30 PM EDT by telephone at home and verified that I am speaking with the correct person using two identifiers.   I discussed the limitations, risks, security and privacy concerns of performing an evaluation and management service by telephone and the availability of in person appointments. I also discussed with the patient that there may be a patient responsible charge related to this service. The patient expressed understanding and agreed to proceed.  Subjective:  Maria Brooks is a 28 y.o. G1P0000 at [redacted]w[redacted]d being followed for ongoing prenatal care.  She is currently monitored for the following issues for this low-risk pregnancy and has Obesity (BMI 35.0-39.9 without comorbidity); Cerebral venous sinus thrombosis; and Supervision of normal pregnancy on their problem list.  Patient reports no complaints. Reports fetal movement. Denies any contractions, bleeding or leaking of fluid.   The following portions of the patient's history were reviewed and updated as appropriate: allergies, current medications, past family history, past medical history, past social history, past surgical history and problem list.    Given info on cx ripening last week.  Hasn't tried it yet .   Objective:   General:  Alert, oriented and cooperative.   Mental Status: Normal mood and affect perceived. Normal judgment and thought content.  Rest of physical exam deferred due to type of encounter  Assessment and Plan:  Pregnancy: G1P0000 at [redacted]w[redacted]d There are no diagnoses linked to this encounter. Term labor symptoms and general obstetric precautions including but not limited to vaginal bleeding, contractions, leaking of fluid and fetal movement were reviewed in detail with the patient.  I discussed the assessment and treatment plan with the patient. The patient was provided an opportunity to ask questions and all were  answered. The patient agreed with the plan and demonstrated an understanding of the instructions. The patient was advised to call back or seek an in-person office evaluation/go to [facility] at [facility] for any urgent or concerning symptoms. Please refer to After Visit Summary for other counseling recommendations.   I provided 13 minutes of non-face-to-face time during this encounter.  Return in about 1 week (around 10/29/2018) for web ex visit.  Future Appointments  Date Time Provider Department Center  10/29/2018  1:30 PM Cheral Marker, CNM [facility]    Jacklyn Shell, CNM Center for [employer], [facility]

## 2018-10-29 ENCOUNTER — Encounter: Payer: Self-pay | Admitting: Women's Health

## 2018-10-29 ENCOUNTER — Ambulatory Visit (INDEPENDENT_AMBULATORY_CARE_PROVIDER_SITE_OTHER): Payer: BLUE CROSS/BLUE SHIELD | Admitting: Women's Health

## 2018-10-29 ENCOUNTER — Other Ambulatory Visit: Payer: Self-pay

## 2018-10-29 VITALS — BP 114/80 | HR 121 | Wt 211.0 lb

## 2018-10-29 DIAGNOSIS — O480 Post-term pregnancy: Secondary | ICD-10-CM

## 2018-10-29 DIAGNOSIS — Z3A38 38 weeks gestation of pregnancy: Secondary | ICD-10-CM

## 2018-10-29 DIAGNOSIS — Z3403 Encounter for supervision of normal first pregnancy, third trimester: Secondary | ICD-10-CM

## 2018-10-29 NOTE — Patient Instructions (Signed)
Maria BlackwaterAlison A Brooks, I greatly value your feedback.  If you receive a survey following your visit with us today, we appreciate you taking the time to fill it out.  Thanks, Joellyn HaffKim Jamese Trauger, CNM, [facility]  [facility] M[facility] It is now [facility] at [facility] ([address] [place], [place] [address]) Entrance located off of [address] Free 24/7 valet parking    Call the office [phone]) or go to [facility] if:  You begin to have strong, frequent contractions  Your water breaks.  Sometimes it is a big gush of fluid, sometimes it is just a trickle that keeps getting your panties wet or running down your legs  You have vaginal bleeding.  It is normal to have a small amount of spotting if your cervix was checked.   You don't feel your baby moving like normal.  If you don't, get you something to eat and drink and lay down and focus on feeling your baby move.  You should feel at least 10 movements in 2 hours.  If you don't, you should call the office or go to [facility].    Call the office [phone]) or go to [facility] for these signs of pre-eclampsia:  Severe headache that does not go away with Tylenol  Visual changes- seeing spots, double, blurred vision  Pain under your right breast or upper abdomen that does not go away with Tums or heartburn medicine  Nausea and/or vomiting  Severe swelling in your hands, feet, and face    Home Blood Pressure Monitoring for Patients   Your provider has recommended that you check your blood pressure (BP) at least once a week at home. If you do not have a blood pressure cuff at home, one will be provided for you. Contact your provider if you have not received your monitor within 1 week.   Helpful Tips for Accurate Home Blood Pressure Checks   Don't smoke, exercise, or drink caffeine 30 minutes before checking your BP  Use the restroom before checking your BP (a full bladder can raise your pressure)  Relax in  a comfortable upright chair  Feet on the ground  Left arm resting comfortably on a flat surface at the level of your heart  Legs uncrossed  Back supported  Sit quietly and don't talk  Place the cuff on your bare arm  Adjust snuggly, so that only two fingertips can fit between your skin and the top of the cuff  Check 2 readings separated by at least one minute  Keep a log of your BP readings  For a visual, please reference this diagram: http://ccnc.care/bpdiagram  Provider Name: [facility]     Phone: [phone]  Zone 1: ALL CLEAR  Continue to monitor your symptoms:   BP reading is less than 140 (top number) or less than 90 (bottom number)   No right upper stomach pain  No headaches or seeing spots  No feeling nauseated or throwing up  No swelling in face and hands  Zone 2: CAUTION Call your doctor's office for any of the following:   BP reading is greater than 140 (top number) or greater than 90 (bottom number)   Stomach pain under your ribs in the middle or right side  Headaches or seeing spots  Feeling nauseated or throwing up  Swelling in face and hands  Zone 3: EMERGENCY  Seek immediate medical care if you have any of the following:   BP  reading is greater than160 (top number) or greater than 110 (bottom number)  Severe headaches not improving with Tylenol  Serious difficulty catching your breath  Any worsening symptoms from Zone 2    Braxton Hicks Contractions Contractions of the uterus can occur throughout pregnancy, but they are not always a sign that you are in labor. You may have practice contractions called Braxton Hicks contractions. These false labor contractions are sometimes confused with true labor. What are Deberah Pelton contractions? Braxton Hicks contractions are tightening movements that occur in the muscles of the uterus before labor. Unlike true labor contractions, these contractions do not result in opening (dilation) and  thinning of the cervix. Toward the end of pregnancy (32-34 weeks), Braxton Hicks contractions can happen more often and may become stronger. These contractions are sometimes difficult to tell apart from true labor because they can be very uncomfortable. You should not feel embarrassed if you go to the hospital with false labor. Sometimes, the only way to tell if you are in true labor is for your health care provider to look for changes in the cervix. The health care provider will do a physical exam and may monitor your contractions. If you are not in true labor, the exam should show that your cervix is not dilating and your water has not broken. If there are no other health problems associated with your pregnancy, it is completely safe for you to be sent home with false labor. You may continue to have Braxton Hicks contractions until you go into true labor. How to tell the difference between true labor and false labor True labor  Contractions last 30-70 seconds.  Contractions become very regular.  Discomfort is usually felt in the top of the uterus, and it spreads to the lower abdomen and low back.  Contractions do not go away with walking.  Contractions usually become more intense and increase in frequency.  The cervix dilates and gets thinner. False labor  Contractions are usually shorter and not as strong as true labor contractions.  Contractions are usually irregular.  Contractions are often felt in the front of the lower abdomen and in the groin.  Contractions may go away when you walk around or change positions while lying down.  Contractions get weaker and are shorter-lasting as time goes on.  The cervix usually does not dilate or become thin. Follow these instructions at home:   Take over-the-counter and prescription medicines only as told by your health care provider.  Keep up with your usual exercises and follow other instructions from your health care provider.  Eat  and drink lightly if you think you are going into labor.  If Braxton Hicks contractions are making you uncomfortable: ? Change your position from lying down or resting to walking, or change from walking to resting. ? Sit and rest in a tub of warm water. ? Drink enough fluid to keep your urine pale yellow. Dehydration may cause these contractions. ? Do slow and deep breathing several times an hour.  Keep all follow-up prenatal visits as told by your health care provider. This is important. Contact a health care provider if:  You have a fever.  You have continuous pain in your abdomen. Get help right away if:  Your contractions become stronger, more regular, and closer together.  You have fluid leaking or gushing from your vagina.  You pass blood-tinged mucus (bloody show).  You have bleeding from your vagina.  You have low back pain that you never  had before.  You feel your babys head pushing down and causing pelvic pressure.  Your baby is not moving inside you as much as it used to. Summary  Contractions that occur before labor are called Braxton Hicks contractions, false labor, or practice contractions.  Braxton Hicks contractions are usually shorter, weaker, farther apart, and less regular than true labor contractions. True labor contractions usually become progressively stronger and regular, and they become more frequent.  Manage discomfort from [facility] contractions by changing position, resting in a warm bath, drinking plenty of water, or practicing deep breathing. This information is not intended to replace advice given to you by your health care provider. Make sure you discuss any questions you have with your health care provider. Document Released: 11/14/2016 Document Revised: 04/15/2017 Document Reviewed: 11/14/2016 Elsevier Interactive Patient Education  2019 [employer].

## 2018-10-29 NOTE — Progress Notes (Signed)
   TELEHEALTH VIRTUAL OBSTETRICS VISIT ENCOUNTER NOTE Patient name: Maria Brooks MRN [id number]  Date of birth: 1990/11/06  I connected with patient on 10/29/18 at  1:30 PM EDT by [facility] and verified that I am speaking with the correct person using two identifiers. Due to COVID-19 recommendations, pt is not currently in our office.    I discussed the limitations, risks, security and privacy concerns of performing an evaluation and management service by telephone and the availability of in person appointments. I also discussed with the patient that there may be a patient responsible charge related to this service. The patient expressed understanding and agreed to proceed.  Chief Complaint:   Routine Prenatal Visit  History of Present Illness:   Maria Brooks is a 28 y.o. G1P0000 female at [redacted]w[redacted]d with an Estimated Date of Delivery: 11/08/18 being evaluated today for ongoing management of a low-risk pregnancy.  Today she reports no complaints. Contractions: Not present.  .  Movement: Present. denies leaking of fluid. Review of Systems:   Pertinent items are noted in HPI Denies abnormal vaginal discharge w/ itching/odor/irritation, headaches, visual changes, shortness of breath, chest pain, abdominal pain, severe nausea/vomiting, or problems with urination or bowel movements unless otherwise stated above. Pertinent History Reviewed:  Reviewed past medical,surgical, social, obstetrical and family history.  Reviewed problem list, medications and allergies. Physical Assessment:   Vitals:   10/29/18 1336  BP: 114/80  Pulse: (!) 121  Weight: 211 lb (95.7 kg)  Body mass index is 38.59 kg/m.        Physical Examination:   General:  Alert, oriented and cooperative.   Mental Status: Normal mood and affect perceived. Normal judgment and thought content.  Rest of physical exam deferred due to type of encounter  No results found for this or any previous visit (from the past 24 hour(s)).  Assessment  & Plan:  1) Pregnancy G1P0000 at [redacted]w[redacted]d with an Estimated Date of Delivery: 11/08/18   2) H/O CVS thrombosis, On Lovenox 80mg  daily   Meds: No orders of the defined types were placed in this encounter.   Labs/procedures today: none  Plan:  Continue routine obstetrical care . Has home BP cuff. Check bp weekly, let us know if >140/90.   Reviewed: Term labor symptoms and general obstetric precautions including but not limited to vaginal bleeding, contractions, leaking of fluid and fetal movement were reviewed in detail with the patient. The patient was advised to call back or seek an in-person office evaluation/go to [facility] at [facility] for any urgent or concerning symptoms. All questions were answered. Please refer to After Visit Summary for other counseling recommendations.   I provided 10 minutes of face-to-face time during this encounter.  Follow-up: Return for 4/22 webex, 2/27 LROB w/ bpp u/s.  Orders Placed This Encounter  Procedures  . US FETAL BPP WO NON STRESS   Cheral Marker CNM, [facility] 10/29/2018 1:55 PM

## 2018-11-05 ENCOUNTER — Ambulatory Visit (INDEPENDENT_AMBULATORY_CARE_PROVIDER_SITE_OTHER): Payer: BLUE CROSS/BLUE SHIELD | Admitting: Obstetrics & Gynecology

## 2018-11-05 ENCOUNTER — Other Ambulatory Visit: Payer: Self-pay

## 2018-11-05 VITALS — BP 111/77 | HR 102 | Wt 211.0 lb

## 2018-11-05 DIAGNOSIS — G08 Intracranial and intraspinal phlebitis and thrombophlebitis: Secondary | ICD-10-CM

## 2018-11-05 DIAGNOSIS — Z3403 Encounter for supervision of normal first pregnancy, third trimester: Secondary | ICD-10-CM

## 2018-11-05 DIAGNOSIS — Z3A39 39 weeks gestation of pregnancy: Secondary | ICD-10-CM

## 2018-11-05 NOTE — Progress Notes (Signed)
   WebExVIRTUAL OBSTETRICS VISIT ENCOUNTER NOTE  I connected with Maria Brooks on 11/05/18 at  1:45 PM EDT by Webex at home and verified that I am speaking with the correct person using two identifiers.   I discussed the limitations, risks, security and privacy concerns of performing an evaluation and management service by telephone and the availability of in person appointments. I also discussed with the patient that there may be a patient responsible charge related to this service. The patient expressed understanding and agreed to proceed.  Subjective:  Maria Brooks is a 28 y.o. G1P0000 at [redacted]w[redacted]d being followed for ongoing prenatal care.  She is currently monitored for the following issues for this low-risk pregnancy and has Obesity (BMI 35.0-39.9 without comorbidity); Cerebral venous sinus thrombosis; and Supervision of normal pregnancy on their problem list.  Patient reports no complaints. Reports fetal movement. Denies any contractions, bleeding or leaking of fluid.   The following portions of the patient's history were reviewed and updated as appropriate: allergies, current medications, past family history, past medical history, past social history, past surgical history and problem list.   Objective:   General:  Alert, oriented and cooperative.   Mental Status: Normal mood and affect perceived. Normal judgment and thought content.  Rest of physical exam deferred due to type of encounter BP 111/77 at home Assessment and Plan:  Pregnancy: G1P0000 at [redacted]w[redacted]d There are no diagnoses linked to this encounter. Term labor symptoms and general obstetric precautions including but not limited to vaginal bleeding, contractions, leaking of fluid and fetal movement were reviewed in detail with the patient.  I discussed the assessment and treatment plan with the patient. The patient was provided an opportunity to ask questions and all were answered. The patient agreed with the plan and demonstrated  an understanding of the instructions. The patient was advised to call back or seek an in-person office evaluation/go to MAU at [facility] for any urgent or concerning symptoms. Please refer to After Visit Summary for other counseling recommendations.    Stay on lovenox 80 mg until directed otherwise I provided 11 minutes of webex time during this encounter.  Return for keep scheduled visit +sonogram.  Future Appointments  Date Time Provider Department Center  11/10/2018  3:00 PM [facility] [place] [facility] None  11/10/2018  3:45 PM Jasmond River, Amaryllis Dyke, MD [facility]    Lazaro Arms, MD Center for [facility]

## 2018-11-08 ENCOUNTER — Other Ambulatory Visit: Payer: Self-pay

## 2018-11-08 ENCOUNTER — Inpatient Hospital Stay (HOSPITAL_COMMUNITY): Payer: BLUE CROSS/BLUE SHIELD | Admitting: Anesthesiology

## 2018-11-08 ENCOUNTER — Inpatient Hospital Stay (HOSPITAL_COMMUNITY)
Admission: AD | Admit: 2018-11-08 | Discharge: 2018-11-11 | DRG: 805 | Disposition: A | Discharge status: Home / Self Care | Payer: BLUE CROSS/BLUE SHIELD | Attending: Obstetrics and Gynecology | Admitting: Obstetrics and Gynecology

## 2018-11-08 ENCOUNTER — Encounter (HOSPITAL_COMMUNITY): Payer: Self-pay

## 2018-11-08 DIAGNOSIS — Z3A40 40 weeks gestation of pregnancy: Secondary | ICD-10-CM | POA: Diagnosis not present

## 2018-11-08 DIAGNOSIS — O4202 Full-term premature rupture of membranes, onset of labor within 24 hours of rupture: Secondary | ICD-10-CM

## 2018-11-08 DIAGNOSIS — O26893 Other specified pregnancy related conditions, third trimester: Secondary | ICD-10-CM | POA: Diagnosis present

## 2018-11-08 DIAGNOSIS — O411230 Chorioamnionitis, third trimester, not applicable or unspecified: Secondary | ICD-10-CM | POA: Diagnosis present

## 2018-11-08 DIAGNOSIS — O873 Cerebral venous thrombosis in the puerperium: Secondary | ICD-10-CM | POA: Diagnosis present

## 2018-11-08 DIAGNOSIS — O4290 Premature rupture of membranes, unspecified as to length of time between rupture and onset of labor, unspecified weeks of gestation: Secondary | ICD-10-CM | POA: Diagnosis present

## 2018-11-08 DIAGNOSIS — Z3403 Encounter for supervision of normal first pregnancy, third trimester: Secondary | ICD-10-CM

## 2018-11-08 DIAGNOSIS — O411290 Chorioamnionitis, unspecified trimester, not applicable or unspecified: Secondary | ICD-10-CM | POA: Clinically undetermined

## 2018-11-08 DIAGNOSIS — R000 Tachycardia, unspecified: Secondary | ICD-10-CM | POA: Diagnosis present

## 2018-11-08 DIAGNOSIS — O4292 Full-term premature rupture of membranes, unspecified as to length of time between rupture and onset of labor: Principal | ICD-10-CM | POA: Diagnosis present

## 2018-11-08 DIAGNOSIS — O99214 Obesity complicating childbirth: Secondary | ICD-10-CM | POA: Diagnosis present

## 2018-11-08 DIAGNOSIS — E669 Obesity, unspecified: Secondary | ICD-10-CM | POA: Diagnosis present

## 2018-11-08 LAB — CBC WITH DIFFERENTIAL/PLATELET
Abs Immature Granulocytes: 0.07 10*3/uL (ref 0.00–0.07)
Basophils Absolute: 0 10*3/uL (ref 0.0–0.1)
Basophils Relative: 0 %
Eosinophils Absolute: 0 10*3/uL (ref 0.0–0.5)
Eosinophils Relative: 0 %
HCT: 30.1 % — ABNORMAL LOW (ref 36.0–46.0)
Hemoglobin: 8.9 g/dL — ABNORMAL LOW (ref 12.0–15.0)
Immature Granulocytes: 0 %
Lymphocytes Relative: 7 %
Lymphs Abs: 1.1 10*3/uL (ref 0.7–4.0)
MCH: 22.3 pg — ABNORMAL LOW (ref 26.0–34.0)
MCHC: 29.6 g/dL — ABNORMAL LOW (ref 30.0–36.0)
MCV: 75.4 fL — ABNORMAL LOW (ref 80.0–100.0)
Monocytes Absolute: 1 10*3/uL (ref 0.1–1.0)
Monocytes Relative: 6 %
Neutro Abs: 13.5 10*3/uL — ABNORMAL HIGH (ref 1.7–7.7)
Neutrophils Relative %: 87 %
Platelets: 166 10*3/uL (ref 150–400)
RBC: 3.99 MIL/uL (ref 3.87–5.11)
RDW: 15.9 % — ABNORMAL HIGH (ref 11.5–15.5)
WBC: 15.6 10*3/uL — ABNORMAL HIGH (ref 4.0–10.5)
nRBC: 0 % (ref 0.0–0.2)

## 2018-11-08 LAB — COMPREHENSIVE METABOLIC PANEL
ALT: 12 U/L (ref 0–44)
AST: 21 U/L (ref 15–41)
Albumin: 2.8 g/dL — ABNORMAL LOW (ref 3.5–5.0)
Alkaline Phosphatase: 190 U/L — ABNORMAL HIGH (ref 38–126)
Anion gap: 12 (ref 5–15)
BUN: 7 mg/dL (ref 6–20)
CO2: 16 mmol/L — ABNORMAL LOW (ref 22–32)
Calcium: 9.1 mg/dL (ref 8.9–10.3)
Chloride: 107 mmol/L (ref 98–111)
Creatinine, Ser: 0.68 mg/dL (ref 0.44–1.00)
GFR calc Af Amer: >60 mL/min (ref >60)
GFR calc non Af Amer: >60 mL/min (ref >60)
Glucose, Bld: 99 mg/dL (ref 70–99)
Potassium: 4.1 mmol/L (ref 3.5–5.1)
Sodium: 135 mmol/L (ref 135–145)
Total Bilirubin: 0.5 mg/dL (ref 0.3–1.2)
Total Protein: 6.6 g/dL (ref 6.5–8.1)

## 2018-11-08 LAB — TYPE AND SCREEN
ABO/RH(D): AB POS
Antibody Screen: NEGATIVE

## 2018-11-08 LAB — CBC
HCT: 35 % — ABNORMAL LOW (ref 36.0–46.0)
Hemoglobin: 10.6 g/dL — ABNORMAL LOW (ref 12.0–15.0)
MCH: 22.7 pg — ABNORMAL LOW (ref 26.0–34.0)
MCHC: 30.3 g/dL (ref 30.0–36.0)
MCV: 74.9 fL — ABNORMAL LOW (ref 80.0–100.0)
Platelets: 194 10*3/uL (ref 150–400)
RBC: 4.67 MIL/uL (ref 3.87–5.11)
RDW: 15.9 % — ABNORMAL HIGH (ref 11.5–15.5)
WBC: 10.5 10*3/uL (ref 4.0–10.5)
nRBC: 0 % (ref 0.0–0.2)

## 2018-11-08 LAB — POCT FERN TEST: POCT Fern Test: POSITIVE

## 2018-11-08 MED ORDER — LACTATED RINGERS IV SOLN
500.0000 mL | Freq: Once | INTRAVENOUS | Status: AC
  Administered 2018-11-08: 15:00:00 500 mL via INTRAVENOUS

## 2018-11-08 MED ORDER — DIPHENHYDRAMINE HCL 50 MG/ML IJ SOLN
12.5000 mg | INTRAMUSCULAR | Status: DC | PRN
  Administered 2018-11-09: 01:00:00 12.5 mg via INTRAVENOUS
  Filled 2018-11-08: qty 1

## 2018-11-08 MED ORDER — PHENYLEPHRINE 40 MCG/ML (10ML) SYRINGE FOR IV PUSH (FOR BLOOD PRESSURE SUPPORT): 80.0000 ug | PREFILLED_SYRINGE | INTRAVENOUS | Status: DC | PRN

## 2018-11-08 MED ORDER — LACTATED RINGERS IV SOLN
INTRAVENOUS | Status: DC
  Administered 2018-11-08 – 2018-11-09 (×4): via INTRAVENOUS

## 2018-11-08 MED ORDER — SODIUM CHLORIDE (PF) 0.9 % IJ SOLN
INTRAMUSCULAR | Status: DC | PRN
  Administered 2018-11-08: 14 mL/h via EPIDURAL

## 2018-11-08 MED ORDER — SOD CITRATE-CITRIC ACID 500-334 MG/5ML PO SOLN: 30.0000 mL | ORAL | Status: DC | PRN

## 2018-11-08 MED ORDER — LACTATED RINGERS IV BOLUS
1000.0000 mL | Freq: Once | INTRAVENOUS | Status: AC
  Administered 2018-11-08: 1000 mL via INTRAVENOUS

## 2018-11-08 MED ORDER — EPHEDRINE 5 MG/ML INJ: 10.0000 mg | INTRAVENOUS | Status: DC | PRN

## 2018-11-08 MED ORDER — SODIUM CHLORIDE 0.9 % IV SOLN
2.0000 g | Freq: Four times a day (QID) | INTRAVENOUS | Status: DC
  Administered 2018-11-09 (×2): 2 g via INTRAVENOUS
  Filled 2018-11-08 (×6): qty 2000

## 2018-11-08 MED ORDER — ONDANSETRON HCL 4 MG/2ML IJ SOLN
4.0000 mg | Freq: Four times a day (QID) | INTRAMUSCULAR | Status: DC | PRN
  Administered 2018-11-08: 12:00:00 4 mg via INTRAVENOUS
  Filled 2018-11-08: qty 2

## 2018-11-08 MED ORDER — ACETAMINOPHEN 500 MG PO TABS
1000.0000 mg | ORAL_TABLET | Freq: Once | ORAL | Status: AC
  Administered 2018-11-08: 23:00:00 1000 mg via ORAL
  Filled 2018-11-08: qty 2

## 2018-11-08 MED ORDER — OXYCODONE-ACETAMINOPHEN 5-325 MG PO TABS: 2.0000 | ORAL_TABLET | ORAL | Status: DC | PRN

## 2018-11-08 MED ORDER — LIDOCAINE HCL (PF) 1 % IJ SOLN
30.0000 mL | INTRAMUSCULAR | Status: AC | PRN
  Administered 2018-11-09: 30 mL via SUBCUTANEOUS
  Filled 2018-11-08: qty 30

## 2018-11-08 MED ORDER — PHENYLEPHRINE 40 MCG/ML (10ML) SYRINGE FOR IV PUSH (FOR BLOOD PRESSURE SUPPORT)
PREFILLED_SYRINGE | INTRAVENOUS | Status: AC
  Filled 2018-11-08: qty 10

## 2018-11-08 MED ORDER — FENTANYL-BUPIVACAINE-NACL 0.5-0.125-0.9 MG/250ML-% EP SOLN
12.0000 mL/h | EPIDURAL | Status: DC | PRN
  Administered 2018-11-09: 12 mL/h via EPIDURAL
  Filled 2018-11-08 (×2): qty 250

## 2018-11-08 MED ORDER — OXYTOCIN 40 UNITS IN NORMAL SALINE INFUSION - SIMPLE MED: 2.5000 [IU]/h | INTRAVENOUS | Status: DC

## 2018-11-08 MED ORDER — LIDOCAINE HCL (PF) 1 % IJ SOLN
INTRAMUSCULAR | Status: DC | PRN
  Administered 2018-11-08: 11 mL via EPIDURAL

## 2018-11-08 MED ORDER — OXYTOCIN BOLUS FROM INFUSION
500.0000 mL | Freq: Once | INTRAVENOUS | Status: AC
  Administered 2018-11-09: 500 mL via INTRAVENOUS

## 2018-11-08 MED ORDER — FENTANYL CITRATE (PF) 100 MCG/2ML IJ SOLN
100.0000 ug | INTRAMUSCULAR | Status: DC | PRN
  Administered 2018-11-08 (×2): 100 ug via INTRAVENOUS
  Filled 2018-11-08 (×2): qty 2

## 2018-11-08 MED ORDER — ACETAMINOPHEN 325 MG PO TABS: 650.0000 mg | ORAL_TABLET | ORAL | Status: DC | PRN

## 2018-11-08 MED ORDER — FENTANYL-BUPIVACAINE-NACL 0.5-0.125-0.9 MG/250ML-% EP SOLN
EPIDURAL | Status: AC
  Filled 2018-11-08: qty 250

## 2018-11-08 MED ORDER — LACTATED RINGERS IV SOLN
500.0000 mL | INTRAVENOUS | Status: DC | PRN
  Administered 2018-11-08 (×2): 500 mL via INTRAVENOUS

## 2018-11-08 MED ORDER — OXYCODONE-ACETAMINOPHEN 5-325 MG PO TABS: 1.0000 | ORAL_TABLET | ORAL | Status: DC | PRN

## 2018-11-08 MED ORDER — GENTAMICIN SULFATE 40 MG/ML IJ SOLN
5.0000 mg/kg | Freq: Once | INTRAVENOUS | Status: AC
  Administered 2018-11-09: 490 mg via INTRAVENOUS
  Filled 2018-11-08: qty 12.25

## 2018-11-08 NOTE — Anesthesia Preprocedure Evaluation (Signed)
Anesthesia Evaluation  Patient identified by MRN, date of birth, ID band Patient awake    Reviewed: Allergy & Precautions, NPO status , Patient's Chart, lab work & pertinent test results  Airway Mallampati: II  TM Distance: >3 FB Neck ROM: Full    Dental no notable dental hx.    Pulmonary neg pulmonary ROS,    Pulmonary exam normal breath sounds clear to auscultation       Cardiovascular negative cardio ROS Normal cardiovascular exam Rhythm:Regular Rate:Normal     Neuro/Psych negative neurological ROS  negative psych ROS   GI/Hepatic Neg liver ROS, GERD  ,  Endo/Other  negative endocrine ROS  Renal/GU negative Renal ROS  negative genitourinary   Musculoskeletal negative musculoskeletal ROS (+)   Abdominal (+) + obese,   Peds negative pediatric ROS (+)  Hematology negative hematology ROS (+)   Anesthesia Other Findings   Reproductive/Obstetrics (+) Pregnancy                             Anesthesia Physical Anesthesia Plan  ASA: II  Anesthesia Plan: Epidural   Post-op Pain Management:    Induction:   PONV Risk Score and Plan:   Airway Management Planned:   Additional Equipment:   Intra-op Plan:   Post-operative Plan:   Informed Consent:   Plan Discussed with:   Anesthesia Plan Comments:         Anesthesia Quick Evaluation

## 2018-11-08 NOTE — Anesthesia Procedure Notes (Signed)
Epidural Patient location during procedure: OB Start time: 11/08/2018 2:56 PM End time: 11/08/2018 3:09 PM  Staffing Anesthesiologist: Lowella Curb, MD Performed: anesthesiologist   Preanesthetic Checklist Completed: patient identified, site marked, surgical consent, pre-op evaluation, timeout performed, IV checked, risks and benefits discussed and monitors and equipment checked  Epidural Patient position: sitting Prep: ChloraPrep Patient monitoring: heart rate, cardiac monitor, continuous pulse ox and blood pressure Approach: midline Location: L2-L3 Injection technique: LOR saline  Needle:  Needle type: Tuohy  Needle gauge: 17 G Needle length: 9 cm Needle insertion depth: 7 cm Catheter type: closed end flexible Catheter size: 20 Guage Catheter at skin depth: 11 cm Test dose: negative  Assessment Events: blood not aspirated, injection not painful, no injection resistance, negative IV test and no paresthesia  Additional Notes Reason for block:procedure for pain

## 2018-11-08 NOTE — Progress Notes (Addendum)
Patient ID: Maria Brooks, female   DOB: 1990-08-27, 28 y.o.   MRN: [id number]  Labor Progress Note Maria Brooks is a 28 y.o. G1P0000 at [redacted]w[redacted]d presented for  PROM/early SOL  S: Lying on side with peanut. Comfortable.   O:  BP (!) 110/46   Pulse (!) 116   Temp 98.3 F (36.8 C) (Oral)   Resp 16   Ht 5\' 2"  (1.575 m)   Wt 97.5 kg   LMP 02/01/2018   SpO2 100%   BMI 39.32 kg/m  EFM: 125/mod/+accels, no decels  CVE: Dilation: 5.5 Effacement (%): 80 Cervical Position: Posterior Station: -2 Presentation: Vertex Exam by:: Gilberto Stanforth   [name]: 28 y.o. G1P0000 [redacted]w[redacted]d presented with PROM and early labor #Labor: Progressing. If labor stalls will plan for augmentation to reduce risk of prolonged ROM.  #Pain: s/p epidural #FWB: Cat 1. ROM 4/26 @1030a . Thick meconium, confirmed Cephalic by BSUS.  #GBS negative  #h/o Central Venous Thrombus: off lovenox currently. Plan to restart at 12h pp.  #UOP- improved since 1L bolus  Federico Flake, MD 7:55 PM

## 2018-11-08 NOTE — Progress Notes (Signed)
Patient ID: Maria Brooks, female   DOB: 1991/05/31, 28 y.o.   MRN: [id number]  .Labor Progress Note Maria Brooks is a 28 y.o. G1P0000 at [redacted]w[redacted]d presented for  PROM/early SOL  S: Patient is s/p epidural and feeling much better. Making jokes with staff and partner.   O:  BP 121/80   Pulse (!) 115   Temp 98.1 F (36.7 C) (Oral)   Resp 14   Ht 5\' 2"  (1.575 m)   Wt 97.5 kg   LMP 02/01/2018   SpO2 100%   BMI 39.32 kg/m  EFM: 120/mod/+accels, no decels  CVE: Dilation: 3.5 Effacement (%): 90 Cervical Position: Posterior Station: -2 Presentation: Vertex Exam by:: Pinchus Weckwerth  Placed FSE   [name]: 28 y.o. G1P0000 [redacted]w[redacted]d presented with PROM and early labor #Labor: Progressing. Will plan to recheck in 6 hours if minimal change, plan for pitocin augmentation to reduce risk of prolonged ROM.  #Pain: s/p epidural #FWB: Cat 1. ROM 4/26 @1030a . Thick meconium, confirmed Cephalic by BSUS on admission given ROM and thick mec. FSE in place due to inability to fully differentiate maternal HR and fetal HR due to high maternal baseline and low-normal fetal baseline.  #GBS negative  #h/o Central Venous Thrombus: off lovenox currently. Plan to restart at 12h pp.   Federico Flake, MD 4:49 PM

## 2018-11-08 NOTE — Progress Notes (Addendum)
Maria Brooks, [id number]  Subjective -Care assumed of 28 y.o. G1P0 at [redacted]w[redacted]d who presents for PROM. Pregnancy history unremarkable, but medical history significant for Central Venous Thrombosis with current lovenox usage. In room to meet acquaintance of patient.  She denies HA, visual disturbances, SOB, edema, or RUQ pain.  She also denies rectal pressure and endorses comfort with epidural in place.     Objective Vitals:   11/08/18 2051 11/08/18 2056  BP:    Pulse:    Resp:    Temp:    SpO2: 100% 100%   I/O last 3 completed shifts: In: -  Out: 200 [Urine:200] Total I/O In: 215.4 [I.V.:215.4] Out: 50 [Urine:50]  Physical Exam Constitutional:      Appearance: Normal appearance.  HENT:     Head: Normocephalic and atraumatic.  Eyes:     Conjunctiva/sclera: Conjunctivae normal.  Neck:     Musculoskeletal: Normal range of motion.  Cardiovascular:     Rate and Rhythm: Regular rhythm. Tachycardia present.     Heart sounds: Normal heart sounds.  Pulmonary:     Effort: Pulmonary effort is normal.     Breath sounds: Normal breath sounds.  Abdominal:     Palpations: Abdomen is soft.     Comments: Gravid--fundal height appears AGA, Soft RT, NT  Musculoskeletal:     Right lower leg: No edema.     Left lower leg: No edema.  Skin:    General: Skin is warm and dry.  Neurological:     Mental Status: She is alert and oriented to person, place, and time.  Psychiatric:        Mood and Affect: Mood normal.        Thought Content: Thought content normal.     IOM:BTDHRCBU: 5.5 Effacement (%): 80 Cervical Position: Posterior Station: -2 Presentation: Vertex Exam by:: Newton at 1845 Membranes:SROM x 11 hrs  Pelvis: -Proven:No -Adequate: Not assessed Leopolds: -Position:Vertex -EFW:7-8lbs  FHR: 125 bpm, Mod Var, + Variable Decels, +Accels UC:  Q1-62min, palpates mild to moderate  Management Plan: Expectant: Yes Augmentation:Discussed   Assessment IUP at 40wks Cat I FT  Overall GBS Negative Maternal Tachycardia  Plan -Reviewed POC in regards to  patient expectations and current labor phase. -Discussed VE at MN with placement of IUPC and initiation of pitocin if cervical change has not been made. -No questions or concerns  -Will give bolus of LR for maternal tachycardia -Continue other mgmt as ordered -Plan to restart Lovenox within 12 hours of delivery and continue until 8 weeks.   Cherre Robins, CNM 11/08/2018, 9:18 PM   Addendum 10:39 PM   Nurse reports patient with low grade-fever and chills.   Suspected Chorioamnionitis  -Give 1gram tylenol now. -CBC with Differential -Will consider IP antibiotics -Will continue other management as ordered.    Cherre Robins MSN, CNM

## 2018-11-08 NOTE — MAU Note (Signed)
Pt reports ctx since 0600 today and SROM in route to MAU.  Moderate Mec. Noted.  Pt reports good fetal movement.

## 2018-11-08 NOTE — Progress Notes (Signed)
Patient ID: Maria Brooks, female   DOB: 1991/02/14, 28 y.o.   MRN: [id number] Nursed called about low UOP and continued maternal tachycardia (120s)  BP 122/84   Pulse (!) 125   Temp 98.6 F (37 C) (Oral)   Resp 16   Ht 5\' 2"  (1.575 m)   Wt 97.5 kg   LMP 02/01/2018   SpO2 100%   BMI 39.32 kg/m   #low UOP Ordered CMP to be added to prior draw 1L LR bolus Monitor UOP closely BP is wnl which is reassuring for preeclampsia as cause of low UOP.  Federico Flake, MD, MPH, ABFM Attending Physician Faculty Practice- Center for [facility]

## 2018-11-08 NOTE — H&P (Signed)
OBSTETRIC ADMISSION HISTORY AND PHYSICAL  Maria Brooks is a 28 y.o. female G1P0000 with IUP at [redacted]w[redacted]d by LMP/8 presenting for PROM- grossly ruptured with thin med per MAU. Patient currently on lovenox for h/o Central Venous Thrombosis and took medication last night.  She reports +FMs, No LOF, no VB, no blurry vision, headaches or peripheral edema, and RUQ pain.  She plans on breastfeeding. She request  Undecided for birth control. She received her prenatal care at [facility]   Pregnancy complicated by: Patient Active Problem List   Diagnosis Date Noted  . Premature rupture of membranes 11/08/2018  . Supervision of normal pregnancy 04/15/2018  . 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 Obesity (BMI 35.0-39.9 without comorbidity) 09/25/2015    Dating: By L/8 --->  Estimated Date of Delivery: 11/08/18  Sono:    @[redacted]w[redacted]d , CWD, normal anatomy, breech presentation, tranverse lie, 281g, 41% EFW   Prenatal History/Complications:  Past Medical History: Past Medical History:  Diagnosis Date  . Allergy   . Chickenpox   . 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 GERD (gastroesophageal reflux disease)     Past Surgical History: Past Surgical History:  Procedure Laterality Date  . NO PAST SURGERIES    . none      Obstetrical History: OB History    Gravida  1   Para  0   Term  0   Preterm  0   AB  0   Living  0     SAB  0   TAB  0   Ectopic  0   Multiple  0   Live Births              Social History: Social History   Socioeconomic History  . Marital status: Married    Spouse name: Not on file  . Number of children: Not on file  . Years of education: Not on file  . Highest education level: Not on file  Occupational History  . Not on file  Social Needs  . Financial resource strain: Not on file  . Food insecurity:    Worry: Not on file    Inability: Not on file  . Transportation needs:    Medical: Not on file    Non-medical: Not on file   Tobacco Use  . Smoking status: Never Smoker  . Smokeless tobacco: Never Used  Substance and Sexual Activity  . Alcohol use: Not Currently    Alcohol/week: 0.0 standard drinks    Comment: rarely  . Drug use: No  . Sexual activity: Yes    Birth control/protection: None    Comment: intercourse age 77, sexual partners less than 5  Lifestyle  . Physical activity:    Days per week: Not on file    Minutes per session: Not on file  . Stress: Not on file  Relationships  . Social connections:    Talks on phone: Not on file    Gets together: Not on file    Attends religious service: Not on file    Active member of club or organization: Not on file    Attends meetings of clubs or organizations: Not on file    Relationship status: Not on file  Other Topics Concern  . Not on file  Social History Narrative   Married.    No children.   Works as an TEFL teacher.   Enjoys resorting furniture, art.     Family History: Family History  Problem Relation Age  of Onset  . Clotting disorder Mother        reportedly present before developing lung cancer  . Hyperlipidemia Father   . Hypertension Father   . Stroke Paternal Grandfather   . Heart disease Paternal Grandfather   . Cancer Maternal Grandmother        lung  . Cataracts Maternal Grandmother   . Alzheimer's disease Maternal Grandfather   . Heart failure Paternal Grandmother     Allergies: No Known Allergies  Medications Prior to Admission  Medication Sig Dispense Refill Last Dose  . enoxaparin (LOVENOX) 80 MG/0.8ML injection Inject 0.8 mLs (80 mg total) into the skin daily. 30 Syringe 6 11/07/2018 at 2300  . prenatal vitamin w/FE, FA (PRENATAL 1 + 1) 27-1 MG TABS tablet Take 1 tablet by mouth daily at 12 noon.   Taking     Review of Systems   All systems reviewed and negative except as stated in HPI  Blood pressure 140/83, pulse (!) 138, temperature 98.5 F (36.9 C), temperature source Oral, resp. rate 20, last menstrual  period 02/01/2018. General appearance: alert, cooperative and appears stated age Lungs: clear to auscultation bilaterally Heart: regular rate and rhythm Abdomen: soft, non-tender; bowel sounds normal Pelvic: adequate Extremities: Homans sign is negative, no sign of DVT DTR's wnl Presentation: cephalic Fetal monitoringBaseline: 145 bpm, Variability: Good {> 6 bpm), Accelerations: Reactive and Decelerations: Absent Uterine activityFrequency: Every 2-3 minutes Dilation: 2 Effacement (%): 60 Station: -2 Exam by:: Janeth Rase RNC   Prenatal labs: ABO, Rh: AB/Positive/-- (10/17 1538) Antibody: Negative (02/04 0911) Rubella: 2.28 (10/17 1538) RPR: Non Reactive (02/04 0911)  HBsAg: Negative (10/17 1538)  HIV: Non Reactive (02/04 0911)  GBS:    2 hr Glucola wnl Genetic screening  wnl NT Anatomy US WNL  Prenatal Transfer Tool  Maternal Diabetes: No Genetic Screening: Normal Maternal Ultrasounds/Referrals: Abnormal:  Findings:   Isolated choroid plexus cyst Fetal Ultrasounds or other Referrals:  None Maternal Substance Abuse:  No Significant Maternal Medications:  None Significant Maternal Lab Results: Lab values include: Group B Strep negative  Results for orders placed or performed during the hospital encounter of 11/08/18 (from the past 24 hour(s))  CBC   Collection Time: 11/08/18 11:08 AM  Result Value Ref Range   WBC 10.5 4.0 - 10.5 K/uL   RBC 4.67 3.87 - 5.11 MIL/uL   Hemoglobin 10.6 (L) 12.0 - 15.0 g/dL   HCT 56.2 (L) 13.0 - 86.5 %   MCV 74.9 (L) 80.0 - 100.0 fL   MCH 22.7 (L) 26.0 - 34.0 pg   MCHC 30.3 30.0 - 36.0 g/dL   RDW 78.4 (H) 69.6 - 29.5 %   Platelets 194 150 - 400 K/uL   nRBC 0.0 0.0 - 0.2 %  Fern Test   Collection Time: 11/08/18 11:21 AM  Result Value Ref Range   POCT Fern Test Positive = ruptured amniotic membanes     Patient Active Problem List   Diagnosis Date Noted  . Premature rupture of membranes 11/08/2018  . Supervision of normal  pregnancy 04/15/2018  . 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 Obesity (BMI 35.0-39.9 without comorbidity) 09/25/2015    Assessment/Plan:  Maria Brooks is a 28 y.o. G1P0000 at [redacted]w[redacted]d here for PROM   #Labor: Plan to augment as needed. Expectant managment #Pain: Epidural  #FWB:  Cat I #ID:  GBS neg #MOF: Breast #MOC: unsure, continue to discusse #Circ:  Female fetus  #h/o Central Venous Thrombus- provoked by COP: Last  took lovenox yesterday evening. Hold until after delivery, plan to restart within 12 hours of delivery. Plan to continue until 8 wk   #Maternal tachycardia- Given 1L bolus and hope that pain control lower maternal HR. Pulse ox and EFM show clear differentiation of maternal HR and fetal HR  Federico FlakeKimberly Niles Maria Wagoner, MD  11/08/2018, 11:36 AM

## 2018-11-09 ENCOUNTER — Encounter (HOSPITAL_COMMUNITY): Payer: Self-pay | Admitting: *Deleted

## 2018-11-09 DIAGNOSIS — O411290 Chorioamnionitis, unspecified trimester, not applicable or unspecified: Secondary | ICD-10-CM | POA: Clinically undetermined

## 2018-11-09 DIAGNOSIS — Z3A40 40 weeks gestation of pregnancy: Secondary | ICD-10-CM

## 2018-11-09 DIAGNOSIS — O4202 Full-term premature rupture of membranes, onset of labor within 24 hours of rupture: Secondary | ICD-10-CM

## 2018-11-09 LAB — RPR: RPR Ser Ql: NONREACTIVE

## 2018-11-09 LAB — HIV ANTIBODY (ROUTINE TESTING W REFLEX): HIV Screen 4th Generation wRfx: NONREACTIVE

## 2018-11-09 MED ORDER — ENOXAPARIN SODIUM 80 MG/0.8ML ~~LOC~~ SOLN
80.0000 mg | Freq: Every day | SUBCUTANEOUS | Status: DC
  Administered 2018-11-10 – 2018-11-11 (×2): 80 mg via SUBCUTANEOUS
  Filled 2018-11-09 (×2): qty 0.8

## 2018-11-09 MED ORDER — TETANUS-DIPHTH-ACELL PERTUSSIS 5-2.5-18.5 LF-MCG/0.5 IM SUSP: 0.5000 mL | Freq: Once | INTRAMUSCULAR | Status: DC

## 2018-11-09 MED ORDER — COCONUT OIL OIL: 1.0000 "application " | TOPICAL_OIL | Status: DC | PRN

## 2018-11-09 MED ORDER — OXYCODONE-ACETAMINOPHEN 5-325 MG PO TABS: 2.0000 | ORAL_TABLET | ORAL | Status: DC | PRN

## 2018-11-09 MED ORDER — BENZOCAINE-MENTHOL 20-0.5 % EX AERO
1.0000 "application " | INHALATION_SPRAY | CUTANEOUS | Status: DC | PRN
  Administered 2018-11-09: 1 via TOPICAL
  Filled 2018-11-09: qty 56

## 2018-11-09 MED ORDER — PRENATAL MULTIVITAMIN CH
1.0000 | ORAL_TABLET | Freq: Every day | ORAL | Status: DC
  Administered 2018-11-10: 12:00:00 1 via ORAL
  Filled 2018-11-09: qty 1

## 2018-11-09 MED ORDER — OXYCODONE-ACETAMINOPHEN 5-325 MG PO TABS: 1.0000 | ORAL_TABLET | ORAL | Status: DC | PRN

## 2018-11-09 MED ORDER — WITCH HAZEL-GLYCERIN EX PADS: 1.0000 "application " | MEDICATED_PAD | CUTANEOUS | Status: DC | PRN

## 2018-11-09 MED ORDER — DIPHENHYDRAMINE HCL 25 MG PO CAPS: 25.0000 mg | ORAL_CAPSULE | Freq: Four times a day (QID) | ORAL | Status: DC | PRN

## 2018-11-09 MED ORDER — DIBUCAINE (PERIANAL) 1 % EX OINT: 1.0000 "application " | TOPICAL_OINTMENT | CUTANEOUS | Status: DC | PRN

## 2018-11-09 MED ORDER — IBUPROFEN 600 MG PO TABS
600.0000 mg | ORAL_TABLET | Freq: Four times a day (QID) | ORAL | Status: DC
  Administered 2018-11-09 – 2018-11-11 (×7): 600 mg via ORAL
  Filled 2018-11-09 (×7): qty 1

## 2018-11-09 MED ORDER — MEASLES, MUMPS & RUBELLA VAC IJ SOLR: 0.5000 mL | Freq: Once | INTRAMUSCULAR | Status: DC

## 2018-11-09 MED ORDER — ONDANSETRON HCL 4 MG/2ML IJ SOLN: 4.0000 mg | INTRAMUSCULAR | Status: DC | PRN

## 2018-11-09 MED ORDER — TERBUTALINE SULFATE 1 MG/ML IJ SOLN: 0.2500 mg | Freq: Once | INTRAMUSCULAR | Status: DC | PRN

## 2018-11-09 MED ORDER — MAGNESIUM HYDROXIDE 400 MG/5ML PO SUSP: 30.0000 mL | ORAL | Status: DC | PRN

## 2018-11-09 MED ORDER — OXYTOCIN 40 UNITS IN NORMAL SALINE INFUSION - SIMPLE MED
1.0000 m[IU]/min | INTRAVENOUS | Status: DC
  Administered 2018-11-09: 01:00:00 2 m[IU]/min via INTRAVENOUS
  Filled 2018-11-09: qty 1000

## 2018-11-09 MED ORDER — SIMETHICONE 80 MG PO CHEW: 80.0000 mg | CHEWABLE_TABLET | ORAL | Status: DC | PRN

## 2018-11-09 MED ORDER — ACETAMINOPHEN 325 MG PO TABS: 650.0000 mg | ORAL_TABLET | ORAL | Status: DC | PRN

## 2018-11-09 MED ORDER — FERROUS SULFATE 325 (65 FE) MG PO TABS
325.0000 mg | ORAL_TABLET | Freq: Two times a day (BID) | ORAL | Status: DC
  Administered 2018-11-09 – 2018-11-11 (×4): 325 mg via ORAL
  Filled 2018-11-09 (×4): qty 1

## 2018-11-09 MED ORDER — ONDANSETRON HCL 4 MG PO TABS: 4.0000 mg | ORAL_TABLET | ORAL | Status: DC | PRN

## 2018-11-09 NOTE — Lactation Note (Signed)
This note was copied from a baby's chart. Lactation Consultation Note  Patient Name: Maria Brooks [id number] Date: 11/09/2018 Reason for consult: Initial assessment;Term  Baby is 5 hours old  As LC entered the room baby resting on moms chest STS/ no feeding cues noted.  Mom receptive to shifting her baby on her chest so LC could show her hand expressing.  Per mom has been leaking prior to delivery. LC reviewed basics of hand expressing with moms permission.  Colostrum easily expressed 1-2 ml and collected it in the colostrum container.  After a large meconium stool changed and Dr. Francia Greaves baby alert and wide awake.  LC offered to spoon feed the colostrum and as LC placed  The spoon on the baby's bottom  Lip the baby spit up a large amount of clear to mucous fluid x 2. LC and MBURN reassured mom  And dad its normal when a baby delivers fast and its beneficial she had a large mec stool.  LC reviewed the use of the bulb syringe and both mom and dad expressed feeling comfortable with using it.  Mom aware of the virtual BFSG/ the [facility] O/P services and the breast feeding help line.     Maternal Data Has patient been taught Hand Expression?: Yes(easily hand expressed - per mom had been leaking  prior to delivery ) Does the patient have breastfeeding experience prior to this delivery?: No  Feeding Feeding Type: (baby to spitty to try to latch / STS assisted )  LATCH Score                   Interventions Interventions: Breast feeding basics reviewed  Lactation Tools Discussed/Used     Consult Status Consult Status: Follow-up(mom aware to call with feedinfg cues ) Date: 11/09/18 Follow-up type: In-patient    Maria Brooks 11/09/2018, 5:21 PM

## 2018-11-09 NOTE — Discharge Summary (Signed)
Obstetrics Discharge Summary OB/GYN Faculty Practice   Patient Name: Maria Brooks DOB: 1991-06-28 MRN: [id number]  Date of admission: 11/08/2018 Delivering MD: Calvert Cantor   Date of discharge: 11/11/2018  Admitting diagnosis: CTX, water broke Intrauterine pregnancy: [redacted]w[redacted]d     Secondary diagnosis:   Active Problems:   Premature rupture of membranes   Acute chorioamnionitis   Additional problems:  . None     Discharge diagnosis: Term Pregnancy Delivered                                            Postpartum procedures: None  Complications: none  Outpatient Follow-Up: --Undecided about contraception --Plan for in office visit in 4-6 weeks depending on choice of contraception --Clinic messaged 11/09/18  Hospital course: Maria Brooks is a 28 y.o. [redacted]w[redacted]d who was admitted for PROM and in early labor. Her pregnancy was complicated by Obesity (BMI 35.0-39.9 without comorbidity); Cerebral venous sinus thrombosis; and Supervision of normal pregnancy.   Her labor course was notable for PROM and Acute Chorioamnionitis. Delivery was complicated uncomplicated. Please see delivery note for additional details. Her postpartum course was uncomplicated. She was breastfeeding without difficulty. By day of discharge, she was passing flatus, urinating, eating and drinking without difficulty. Her pain was well-controlled, and she was discharged home with baby. She will follow-up in clinic in 4 weeks.   Physical exam  Vitals:   11/10/18 0315 11/10/18 1401 11/10/18 2118 11/11/18 0500  BP: 109/73 123/76 123/82 119/76  Pulse: (!) 106 85 91 89  Resp: 16 16 18 16   Temp: 97.9 F (36.6 C) 98.1 F (36.7 C) 98.7 F (37.1 C) 98.9 F (37.2 C)  TempSrc: Oral Oral Oral Oral  SpO2: 96%   98%  Weight:      Height:       General: Alert and oriented, in no apparent distress Lochia: appropriate Uterine Fundus: firm Incision: N/A DVT Evaluation: No evidence of DVT seen on physical  exam. Labs: Lab Results  Component Value Date   WBC 15.6 (H) 11/08/2018   HGB 8.9 (L) 11/08/2018   HCT 30.1 (L) 11/08/2018   MCV 75.4 (L) 11/08/2018   PLT 166 11/08/2018   CMP Latest Ref Rng & Units 11/08/2018  Glucose 70 - 99 mg/dL 99  BUN 6 - 20 mg/dL 7  Creatinine 4.12 - 8.78 mg/dL 6.76  Sodium 720 - 947 mmol/L 135  Potassium 3.5 - 5.1 mmol/L 4.1  Chloride 98 - 111 mmol/L 107  CO2 22 - 32 mmol/L 16(L)  Calcium 8.9 - 10.3 mg/dL 9.1  Total Protein 6.5 - 8.1 g/dL 6.6  Total Bilirubin 0.3 - 1.2 mg/dL 0.5  Alkaline Phos 38 - 126 U/L 190(H)  AST 15 - 41 U/L 21  ALT 0 - 44 U/L 12    Discharge instructions: Per After Visit Summary and "Baby and Me Booklet"  After visit meds:  Allergies as of 11/11/2018   No Known Allergies     Medication List    TAKE these medications   acetaminophen 325 MG tablet Commonly known as:  Tylenol Take 2 tablets (650 mg total) by mouth every 4 (four) hours as needed (for pain scale < 4).   enoxaparin 80 MG/0.8ML injection Commonly known as:  LOVENOX Inject 0.8 mLs (80 mg total) into the skin daily.   prenatal vitamin w/FE, FA 27-1 MG Tabs tablet Take  1 tablet by mouth daily at 12 noon.       Postpartum contraception: Undecided Diet: Routine Diet Activity: Advance as tolerated. Pelvic rest for 6 weeks.   Follow-up Appt: Future Appointments  Date Time Provider Department Center  12/15/2018  1:30 PM Cheral MarkerBooker, Kimberly R, CNM [facility]   Follow-up Visit:No follow-ups on file.  Newborn Data: Live born female  Birth Weight:   APGAR: ,   Newborn Delivery   Birth date/time:  11/09/2018 11:46:00 Delivery type:  Vaginal, Spontaneous     Baby Feeding: Breast Disposition:home with mother

## 2018-11-09 NOTE — Progress Notes (Addendum)
Maria Brooks MRN: [id number]  Subjective: -Patient resting in bed.  Reports resolution of chills.   Objective: BP (!) 107/50   Pulse (!) 130   Temp 99.8 F (37.7 C) (Oral)   Resp 19   Ht 5\' 2"  (1.575 m)   Wt 97.5 kg   LMP 02/01/2018   SpO2 93%   BMI 39.32 kg/m  I/O last 3 completed shifts: In: -  Out: 200 [Urine:200] Total I/O In: 989.6 [I.V.:989.6] Out: 145 [Urine:145]  Results for orders placed or performed during the hospital encounter of 11/08/18 (from the past 24 hour(s))  CBC     Status: Abnormal   Collection Time: 11/08/18 11:08 AM  Result Value Ref Range   WBC 10.5 4.0 - 10.5 K/uL   RBC 4.67 3.87 - 5.11 MIL/uL   Hemoglobin 10.6 (L) 12.0 - 15.0 g/dL   HCT 62.135.0 (L) 30.836.0 - 65.746.0 %   MCV 74.9 (L) 80.0 - 100.0 fL   MCH 22.7 (L) 26.0 - 34.0 pg   MCHC 30.3 30.0 - 36.0 g/dL   RDW 84.615.9 (H) 96.211.5 - 95.215.5 %   Platelets 194 150 - 400 K/uL   nRBC 0.0 0.0 - 0.2 %  Type and screen [facility]     Status: None   Collection Time: 11/08/18 11:08 AM  Result Value Ref Range   ABO/RH(D) AB POS    Antibody Screen NEG    Sample Expiration      11/11/2018 Performed at [facility], [address] [place], [place] [address]   ABO/Rh     Status: None   Collection Time: 11/08/18 11:08 AM  Result Value Ref Range   ABO/RH(D)      AB POS Performed at [facility], [address] [place], [place] [address]   Comprehensive metabolic panel     Status: Abnormal   Collection Time: 11/08/18 11:08 AM  Result Value Ref Range   Sodium 135 135 - 145 mmol/L   Potassium 4.1 3.5 - 5.1 mmol/L   Chloride 107 98 - 111 mmol/L   CO2 16 (L) 22 - 32 mmol/L   Glucose, Bld 99 70 - 99 mg/dL   BUN 7 6 - 20 mg/dL   Creatinine, Ser 2.720.68 0.44 - 1.00 mg/dL   Calcium 9.1 8.9 - 53.610.3 mg/dL   Total Protein 6.6 6.5 - 8.1 g/dL   Albumin 2.8 (L) 3.5 - 5.0 g/dL   AST 21 15 - 41 U/L   ALT 12 0 - 44 U/L   Alkaline Phosphatase 190 (H) 38 - 126 U/L   Total Bilirubin 0.5 0.3 -  1.2 mg/dL   GFR calc non Af Amer >60 >60 mL/min   GFR calc Af Amer >60 >60 mL/min   Anion gap 12 5 - 15  Fern Test     Status: None   Collection Time: 11/08/18 11:21 AM  Result Value Ref Range   POCT Fern Test Positive = ruptured amniotic membanes   CBC with Differential/Platelet     Status: Abnormal   Collection Time: 11/08/18 10:56 PM  Result Value Ref Range   WBC 15.6 (H) 4.0 - 10.5 K/uL   RBC 3.99 3.87 - 5.11 MIL/uL   Hemoglobin 8.9 (L) 12.0 - 15.0 g/dL   HCT 64.430.1 (L) 03.436.0 - 74.246.0 %   MCV 75.4 (L) 80.0 - 100.0 fL   MCH 22.3 (L) 26.0 - 34.0 pg   MCHC 29.6 (L) 30.0 - 36.0 g/dL  RDW 15.9 (H) 11.5 - 15.5 %   Platelets 166 150 - 400 K/uL   nRBC 0.0 0.0 - 0.2 %   Neutrophils Relative % 87 %   Neutro Abs 13.5 (H) 1.7 - 7.7 K/uL   Lymphocytes Relative 7 %   Lymphs Abs 1.1 0.7 - 4.0 K/uL   Monocytes Relative 6 %   Monocytes Absolute 1.0 0.1 - 1.0 K/uL   Eosinophils Relative 0 %   Eosinophils Absolute 0.0 0.0 - 0.5 K/uL   Basophils Relative 0 %   Basophils Absolute 0.0 0.0 - 0.1 K/uL   Immature Granulocytes 0 %   Abs Immature Granulocytes 0.07 0.00 - 0.07 K/uL    Fetal Monitoring: FHT: 145 bpm, Mod Var, -Decels, +Accels UC: palpates moderate    Vaginal Exam: SVE:   Dilation: 6 Effacement (%): 80 Station: -2 Exam by:: Gerrit Heck, RN Membranes:SROM x 13 hours Internal Monitors: FSE in place, IUPC inserted w/o difficulty  Augmentation/Induction: Pitocin:Initiated at 88mUn/min Cytotec: None  Assessment:  IUP at 40.1 weeks Cat I FT  Chorioamnionitis Labor Augmentation  Plan: -Discussed CBC findings and informed of chorioamnionitis in setting of elevated WBCs, fever, and maternal tachycardia.   -Reviewed POC including: *Initiation of antibiotic regime; Amp 2g Q6 and Gent 5mg /kg once *Exam findings and recommendation for pitocin initiation *Discussed IUPC placement and r/b, prior to insertion, including increased risk of infection and ability to adequately monitor  quantity and strength of contractions. -Patient agreeable to POC and has no questions or concerns. -Pitocin ordered at 88mUn/min to increase by 61mUn/min -Continue other mgmt as ordered  Valma Cava, CNM 11/09/2018, 12:41 AM

## 2018-11-09 NOTE — Progress Notes (Addendum)
LOIE ALLINGHAM MRN: [id number]  Subjective: -Nurse reports patient c/o rectal pressure.  Strip and Chart Reviewed.  Objective: BP 111/65   Pulse (!) 129   Temp 99.4 F (37.4 C) (Oral)   Resp 18   Ht 5\' 2"  (1.575 m)   Wt 97.5 kg   LMP 02/01/2018   SpO2 97%   BMI 39.32 kg/m  I/O last 3 completed shifts: In: -  Out: 200 [Urine:200] Total I/O In: 1917.9 [I.V.:1755.6; IV Piggyback:162.3] Out: 275 [Urine:275]  Fetal Monitoring: FHT:  145 bpm, Mod Var, -Decels, +Accels UC: Q2-64min, MVUs    Vaginal Exam: SVE:   Dilation: 6 Effacement (%): 80 Station: -2 Exam by:: Gerrit Heck, RN Membranes:SROM x 17 hrs Internal Monitors: FSE & IUPC  Augmentation/Induction: Pitocin:63mUn/min Cytotec: None  Assessment:  IUP at 40.1 weeks Cat I FT  Chorioamnionitis  Labor Augmentation Rectal Pressure  Plan: -Nurse instructed to complete VE. -Will await results. -Continue other mgmt as ordered. -Anticipate SVD  Kinsley Holderman L Aedin Jeansonne,MSN, CNM 11/09/2018, 4:09 AM  Addendum 4:24 AM  Nurse reports VE unchanged. UOP remains minimal.  Continue titration of pitocin. Give fluid bolus now. Continue other mgmt as ordered. Plan for next VE at least one hour after adequate contractions achieved.  Cherre Robins MSN, CNM

## 2018-11-09 NOTE — Plan of Care (Signed)

## 2018-11-09 NOTE — Progress Notes (Signed)
Maria Brooks is a 28 y.o. G1P0000 at [redacted]w[redacted]d  admitted for early labor following PROM 11/08/18  Subjective: Dozing comfortably with epidural infusing. Denies pain. Patient is anxious about increasing sensation of perineal pressure and worries her epidural will spontaneously stop infusing.  Objective: BP (!) 80/63   Pulse (!) 153   Temp 99.3 F (37.4 C) (Oral)   Resp 18   Ht 5\' 2"  (1.575 m)   Wt 97.5 kg   LMP 02/01/2018   SpO2 93%   BMI 39.32 kg/m  I/O last 3 completed shifts: In: 2871.1 [I.V.:2681.4; Other:4; IV Piggyback:185.7] Out: 660 [Urine:660] Total I/O In: -  Out: 60 [Urine:60]  FHT:  FHR: 150 bpm, variability: moderate,  accelerations:  Present,  decelerations:  Absent UC:   regular, every 2-4 minutes SVE:   Dilation: 8 Effacement (%): 90 Station: -1, 0 Exam by:: Enis Slipper, RN  Labs: Lab Results  Component Value Date   WBC 15.6 (H) 11/08/2018   HGB 8.9 (L) 11/08/2018   HCT 30.1 (L) 11/08/2018   MCV 75.4 (L) 11/08/2018   PLT 166 11/08/2018    Assessment / Plan: Category I tracing Triple I, Amp/Gent previously ordered. Afebrile, no fetal tachycardia Epidural managing discomfort. Reassured change in sensation is expected as baby engages Anticipate NSVD  Calvert Cantor, CNM 11/09/2018, [phone]

## 2018-11-10 ENCOUNTER — Other Ambulatory Visit: Payer: BLUE CROSS/BLUE SHIELD

## 2018-11-10 ENCOUNTER — Encounter: Payer: BLUE CROSS/BLUE SHIELD | Admitting: Obstetrics & Gynecology

## 2018-11-10 LAB — ABO/RH: ABO/RH(D): AB POS

## 2018-11-10 NOTE — Anesthesia Postprocedure Evaluation (Signed)
Anesthesia Post Note  Patient: Maria Brooks  Procedure(s) Performed: AN AD HOC LABOR EPIDURAL     Patient location during evaluation: Mother Baby Anesthesia Type: Epidural Level of consciousness: awake and alert Pain management: pain level controlled Vital Signs Assessment: post-procedure vital signs reviewed and stable Respiratory status: spontaneous breathing Cardiovascular status: blood pressure returned to baseline Postop Assessment: no headache, no backache, patient able to bend at knees, no apparent nausea or vomiting, epidural receding, adequate PO intake and able to ambulate Anesthetic complications: no    Last Vitals:  Vitals:   11/09/18 2315 11/10/18 0315  BP: 108/71 109/73  Pulse: (!) 106 (!) 106  Resp: 18 16  Temp: 36.8 C 36.6 C  SpO2: 96% 96%    Last Pain:  Vitals:   11/10/18 0315  TempSrc: Oral  PainSc: 0-No pain   Pain Goal: Patients Stated Pain Goal: 8 (11/09/18 0726)                 Salome Arnt

## 2018-11-10 NOTE — Progress Notes (Signed)
Post Partum Day 1 Subjective: no complaints  Objective: Blood pressure 109/73, pulse (!) 106, temperature 97.9 F (36.6 C), temperature source Oral, resp. rate 16, height 5\' 2"  (1.575 m), weight 97.5 kg, last menstrual period 02/01/2018, SpO2 96 %, unknown if currently breastfeeding.  Physical Exam:  General: alert, cooperative and no distress Lochia: appropriate Uterine Fundus: firm Incision: none DVT Evaluation: No evidence of DVT seen on physical exam.  Recent Labs    11/08/18 1108 11/08/18 2256  HGB 10.6* 8.9*  HCT 35.0* 30.1*    Assessment/Plan: Plan for discharge tomorrow and Breastfeeding   LOS: 2 days   Scheryl Darter 11/10/2018, 9:11 AM

## 2018-11-10 NOTE — Lactation Note (Signed)
This note was copied from a baby's chart. Lactation Consultation Note  Patient Name: Maria Brooks OZDGU'Y Date: 11/10/2018 Reason for consult: Term;Follow-up assessment;Infant weight loss;Primapara;1st time breastfeeding(5% weight loss / bili decreased - 5.9 at 33 hours old / mom aware to page for latch  check with feeding cues ) Baby is 27 hours old/  As LC entered the room / baby in bed asleep with mom.  Per mom last fed at 1 pm for 10 mins and the spitting has pretty much subsided / occasional.  Per MBURN latch score this am was 8 - 2 for swallows.    Maternal Data    Feeding Feeding Type: (baby last fed at 1300 for 10 mins and mom reports swallows )  LATCH Score                   Interventions Interventions: Breast feeding basics reviewed  Lactation Tools Discussed/Used     Consult Status Consult Status: Follow-up Date: 11/11/18 Follow-up type: In-patient    Maria Brooks 11/10/2018, 3:07 PM

## 2018-11-11 MED ORDER — ACETAMINOPHEN 325 MG PO TABS: 650.0000 mg | ORAL_TABLET | ORAL | 1 refills | Status: DC | PRN

## 2018-11-11 NOTE — Discharge Instructions (Signed)
Vaginal Delivery, Care After °Refer to this sheet in the next few weeks. These instructions provide you with information about caring for yourself after vaginal delivery. Your health care provider may also give you more specific instructions. Your treatment has been planned according to current medical practices, but problems sometimes occur. Call your health care provider if you have any problems or questions. °What can I expect after the procedure? °After vaginal delivery, it is common to have: °· Some bleeding from your vagina. °· Soreness in your abdomen, your vagina, and the area of skin between your vaginal opening and your anus (perineum). °· Pelvic cramps. °· Fatigue. °Follow these instructions at home: °Medicines °· Take over-the-counter and prescription medicines only as told by your health care provider. °· If you were prescribed an antibiotic medicine, take it as told by your health care provider. Do not stop taking the antibiotic until it is finished. °Driving ° °· Do not drive or operate heavy machinery while taking prescription pain medicine. °· Do not drive for 24 hours if you received a sedative. °Lifestyle °· Do not drink alcohol. This is especially important if you are breastfeeding or taking medicine to relieve pain. °· Do not use tobacco products, including cigarettes, chewing tobacco, or e-cigarettes. If you need help quitting, ask your health care provider. °Eating and drinking °· Drink at least 8 eight-ounce glasses of water every day unless you are told not to by your health care provider. If you choose to breastfeed your baby, you may need to drink more water than this. °· Eat high-fiber foods every day. These foods may help prevent or relieve constipation. High-fiber foods include: °? Whole grain cereals and breads. °? Brown rice. °? Beans. °? Fresh fruits and vegetables. °Activity °· Return to your normal activities as told by your health care provider. Ask your health care provider what  activities are safe for you. °· Rest as much as possible. Try to rest or take a nap when your baby is sleeping. °· Do not lift anything that is heavier than your baby or 10 lb (4.5 kg) until your health care provider says that it is safe. °· Talk with your health care provider about when you can engage in sexual activity. This may depend on your: °? Risk of infection. °? Rate of healing. °? Comfort and desire to engage in sexual activity. °Vaginal Care °· If you have an episiotomy or a vaginal tear, check the area every day for signs of infection. Check for: °? More redness, swelling, or pain. °? More fluid or blood. °? Warmth. °? Pus or a bad smell. °· Do not use tampons or douches until your health care provider says this is safe. °· Watch for any blood clots that may pass from your vagina. These may look like clumps of dark red, brown, or black discharge. °General instructions °· Keep your perineum clean and dry as told by your health care provider. °· Wear loose, comfortable clothing. °· Wipe from front to back when you use the toilet. °· Ask your health care provider if you can shower or take a bath. If you had an episiotomy or a perineal tear during labor and delivery, your health care provider may tell you not to take baths for a certain length of time. °· Wear a bra that supports your breasts and fits you well. °· If possible, have someone help you with household activities and help care for your baby for at least a few days after you   leave the hospital. °· Keep all follow-up visits for you and your baby as told by your health care provider. This is important. °Contact a health care provider if: °· You have: °? Vaginal discharge that has a bad smell. °? Difficulty urinating. °? Pain when urinating. °? A sudden increase or decrease in the frequency of your bowel movements. °? More redness, swelling, or pain around your episiotomy or vaginal tear. °? More fluid or blood coming from your episiotomy or vaginal  tear. °? Pus or a bad smell coming from your episiotomy or vaginal tear. °? A fever. °? A rash. °? Little or no interest in activities you used to enjoy. °? Questions about caring for yourself or your baby. °· Your episiotomy or vaginal tear feels warm to the touch. °· Your episiotomy or vaginal tear is separating or does not appear to be healing. °· Your breasts are painful, hard, or turn red. °· You feel unusually sad or worried. °· You feel nauseous or you vomit. °· You pass large blood clots from your vagina. If you pass a blood clot from your vagina, save it to show to your health care provider. Do not flush blood clots down the toilet without having your health care provider look at them. °· You urinate more than usual. °· You are dizzy or light-headed. °· You have not breastfed at all and you have not had a menstrual period for 12 weeks after delivery. °· You have stopped breastfeeding and you have not had a menstrual period for 12 weeks after you stopped breastfeeding. °Get help right away if: °· You have: °? Pain that does not go away or does not get better with medicine. °? Chest pain. °? Difficulty breathing. °? Blurred vision or spots in your vision. °? Thoughts about hurting yourself or your baby. °· You develop pain in your abdomen or in one of your legs. °· You develop a severe headache. °· You faint. °· You bleed from your vagina so much that you fill two sanitary pads in one hour. °This information is not intended to replace advice given to you by your health care provider. Make sure you discuss any questions you have with your health care provider. °Document Released: 06/28/2000 Document Revised: 12/13/2015 Document Reviewed: 07/16/2015 °Elsevier Interactive Patient Education © 2019 [employer] ° °

## 2018-12-14 ENCOUNTER — Encounter: Payer: Self-pay | Admitting: *Deleted

## 2018-12-15 ENCOUNTER — Other Ambulatory Visit: Payer: Self-pay

## 2018-12-15 ENCOUNTER — Ambulatory Visit (INDEPENDENT_AMBULATORY_CARE_PROVIDER_SITE_OTHER): Payer: BC Managed Care – PPO | Admitting: Women's Health

## 2018-12-15 ENCOUNTER — Encounter: Payer: Self-pay | Admitting: Women's Health

## 2018-12-15 DIAGNOSIS — G08 Intracranial and intraspinal phlebitis and thrombophlebitis: Secondary | ICD-10-CM

## 2018-12-15 NOTE — Progress Notes (Signed)
TELEHEALTH VIRTUAL POSTPARTUM VISIT ENCOUNTER NOTE Patient name: Maria Brooks MRN [id number]  Date of birth: [birth year]  I connected with patient on 12/15/18 at  1:30 PM EDT by [facility] and verified that I am speaking with the correct person using two identifiers. Due to COVID-19 recommendations, pt is not currently in our office.    I discussed the limitations, risks, security and privacy concerns of performing an evaluation and management service by telephone and the availability of in person appointments. I also discussed with the patient that there may be a patient responsible charge related to this service. The patient expressed understanding and agreed to proceed.  Chief Complaint:   postpartum visit  History of Present Illness:   Maria Brooks is a 28 y.o. G11P1001 Caucasian female being evaluated today for a postpartum visit. She is 5 weeks postpartum following a spontaneous vaginal delivery at 40.1 gestational weeks. Anesthesia: epidural. Laceration: 2nd degree. I have fully reviewed the prenatal and intrapartum course. 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 Postpartum course has been uncomplicated. Bleeding thin lochia. Bowel function is normal. Bladder function is normal.  Patient is not sexually active. Last sexual activity: prior to birth of baby.  Contraception method is undecided, problably condoms for now.  Last pap 04/30/18.  Results were normal .  Patient's last menstrual period was 02/01/2018.  Baby's course has been uncomplicated. Baby is feeding by mostly breast, occ formula.   Edinburgh Postpartum Depression Screening: negative Edinburgh Postnatal Depression Scale - 12/15/18 1349      Edinburgh Postnatal Depression Scale:  In the Past 7 Days   I have been able to laugh and see the funny side of things.  0    I have looked forward with enjoyment to things.  0    I have blamed myself  unnecessarily when things went wrong.  2    I have been anxious or worried for no good reason.  1    I have felt scared or panicky for no good reason.  0    Things have been getting on top of me.  1    I have been so unhappy that I have had difficulty sleeping.  0    I have felt sad or miserable.  0    I have been so unhappy that I have been crying.  1    The thought of harming myself has occurred to me.  0    Edinburgh Postnatal Depression Scale Total  5      Review of Systems:   Pertinent items are noted in HPI Denies Abnormal vaginal discharge w/ itching/odor/irritation, headaches, visual changes, shortness of breath, chest pain, abdominal pain, severe nausea/vomiting, or problems with urination or bowel movements. Pertinent History Reviewed:  Reviewed past medical,surgical, obstetrical and family history.  Reviewed problem list, medications and allergies. OB History  Gravida Para Term Preterm AB Living  1 1 1  0 0 1  SAB TAB Ectopic Multiple Live Births  0 0 0 0 1    # Outcome Date GA Lbr Len/2nd Weight Sex Delivery Anes PTL Lv  1 Term 11/09/18 [redacted]w[redacted]d 28:34 / 01:12 8 lb 6.6 oz (3.816 kg) F Vag-Spont EPI, Local  LIV     Birth Comments: WNL   Physical Assessment:   Vitals:   12/15/18 1344  BP: 113/69  Pulse: 92  Weight: 197 lb (89.4 kg)  Height: 5\' 2"  (1.575 m)  Body mass  index is 36.03 kg/m.       Physical Examination:  General:  Alert, oriented and cooperative.   Mental Status: Normal mood and affect perceived. Normal judgment and thought content.  Rest of physical exam deferred due to type of encounter       No results found for this or any previous visit (from the past 24 hour(s)).  Assessment & Plan:  1) Postpartum exam 2) 5 wks s/p SVB 3) Breast & bottlefeeding 4) Depression screening 5) Contraception counseling, pt prefers condoms  6) H/O cerebral venous sinus thrombosis> stop Lovenox Mon 6/8 (6wks pp)  Meds: No orders of the defined types were placed in  this encounter.   I discussed the assessment and treatment plan with the patient. The patient was provided an opportunity to ask questions and all were answered. The patient agreed with the plan and demonstrated an understanding of the instructions.   The patient was advised to call back or seek an in-person evaluation/go to the ED for any concerning postpartum symptoms.  I provided 15 minutes of non-face-to-face time during this encounter.  Follow-up: No follow-ups on file.   No orders of the defined types were placed in this encounter.   Cheral MarkerKimberly R Kiannah Grunow CNM, [facility] 12/15/2018 2:04 PM

## 2018-12-15 NOTE — Patient Instructions (Signed)
Tips To Increase Milk Supply  Lots of water! Enough so that your urine is clear  Plenty of calories, if you're not getting enough calories, your milk supply can decrease  Breastfeed/pump often, every 2-3 hours x 20-30mins  Fenugreek 3 pills 3 times a day, this may make your urine smell like maple syrup  Mother's Milk Tea  Lactation cookies, google for the recipe  Real oatmeal   

## 2019-09-14 ENCOUNTER — Other Ambulatory Visit: Payer: Self-pay

## 2019-09-14 ENCOUNTER — Ambulatory Visit (INDEPENDENT_AMBULATORY_CARE_PROVIDER_SITE_OTHER): Payer: BC Managed Care – PPO

## 2019-09-14 VITALS — BP 120/84 | HR 113 | Ht 62.0 in | Wt 229.0 lb

## 2019-09-14 DIAGNOSIS — Z3201 Encounter for pregnancy test, result positive: Secondary | ICD-10-CM

## 2019-09-14 DIAGNOSIS — N926 Irregular menstruation, unspecified: Secondary | ICD-10-CM

## 2019-09-14 LAB — POCT URINE PREGNANCY: Preg Test, Ur: POSITIVE — AB

## 2019-09-14 NOTE — Progress Notes (Signed)
   NURSE VISIT- PREGNANCY CONFIRMATION   SUBJECTIVE:  Maria Brooks is a 29 y.o. G2P1001 female at [redacted]w[redacted]d by  of Patient's last menstrual period was 08/04/2019. Here for pregnancy confirmation.  Home pregnancy test postive:   She reports nauseated when smell something . taking prenatal vitamins.  Was on Lovennx last pregnancy, done need start back on, have some on hand. Will route note jennifer griffin. OBJECTIVE:  BP 120/84 (BP Location: Right Arm, Patient Position: Sitting, Cuff Size: Normal)   Pulse (!) 113   Ht 5\' 2"  (1.575 m)   Wt 229 lb (103.9 kg)   LMP 08/04/2019   BMI 41.88 kg/m   Appears well, in no apparent distress OB History  Gravida Para Term Preterm AB Living  2 1 1  0 0 1  SAB TAB Ectopic Multiple Live Births  0 0 0 0 1    # Outcome Date GA Lbr Len/2nd Weight Sex Delivery Anes PTL Lv  2 Current           1 Term 11/09/18 [redacted]w[redacted]d 28:34 / 01:12 8 lb 6.6 oz (3.816 kg) F Vag-Spont EPI, Local  LIV     Birth Comments: WNL      ASSESSMENT: Positive pregnancy test, [redacted]w[redacted]d by LMP 08-04-2019   PLAN: Schedule for dating ultrasound in 3 weeks  Prenatal vitamins:    Nausea medicines not needed   OB packet given [redacted]w[redacted]d [facility]  09/14/2019 10:02 AM

## 2019-09-14 NOTE — Addendum Note (Signed)
Addended by: Federico Flake A on: 09/14/2019 10:15 AM   Modules accepted: Orders

## 2019-09-14 NOTE — Progress Notes (Signed)
Chart reviewed for nurse visit. Agree with plan of care.  Adline Potter, NP 09/14/2019 10:27 AM

## 2019-10-04 ENCOUNTER — Other Ambulatory Visit: Payer: Self-pay | Admitting: Obstetrics & Gynecology

## 2019-10-04 DIAGNOSIS — O3680X0 Pregnancy with inconclusive fetal viability, not applicable or unspecified: Secondary | ICD-10-CM

## 2019-10-05 ENCOUNTER — Ambulatory Visit (INDEPENDENT_AMBULATORY_CARE_PROVIDER_SITE_OTHER): Payer: BC Managed Care – PPO

## 2019-10-05 ENCOUNTER — Other Ambulatory Visit: Payer: Self-pay

## 2019-10-05 DIAGNOSIS — Z3A08 8 weeks gestation of pregnancy: Secondary | ICD-10-CM

## 2019-10-05 DIAGNOSIS — O3680X0 Pregnancy with inconclusive fetal viability, not applicable or unspecified: Secondary | ICD-10-CM | POA: Diagnosis not present

## 2019-10-05 NOTE — Progress Notes (Signed)
[place] 8+6 wks single IUP with YS,crl 22.63 mm,FHR 171 bpm,normal ovaries

## 2019-11-01 ENCOUNTER — Other Ambulatory Visit: Payer: Self-pay | Admitting: Obstetrics & Gynecology

## 2019-11-01 DIAGNOSIS — Z3682 Encounter for antenatal screening for nuchal translucency: Secondary | ICD-10-CM

## 2019-11-02 ENCOUNTER — Ambulatory Visit: Payer: BC Managed Care – PPO | Admitting: *Deleted

## 2019-11-02 ENCOUNTER — Ambulatory Visit (INDEPENDENT_AMBULATORY_CARE_PROVIDER_SITE_OTHER): Payer: BC Managed Care – PPO | Admitting: Women's Health

## 2019-11-02 ENCOUNTER — Encounter: Payer: Self-pay | Admitting: Women's Health

## 2019-11-02 ENCOUNTER — Ambulatory Visit (INDEPENDENT_AMBULATORY_CARE_PROVIDER_SITE_OTHER): Payer: BC Managed Care – PPO

## 2019-11-02 ENCOUNTER — Other Ambulatory Visit: Payer: Self-pay

## 2019-11-02 VITALS — Wt 218.0 lb

## 2019-11-02 DIAGNOSIS — Z6841 Body Mass Index (BMI) 40.0 and over, adult: Secondary | ICD-10-CM | POA: Insufficient documentation

## 2019-11-02 DIAGNOSIS — Z3A13 13 weeks gestation of pregnancy: Secondary | ICD-10-CM

## 2019-11-02 DIAGNOSIS — G08 Intracranial and intraspinal phlebitis and thrombophlebitis: Secondary | ICD-10-CM

## 2019-11-02 DIAGNOSIS — Z3682 Encounter for antenatal screening for nuchal translucency: Secondary | ICD-10-CM | POA: Diagnosis not present

## 2019-11-02 DIAGNOSIS — Z3480 Encounter for supervision of other normal pregnancy, unspecified trimester: Secondary | ICD-10-CM | POA: Insufficient documentation

## 2019-11-02 DIAGNOSIS — Z3481 Encounter for supervision of other normal pregnancy, first trimester: Secondary | ICD-10-CM

## 2019-11-02 LAB — POCT URINALYSIS DIPSTICK OB
Blood, UA: NEGATIVE
Glucose, UA: NEGATIVE
Leukocytes, UA: NEGATIVE
Nitrite, UA: NEGATIVE
POC,PROTEIN,UA: NEGATIVE

## 2019-11-02 MED ORDER — ENOXAPARIN SODIUM 80 MG/0.8ML ~~LOC~~ SOLN: 80.0000 mg | SUBCUTANEOUS | 6 refills | Status: DC

## 2019-11-02 NOTE — Patient Instructions (Signed)
Maria Brooks, I greatly value your feedback.  If you receive a survey following your visit with [place] today, we appreciate you taking the time to fill it out.  Thanks, [name], CNM, WHNP-BC   [facility] ([address] [place], [place] [address]) Entrance C, located off of [address] Free 24/7 valet parking   Nausea & Vomiting  Have saltine crackers or pretzels by your bed and eat a few bites before you raise your head out of bed in the morning  Eat small frequent meals throughout the day instead of large meals  Drink plenty of fluids throughout the day to stay hydrated, just don't drink a lot of fluids with your meals.  This can make your stomach fill up faster making you feel sick  Do not brush your teeth right after you eat  Products with real ginger are good for nausea, like ginger ale and ginger hard candy Make sure it says made with real ginger!  Sucking on sour candy like lemon heads is also good for nausea  If your prenatal vitamins make you nauseated, take them at night so you will sleep through the nausea  Sea Bands  If you feel like you need medicine for the nausea & vomiting please let us know  If you are unable to keep any fluids or food down please let us know   Constipation  Drink plenty of fluid, preferably water, throughout the day  Eat foods high in fiber such as fruits, vegetables, and grains  Exercise, such as walking, is a good way to keep your bowels regular  Drink warm fluids, especially warm prune juice, or decaf coffee  Eat a 1/2 cup of real oatmeal (not instant), 1/2 cup applesauce, and 1/2-1 cup warm prune juice every day  If needed, you may take Colace (docusate sodium) stool softener once or twice a day to help keep the stool soft.   If you still are having problems with constipation, you may take Miralax once daily as needed to help keep your bowels regular.   Home Blood Pressure Monitoring for Patients    Your provider has recommended that you check your blood pressure (BP) at least once a week at home. If you do not have a blood pressure cuff at home, one will be provided for you. Contact your provider if you have not received your monitor within 1 week.   Helpful Tips for Accurate Home Blood Pressure Checks  . Don't smoke, exercise, or drink caffeine 30 minutes before checking your BP . Use the restroom before checking your BP (a full bladder can raise your pressure) . Relax in a comfortable upright chair . Feet on the ground . Left arm resting comfortably on a flat surface at the level of your heart . Legs uncrossed . Back supported . Sit quietly and don't talk . Place the cuff on your bare arm . Adjust snuggly, so that only two fingertips can fit between your skin and the top of the cuff . Check 2 readings separated by at least one minute . Keep a log of your BP readings . For a visual, please reference this diagram: http://ccnc.care/bpdiagram  Provider Name: [facility]     Phone: [phone]  Zone 1: ALL CLEAR  Continue to monitor your symptoms:  . BP reading is less than 140 (top number) or less than 90 (bottom number)  . No right upper stomach pain . No headaches or  seeing spots . No feeling nauseated or throwing up . No swelling in face and hands  Zone 2: CAUTION Call your doctor's office for any of the following:  . BP reading is greater than 140 (top number) or greater than 90 (bottom number)  . Stomach pain under your ribs in the middle or right side . Headaches or seeing spots . Feeling nauseated or throwing up . Swelling in face and hands  Zone 3: EMERGENCY  Seek immediate medical care if you have any of the following:  . BP reading is greater than160 (top number) or greater than 110 (bottom number) . Severe headaches not improving with Tylenol . Serious difficulty catching your breath . Any worsening symptoms from Zone 2    First Trimester of  Pregnancy The first trimester of pregnancy is from week 1 until the end of week 12 (months 1 through 3). A week after a sperm fertilizes an egg, the egg will implant on the wall of the uterus. This embryo will begin to develop into a baby. Genes from you and your partner are forming the baby. The female genes determine whether the baby is a boy or a girl. At 6-8 weeks, the eyes and face are formed, and the heartbeat can be seen on ultrasound. At the end of 12 weeks, all the baby's organs are formed.  Now that you are pregnant, you will want to do everything you can to have a healthy baby. Two of the most important things are to get good prenatal care and to follow your health care provider's instructions. Prenatal care is all the medical care you receive before the baby's birth. This care will help prevent, find, and treat any problems during the pregnancy and childbirth. BODY CHANGES Your body goes through many changes during pregnancy. The changes vary from woman to woman.   You may gain or lose a couple of pounds at first.  You may feel sick to your stomach (nauseous) and throw up (vomit). If the vomiting is uncontrollable, call your health care provider.  You may tire easily.  You may develop headaches that can be relieved by medicines approved by your health care provider.  You may urinate more often. Painful urination may mean you have a bladder infection.  You may develop heartburn as a result of your pregnancy.  You may develop constipation because certain hormones are causing the muscles that push waste through your intestines to slow down.  You may develop hemorrhoids or swollen, bulging veins (varicose veins).  Your breasts may begin to grow larger and become tender. Your nipples may stick out more, and the tissue that surrounds them (areola) may become darker.  Your gums may bleed and may be sensitive to brushing and flossing.  Dark spots or blotches (chloasma, mask of pregnancy)  may develop on your face. This will likely fade after the baby is born.  Your menstrual periods will stop.  You may have a loss of appetite.  You may develop cravings for certain kinds of food.  You may have changes in your emotions from day to day, such as being excited to be pregnant or being concerned that something may go wrong with the pregnancy and baby.  You may have more vivid and strange dreams.  You may have changes in your hair. These can include thickening of your hair, rapid growth, and changes in texture. Some women also have hair loss during or after pregnancy, or hair that feels dry or thin. Your  hair will most likely return to normal after your baby is born. WHAT TO EXPECT AT YOUR PRENATAL VISITS During a routine prenatal visit:  You will be weighed to make sure you and the baby are growing normally.  Your blood pressure will be taken.  Your abdomen will be measured to track your baby's growth.  The fetal heartbeat will be listened to starting around week 10 or 12 of your pregnancy.  Test results from any previous visits will be discussed. Your health care provider may ask you:  How you are feeling.  If you are feeling the baby move.  If you have had any abnormal symptoms, such as leaking fluid, bleeding, severe headaches, or abdominal cramping.  If you have any questions. Other tests that may be performed during your first trimester include:  Blood tests to find your blood type and to check for the presence of any previous infections. They will also be used to check for low iron levels (anemia) and Rh antibodies. Later in the pregnancy, blood tests for diabetes will be done along with other tests if problems develop.  Urine tests to check for infections, diabetes, or protein in the urine.  An ultrasound to confirm the proper growth and development of the baby.  An amniocentesis to check for possible genetic problems.  Fetal screens for spina bifida and  Down syndrome.  You may need other tests to make sure you and the baby are doing well. HOME CARE INSTRUCTIONS  Medicines  Follow your health care provider's instructions regarding medicine use. Specific medicines may be either safe or unsafe to take during pregnancy.  Take your prenatal vitamins as directed.  If you develop constipation, try taking a stool softener if your health care provider approves. Diet  Eat regular, well-balanced meals. Choose a variety of foods, such as meat or vegetable-based protein, fish, milk and low-fat dairy products, vegetables, fruits, and whole grain breads and cereals. Your health care provider will help you determine the amount of weight gain that is right for you.  Avoid raw meat and uncooked cheese. These carry germs that can cause birth defects in the baby.  Eating four or five small meals rather than three large meals a day may help relieve nausea and vomiting. If you start to feel nauseous, eating a few soda crackers can be helpful. Drinking liquids between meals instead of during meals also seems to help nausea and vomiting.  If you develop constipation, eat more high-fiber foods, such as fresh vegetables or fruit and whole grains. Drink enough fluids to keep your urine clear or pale yellow. Activity and Exercise  Exercise only as directed by your health care provider. Exercising will help you:  Control your weight.  Stay in shape.  Be prepared for labor and delivery.  Experiencing pain or cramping in the lower abdomen or low back is a good sign that you should stop exercising. Check with your health care provider before continuing normal exercises.  Try to avoid standing for long periods of time. Move your legs often if you must stand in one place for a long time.  Avoid heavy lifting.  Wear low-heeled shoes, and practice good posture.  You may continue to have sex unless your health care provider directs you otherwise. Relief of Pain  or Discomfort  Wear a good support bra for breast tenderness.    Take warm sitz baths to soothe any pain or discomfort caused by hemorrhoids. Use hemorrhoid cream if your health care provider  approves.    Rest with your legs elevated if you have leg cramps or low back pain.  If you develop varicose veins in your legs, wear support hose. Elevate your feet for 15 minutes, 3-4 times a day. Limit salt in your diet. Prenatal Care  Schedule your prenatal visits by the twelfth week of pregnancy. They are usually scheduled monthly at first, then more often in the last 2 months before delivery.  Write down your questions. Take them to your prenatal visits.  Keep all your prenatal visits as directed by your health care provider. Safety  Wear your seat belt at all times when driving.  Make a list of emergency phone numbers, including numbers for family, friends, the hospital, and police and fire departments. General Tips  Ask your health care provider for a referral to a local prenatal education class. Begin classes no later than at the beginning of month 6 of your pregnancy.  Ask for help if you have counseling or nutritional needs during pregnancy. Your health care provider can offer advice or refer you to specialists for help with various needs.  Do not use hot tubs, steam rooms, or saunas.  Do not douche or use tampons or scented sanitary pads.  Do not cross your legs for long periods of time.  Avoid cat litter boxes and soil used by cats. These carry germs that can cause birth defects in the baby and possibly loss of the fetus by miscarriage or stillbirth.  Avoid all smoking, herbs, alcohol, and medicines not prescribed by your health care provider. Chemicals in these affect the formation and growth of the baby.  Schedule a dentist appointment. At home, brush your teeth with a soft toothbrush and be gentle when you floss. SEEK MEDICAL CARE IF:   You have dizziness.  You have mild  pelvic cramps, pelvic pressure, or nagging pain in the abdominal area.  You have persistent nausea, vomiting, or diarrhea.  You have a bad smelling vaginal discharge.  You have pain with urination.  You notice increased swelling in your face, hands, legs, or ankles. SEEK IMMEDIATE MEDICAL CARE IF:   You have a fever.  You are leaking fluid from your vagina.  You have spotting or bleeding from your vagina.  You have severe abdominal cramping or pain.  You have rapid weight gain or loss.  You vomit blood or material that looks like coffee grounds.  You are exposed to [place] measles and have never had them.  You are exposed to fifth disease or chickenpox.  You develop a severe headache.  You have shortness of breath.  You have any kind of trauma, such as from a fall or a car accident. Document Released: 06/25/2001 Document Revised: 11/15/2013 Document Reviewed: 05/11/2013 [facility] Patient Information 2015 [place], [place]. This information is not intended to replace advice given to you by your health care provider. Make sure you discuss any questions you have with your health care provider.

## 2019-11-02 NOTE — Progress Notes (Signed)
[place] 12+6 wks,measurements c/w dates,crl 69.27 mm,normal ovaries,anterior placenta,early previa,fhr 160 bpm,NB present,NT 1.5 mm

## 2019-11-02 NOTE — Progress Notes (Signed)
INITIAL OBSTETRICAL VISIT Patient name: Maria Brooks MRN [id number]  Date of birth: 1990-07-19 Chief Complaint:   Initial Prenatal Visit (nausea)  History of Present Illness:   Maria Brooks is a 29 y.o. G2P1001 Caucasian female at [redacted]w[redacted]d by LMP c/w 8wk u/s, with an Estimated Date of Delivery: 05/10/20 being seen today for her initial obstetrical visit.   Her obstetrical history is significant for term uncomplicated svb x 1.   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  Depression screen [facility] 2/9 11/02/2019 04/30/2018  Decreased Interest 0 1  Down, Depressed, Hopeless 0 0  PHQ - 2 Score 0 1  Altered sleeping 0 1  Tired, decreased energy 1 1  Change in appetite 0 0  Feeling bad or failure about yourself  0 0  Trouble concentrating 0 0  Moving slowly or fidgety/restless 0 0  Suicidal thoughts 0 0  PHQ-9 Score 1 3  Difficult doing work/chores Not difficult at all -    Patient's last menstrual period was 08/04/2019. Last pap 04/30/18. Results were: normal Review of Systems:   Pertinent items are noted in HPI Denies cramping/contractions, leakage of fluid, vaginal bleeding, abnormal vaginal discharge w/ itching/odor/irritation, headaches, visual changes, shortness of breath, chest pain, abdominal pain, severe nausea/vomiting, or problems with urination or bowel movements unless otherwise stated above.  Pertinent History Reviewed:  Reviewed past medical,surgical, social, obstetrical and family history.  Reviewed problem list, medications and allergies. OB History  Gravida Para Term Preterm AB Living  2 1 1  0 0 1  SAB TAB Ectopic Multiple Live Births  0 0 0 0 1    # Outcome Date GA Lbr Len/2nd Weight Sex Delivery Anes PTL Lv  2 Current           1 Term 11/09/18 [redacted]w[redacted]d 28:34 / 01:12 8 lb 6.6 oz (3.816 kg) F Vag-Spont EPI, Local N LIV     Birth Comments: WNL   Physical  Assessment:   Vitals:   11/02/19 1118  Weight: 218 lb (98.9 kg)  Body mass index is 39.87 kg/m.       Physical Examination:  General appearance - well appearing, and in no distress  Mental status - alert, oriented to person, place, and time  Psych:  She has a normal mood and affect  Skin - warm and dry, normal color, no suspicious lesions noted  Chest - effort normal, all lung fields clear to auscultation bilaterally  Heart - normal rate and regular rhythm  Abdomen - soft, nontender  Extremities:  No swelling or varicosities noted  Thin prep pap is not done   TODAY'S NT [redacted]w[redacted]d 12+6 wks,measurements c/w dates,crl 69.27 mm,normal ovaries,anterior placenta,early previa,fhr 160 bpm,NB present,NT 1.5 mm  Results for orders placed or performed in visit on 11/02/19 (from the past 24 hour(s))  POC Urinalysis Dipstick OB   Collection Time: 11/02/19 11:55 AM  Result Value Ref Range   Color, UA     Clarity, UA     Glucose, UA Negative Negative   Bilirubin, UA     Ketones, UA large    Spec Grav, UA     Blood, UA neg    pH, UA     POC,PROTEIN,UA Negative Negative, Trace, Small (1+), Moderate (2+), Large (3+), 4+   Urobilinogen, UA     Nitrite, UA neg    Leukocytes, UA Negative Negative   Appearance  Odor      Assessment & Plan:  1) Low-Risk Pregnancy G2P1001 at [redacted]w[redacted]d with an Estimated Date of Delivery: 05/10/20   2) Initial OB visit  3) H/O central venous sinus thrombosis> on COCs w/ neg work-up, rx Lovenox 80mg  daily  4) Early previa> w/o bleeding, pelvic rest for now, repeat @ 18wks  5) Nausea> declines meds, gave printed prevention/relief measures   Meds:  Meds ordered this encounter  Medications  . enoxaparin (LOVENOX) 80 MG/0.8ML injection    Sig: Inject 0.8 mLs (80 mg total) into the skin daily.    Dispense:  0.8 mL    Refill:  6    Dispense 30 syringes q month    Order Specific Question:   Supervising Provider    Answer:   Tania Ade H [[id number]]    Initial labs  obtained Continue prenatal vitamins Reviewed n/v relief measures and warning s/s to report Reviewed recommended weight gain based on pre-gravid BMI Encouraged well-balanced diet Genetic & carrier screening discussed: requests Panorama, NT/IT and Horizon 14  Ultrasound discussed; fetal survey: requested CCNC completed> form faxed if has or is planning to apply for medicaid The nature of [employer] for [employer] with multiple MDs and other Advanced Practice Providers was explained to patient; also emphasized that fellows, residents, and students are part of our team. Does not have home bp cuff. Will get one. Check bp weekly, let us know if >140/90.   Follow-up: Return in about 3 weeks (around 11/23/2019) for [place], in person, CNM.   Orders Placed This Encounter  Procedures  . GC/Chlamydia Probe Amp  . Urine Culture  . Integrated 1  . Hgb Fractionation Cascade  . Hepatitis C antibody  . Obstetric Panel, Including HIV  . Pain Management Screening Profile (10S)  . Hemoglobin A1c  . POC Urinalysis Dipstick OB    Roma Schanz CNM, [facility] 11/02/2019 12:21 PM

## 2019-11-03 LAB — PMP SCREEN PROFILE (10S), URINE
Amphetamine Scrn, Ur: NEGATIVE ng/mL
BARBITURATE SCREEN URINE: NEGATIVE ng/mL
BENZODIAZEPINE SCREEN, URINE: NEGATIVE ng/mL
CANNABINOIDS UR QL SCN: NEGATIVE ng/mL
Cocaine (Metab) Scrn, Ur: NEGATIVE ng/mL
Creatinine(Crt), U: 255.2 mg/dL (ref 20.0–300.0)
Methadone Screen, Urine: NEGATIVE ng/mL
OXYCODONE+OXYMORPHONE UR QL SCN: NEGATIVE ng/mL
Opiate Scrn, Ur: NEGATIVE ng/mL
Ph of Urine: 6 (ref 4.5–8.9)
Phencyclidine Qn, Ur: NEGATIVE ng/mL
Propoxyphene Scrn, Ur: NEGATIVE ng/mL

## 2019-11-04 LAB — INTEGRATED 1
Crown Rump Length: 69.3 mm
Gest. Age on Collection Date: 13 weeks
Maternal Age at EDD: 29.2 yr
Nuchal Translucency (NT): 1.5 mm
Number of Fetuses: 1
PAPP-A Value: 1084.5 ng/mL
Weight: 218 [lb_av]

## 2019-11-04 LAB — OBSTETRIC PANEL, INCLUDING HIV
Antibody Screen: NEGATIVE
Basophils Absolute: 0 10*3/uL (ref 0.0–0.2)
Basos: 0 %
EOS (ABSOLUTE): 0 10*3/uL (ref 0.0–0.4)
Eos: 0 %
HIV Screen 4th Generation wRfx: NONREACTIVE
Hematocrit: 44.1 % (ref 34.0–46.6)
Hemoglobin: 13.9 g/dL (ref 11.1–15.9)
Hepatitis B Surface Ag: NEGATIVE
Immature Grans (Abs): 0 10*3/uL (ref 0.0–0.1)
Immature Granulocytes: 0 %
Lymphocytes Absolute: 1.5 10*3/uL (ref 0.7–3.1)
Lymphs: 16 %
MCH: 25.4 pg — ABNORMAL LOW (ref 26.6–33.0)
MCHC: 31.5 g/dL (ref 31.5–35.7)
MCV: 81 fL (ref 79–97)
Monocytes Absolute: 0.6 10*3/uL (ref 0.1–0.9)
Monocytes: 7 %
Neutrophils Absolute: 7 10*3/uL (ref 1.4–7.0)
Neutrophils: 77 %
Platelets: 211 10*3/uL (ref 150–450)
RBC: 5.48 x10E6/uL — ABNORMAL HIGH (ref 3.77–5.28)
RDW: 15.8 % — ABNORMAL HIGH (ref 11.7–15.4)
RPR Ser Ql: NONREACTIVE
Rh Factor: POSITIVE
Rubella Antibodies, IGG: 1.95 index (ref >0.99)
WBC: 9.1 10*3/uL (ref 3.4–10.8)

## 2019-11-04 LAB — HGB FRACTIONATION CASCADE
Hgb A2: 2.5 % (ref 1.8–3.2)
Hgb A: 97.5 % (ref 96.4–98.8)
Hgb F: 0 % (ref 0.0–2.0)
Hgb S: 0 %

## 2019-11-04 LAB — URINE CULTURE

## 2019-11-04 LAB — GC/CHLAMYDIA PROBE AMP
Chlamydia trachomatis, NAA: NEGATIVE
Neisseria Gonorrhoeae by PCR: NEGATIVE

## 2019-11-04 LAB — HEPATITIS C ANTIBODY: Hep C Virus Ab: <0.1 s/co ratio (ref 0.0–0.9)

## 2019-11-04 LAB — HEMOGLOBIN A1C
Est. average glucose Bld gHb Est-mCnc: 103 mg/dL
Hgb A1c MFr Bld: 5.2 % (ref 4.8–5.6)

## 2019-11-24 ENCOUNTER — Ambulatory Visit (INDEPENDENT_AMBULATORY_CARE_PROVIDER_SITE_OTHER): Payer: BC Managed Care – PPO | Admitting: Advanced Practice Midwife

## 2019-11-24 VITALS — BP 117/79 | HR 87 | Wt 215.0 lb

## 2019-11-24 DIAGNOSIS — Z1379 Encounter for other screening for genetic and chromosomal anomalies: Secondary | ICD-10-CM

## 2019-11-24 DIAGNOSIS — Z3480 Encounter for supervision of other normal pregnancy, unspecified trimester: Secondary | ICD-10-CM

## 2019-11-24 NOTE — Addendum Note (Signed)
Addended by: Moss Mc on: 11/24/2019 12:19 PM   Modules accepted: Orders

## 2019-11-24 NOTE — Progress Notes (Signed)
   LOW-RISK PREGNANCY VISIT Patient name: Maria Brooks MRN [id number]  Date of birth: 10-Mar-1991 Chief Complaint:   Routine Prenatal Visit  History of Present Illness:   Maria Brooks is a 29 y.o. G58P1001 female at [redacted]w[redacted]d with an Estimated Date of Delivery: 05/10/20 being seen today for ongoing management of a low-risk pregnancy.  Today she reports some nausea, but mild. Contractions: Not present. Vag. Bleeding: None.  Movement: Absent. denies leaking of fluid. Review of Systems:   Pertinent items are noted in HPI Denies abnormal vaginal discharge w/ itching/odor/irritation, headaches, visual changes, shortness of breath, chest pain, abdominal pain, severe nausea/vomiting, or problems with urination or bowel movements unless otherwise stated above. Pertinent History Reviewed:  Reviewed past medical,surgical, social, obstetrical and family history.  Reviewed problem list, medications and allergies. Physical Assessment:   Vitals:   11/24/19 1108  BP: 117/79  Pulse: 87  Weight: 215 lb (97.5 kg)  Body mass index is 39.32 kg/m.        Physical Examination:   General appearance: Well appearing, and in no distress  Mental status: Alert, oriented to person, place, and time  Skin: Warm & dry  Cardiovascular: Normal heart rate noted  Respiratory: Normal respiratory effort, no distress  Abdomen: Soft, gravid, nontender  Pelvic: Cervical exam deferred         Extremities: Edema: None  Fetal Status: Fetal Heart Rate (bpm): 150   Movement: Absent    No results found for this or any previous visit (from the past 24 hour(s)).  Assessment & Plan:  1) Low-risk pregnancy G2P1001 at [redacted]w[redacted]d with an Estimated Date of Delivery: 05/10/20   2) Hx central venous sinus thrombosis, doing well on daily Lovenox 80mg    3) Previa, will reeval at anatomy u/s in 2-3wks   Meds: No orders of the defined types were placed in this encounter.  Labs/procedures today: 2nd IT; Panorama (was too dehydrated to  draw at NOB visit)  Plan:  Continue routine obstetrical care   Reviewed: Preterm labor symptoms and general obstetric precautions including but not limited to vaginal bleeding, contractions, leaking of fluid and fetal movement were reviewed in detail with the patient.  All questions were answered. Has home bp cuff. Check bp weekly, let know if >140/90.   Follow-up: Return for 2-3wks; , LROB, [place]: Anatomy, in person.  Orders Placed This Encounter  Procedures  . US OB Comp + 14 Wk  . INTEGRATED 2   [place] CNM 11/24/2019 11:40 AM

## 2019-11-26 LAB — INTEGRATED 2
AFP MoM: 1.17
Alpha-Fetoprotein: 28.8 ng/mL
Crown Rump Length: 69.3 mm
DIA MoM: 1.89
DIA Value: 239.1 pg/mL
Estriol, Unconjugated: 1.33 ng/mL
Gest. Age on Collection Date: 13 weeks
Gestational Age: 16.1 weeks
Maternal Age at EDD: 29.2 yr
Nuchal Translucency (NT): 1.5 mm
Nuchal Translucency MoM: 0.96
Number of Fetuses: 1
PAPP-A MoM: 1.43
PAPP-A Value: 1084.5 ng/mL
Test Results:: NEGATIVE
Weight: 218 [lb_av]
Weight: 218 [lb_av]
hCG MoM: 2.39
hCG Value: 63.4 IU/mL
uE3 MoM: 1.55

## 2019-12-07 ENCOUNTER — Other Ambulatory Visit: Payer: Self-pay | Admitting: Advanced Practice Midwife

## 2019-12-07 DIAGNOSIS — Z3480 Encounter for supervision of other normal pregnancy, unspecified trimester: Secondary | ICD-10-CM

## 2019-12-07 DIAGNOSIS — Z363 Encounter for antenatal screening for malformations: Secondary | ICD-10-CM

## 2019-12-08 ENCOUNTER — Other Ambulatory Visit: Payer: Self-pay

## 2019-12-08 ENCOUNTER — Ambulatory Visit (INDEPENDENT_AMBULATORY_CARE_PROVIDER_SITE_OTHER): Payer: BC Managed Care – PPO | Admitting: Advanced Practice Midwife

## 2019-12-08 ENCOUNTER — Ambulatory Visit (INDEPENDENT_AMBULATORY_CARE_PROVIDER_SITE_OTHER): Payer: BC Managed Care – PPO

## 2019-12-08 VITALS — BP 107/73 | HR 86 | Wt 212.0 lb

## 2019-12-08 DIAGNOSIS — Z3A18 18 weeks gestation of pregnancy: Secondary | ICD-10-CM

## 2019-12-08 DIAGNOSIS — O4440 Low lying placenta NOS or without hemorrhage, unspecified trimester: Secondary | ICD-10-CM | POA: Insufficient documentation

## 2019-12-08 DIAGNOSIS — Z3480 Encounter for supervision of other normal pregnancy, unspecified trimester: Secondary | ICD-10-CM

## 2019-12-08 DIAGNOSIS — O43199 Other malformation of placenta, unspecified trimester: Secondary | ICD-10-CM | POA: Insufficient documentation

## 2019-12-08 DIAGNOSIS — Z363 Encounter for antenatal screening for malformations: Secondary | ICD-10-CM

## 2019-12-08 DIAGNOSIS — Z3482 Encounter for supervision of other normal pregnancy, second trimester: Secondary | ICD-10-CM | POA: Diagnosis not present

## 2019-12-08 DIAGNOSIS — Z1389 Encounter for screening for other disorder: Secondary | ICD-10-CM

## 2019-12-08 DIAGNOSIS — Z1379 Encounter for other screening for genetic and chromosomal anomalies: Secondary | ICD-10-CM

## 2019-12-08 DIAGNOSIS — O4442 Low lying placenta NOS or without hemorrhage, second trimester: Secondary | ICD-10-CM

## 2019-12-08 DIAGNOSIS — Z331 Pregnant state, incidental: Secondary | ICD-10-CM

## 2019-12-08 LAB — POCT URINALYSIS DIPSTICK OB
Blood, UA: NEGATIVE
Glucose, UA: NEGATIVE
Ketones, UA: NEGATIVE
Nitrite, UA: NEGATIVE
POC,PROTEIN,UA: NEGATIVE

## 2019-12-08 MED ORDER — ENOXAPARIN SODIUM 80 MG/0.8ML ~~LOC~~ SOLN: 80.0000 mg | SUBCUTANEOUS | 6 refills | Status: DC

## 2019-12-08 NOTE — Progress Notes (Signed)
[place] 18 wks,cephalic,anterior low lying placenta gr 0,tip of placenta to cx 1.7 cm,cervical length 4.3 cm,marginal placental cord insertion,SVP of fluid 5.3 cm,normal ovaries,fhr 144 bpm,EFW 236 g 67%,anatomy complete

## 2019-12-08 NOTE — Patient Instructions (Signed)
Maria Brooks, I greatly value your feedback.  If you receive a survey following your visit with [place] today, we appreciate you taking the time to fill it out.  Thanks, Philipp Deputy, CNM  [facility] ([address] [place], [place] [address]) Entrance C, located off of [address] valet parking  Go to [email or web address] to register for FREE online childbirth classes  [place] Pediatricians/Family Doctors:  [facility] [phone]            [facility] [phone]                 [facility] [phone] (usually not accepting new patients unless you have family there already, you are always welcome to call and ask)       [facility] Department [phone]       [facility] Pediatricians/Family Doctors:   [facility]: [phone]  [facility]: [phone]  [facility]: [phone]  [facility] Doctors:   [facility]: [phone]   [facility]: [phone]  [facility] Doctors:  [facility]: [phone]    Home Blood Pressure Monitoring for Patients   Your provider has recommended that you check your blood pressure (BP) at least once a week at home. If you do not have a blood pressure cuff at home, one will be provided for you. Contact your provider if you have not received your monitor within 1 week.   Helpful Tips for Accurate Home Blood Pressure Checks   Don't smoke, exercise, or drink caffeine 30 minutes before checking your BP  Use the restroom before checking your BP (a full bladder can raise your pressure)  Relax in a comfortable upright chair  Feet on the ground  Left arm resting comfortably on a flat surface at the level of your heart  Legs uncrossed  Back supported  Sit quietly and don't talk  Place the cuff on your bare arm  Adjust snuggly, so that only two  fingertips can fit between your skin and the top of the cuff  Check 2 readings separated by at least one minute  Keep a log of your BP readings  For a visual, please reference this diagram: http://ccnc.care/bpdiagram  Provider Name: [facility]     Phone: [phone]  Zone 1: ALL CLEAR  Continue to monitor your symptoms:   BP reading is less than 140 (top number) or less than 90 (bottom number)   No right upper stomach pain  No headaches or seeing spots  No feeling nauseated or throwing up  No swelling in face and hands  Zone 2: CAUTION Call your doctor's office for any of the following:   BP reading is greater than 140 (top number) or greater than 90 (bottom number)   Stomach pain under your ribs in the middle or right side  Headaches or seeing spots  Feeling nauseated or throwing up  Swelling in face and hands  Zone 3: EMERGENCY  Seek immediate medical care if you have any of the following:   BP reading is greater than160 (top number) or greater than 110 (bottom number)  Severe headaches not improving with Tylenol  Serious difficulty catching your breath  Any worsening symptoms from Zone 2     Second Trimester of Pregnancy The second trimester is from week 14 through week 27 (months 4 through 6). The second trimester is often a time when you feel your best.  Your body has adjusted to being pregnant, and you begin to feel better physically. Usually, morning sickness has lessened or quit completely, you may have more energy, and you may have an increase in appetite. The second trimester is also a time when the fetus is growing rapidly. At the end of the sixth month, the fetus is about 9 inches long and weighs about 1 pounds. You will likely begin to feel the baby move (quickening) between 16 and 20 weeks of pregnancy. Body changes during your second trimester Your body continues to go through many changes during your second trimester. The changes vary from  woman to woman.  Your weight will continue to increase. You will notice your lower abdomen bulging out.  You may begin to get stretch marks on your hips, abdomen, and breasts.  You may develop headaches that can be relieved by medicines. The medicines should be approved by your health care provider.  You may urinate more often because the fetus is pressing on your bladder.  You may develop or continue to have heartburn as a result of your pregnancy.  You may develop constipation because certain hormones are causing the muscles that push waste through your intestines to slow down.  You may develop hemorrhoids or swollen, bulging veins (varicose veins).  You may have back pain. This is caused by: ? Weight gain. ? Pregnancy hormones that are relaxing the joints in your pelvis. ? A shift in weight and the muscles that support your balance.  Your breasts will continue to grow and they will continue to become tender.  Your gums may bleed and may be sensitive to brushing and flossing.  Dark spots or blotches (chloasma, mask of pregnancy) may develop on your face. This will likely fade after the baby is born.  A dark line from your belly button to the pubic area (linea nigra) may appear. This will likely fade after the baby is born.  You may have changes in your hair. These can include thickening of your hair, rapid growth, and changes in texture. Some women also have hair loss during or after pregnancy, or hair that feels dry or thin. Your hair will most likely return to normal after your baby is born.  What to expect at prenatal visits During a routine prenatal visit:  You will be weighed to make sure you and the fetus are growing normally.  Your blood pressure will be taken.  Your abdomen will be measured to track your baby's growth.  The fetal heartbeat will be listened to.  Any test results from the previous visit will be discussed.  Your health care provider may ask  you:  How you are feeling.  If you are feeling the baby move.  If you have had any abnormal symptoms, such as leaking fluid, bleeding, severe headaches, or abdominal cramping.  If you are using any tobacco products, including cigarettes, chewing tobacco, and electronic cigarettes.  If you have any questions.  Other tests that may be performed during your second trimester include:  Blood tests that check for: ? Low iron levels (anemia). ? High blood sugar that affects pregnant women (gestational diabetes) between 26 and 28 weeks. ? Rh antibodies. This is to check for a protein on red blood cells (Rh factor).  Urine tests to check for infections, diabetes, or protein in the urine.  An ultrasound to confirm the proper growth and development of the baby.  An amniocentesis to check for possible genetic problems.  Fetal screens  for spina bifida and Down syndrome.  HIV (human immunodeficiency virus) testing. Routine prenatal testing includes screening for HIV, unless you choose not to have this test.  Follow these instructions at home: Medicines  Follow your health care provider's instructions regarding medicine use. Specific medicines may be either safe or unsafe to take during pregnancy.  Take a prenatal vitamin that contains at least 600 micrograms (mcg) of folic acid.  If you develop constipation, try taking a stool softener if your health care provider approves. Eating and drinking  Eat a balanced diet that includes fresh fruits and vegetables, whole grains, good sources of protein such as meat, eggs, or tofu, and low-fat dairy. Your health care provider will help you determine the amount of weight gain that is right for you.  Avoid raw meat and uncooked cheese. These carry germs that can cause birth defects in the baby.  If you have low calcium intake from food, talk to your health care provider about whether you should take a daily calcium supplement.  Limit foods that  are high in fat and processed sugars, such as fried and sweet foods.  To prevent constipation: ? Drink enough fluid to keep your urine clear or pale yellow. ? Eat foods that are high in fiber, such as fresh fruits and vegetables, whole grains, and beans. Activity  Exercise only as directed by your health care provider. Most women can continue their usual exercise routine during pregnancy. Try to exercise for 30 minutes at least 5 days a week. Stop exercising if you experience uterine contractions.  Avoid heavy lifting, wear low heel shoes, and practice good posture.  A sexual relationship may be continued unless your health care provider directs you otherwise. Relieving pain and discomfort  Wear a good support bra to prevent discomfort from breast tenderness.  Take warm sitz baths to soothe any pain or discomfort caused by hemorrhoids. Use hemorrhoid cream if your health care provider approves.  Rest with your legs elevated if you have leg cramps or low back pain.  If you develop varicose veins, wear support hose. Elevate your feet for 15 minutes, 3-4 times a day. Limit salt in your diet. Prenatal Care  Write down your questions. Take them to your prenatal visits.  Keep all your prenatal visits as told by your health care provider. This is important. Safety  Wear your seat belt at all times when driving.  Make a list of emergency phone numbers, including numbers for family, friends, the hospital, and police and fire departments. General instructions  Ask your health care provider for a referral to a local prenatal education class. Begin classes no later than the beginning of month 6 of your pregnancy.  Ask for help if you have counseling or nutritional needs during pregnancy. Your health care provider can offer advice or refer you to specialists for help with various needs.  Do not use hot tubs, steam rooms, or saunas.  Do not douche or use tampons or scented sanitary  pads.  Do not cross your legs for long periods of time.  Avoid cat litter boxes and soil used by cats. These carry germs that can cause birth defects in the baby and possibly loss of the fetus by miscarriage or stillbirth.  Avoid all smoking, herbs, alcohol, and unprescribed drugs. Chemicals in these products can affect the formation and growth of the baby.  Do not use any products that contain nicotine or tobacco, such as cigarettes and e-cigarettes. If you need help quitting,  ask your health care provider.  Visit your dentist if you have not gone yet during your pregnancy. Use a soft toothbrush to brush your teeth and be gentle when you floss. Contact a health care provider if:  You have dizziness.  You have mild pelvic cramps, pelvic pressure, or nagging pain in the abdominal area.  You have persistent nausea, vomiting, or diarrhea.  You have a bad smelling vaginal discharge.  You have pain when you urinate. Get help right away if:  You have a fever.  You are leaking fluid from your vagina.  You have spotting or bleeding from your vagina.  You have severe abdominal cramping or pain.  You have rapid weight gain or weight loss.  You have shortness of breath with chest pain.  You notice sudden or extreme swelling of your face, hands, ankles, feet, or legs.  You have not felt your baby move in over an hour.  You have severe headaches that do not go away when you take medicine.  You have vision changes. Summary  The second trimester is from week 14 through week 27 (months 4 through 6). It is also a time when the fetus is growing rapidly.  Your body goes through many changes during pregnancy. The changes vary from woman to woman.  Avoid all smoking, herbs, alcohol, and unprescribed drugs. These chemicals affect the formation and growth your baby.  Do not use any tobacco products, such as cigarettes, chewing tobacco, and e-cigarettes. If you need help quitting, ask your  health care provider.  Contact your health care provider if you have any questions. Keep all prenatal visits as told by your health care provider. This is important. This information is not intended to replace advice given to you by your health care provider. Make sure you discuss any questions you have with your health care provider. Document Released: 06/25/2001 Document Revised: 12/07/2015 Document Reviewed: 09/01/2012 Elsevier Interactive Patient Education  2017 [place] FLU! Because you are pregnant, we at [facility], along with the Centers for Disease Control (CDC), recommend that you receive the flu vaccine to protect yourself and your baby from the flu. The flu is more likely to cause severe illness in pregnant women than in women of reproductive age who are not pregnant. Changes in the immune system, heart, and lungs during pregnancy make pregnant women (and women up to two weeks postpartum) more prone to severe illness from flu, including illness resulting in hospitalization. Flu also may be harmful for a pregnant womans developing baby. A common flu symptom is fever, which may be associated with neural tube defects and other adverse outcomes for a developing baby. Getting vaccinated can also help protect a baby after birth from flu. (Mom passes antibodies onto the developing baby during her pregnancy.)  A Flu Vaccine is the Best Protection Against Flu Getting a flu vaccine is the first and most important step in protecting against flu. Pregnant women should get a flu shot and not the live attenuated influenza vaccine (LAIV), also known as nasal spray flu vaccine. Flu vaccines given during pregnancy help protect both the mother and her baby from flu. Vaccination has been shown to reduce the risk of flu-associated acute respiratory infection in pregnant women by up to one-half. A 2018 study showed that getting a flu shot reduced a pregnant womans  risk of being hospitalized with flu by an average of 40 percent. Pregnant women who get a flu  vaccine are also helping to protect their babies from flu illness for the first several months after their birth, when they are too young to get vaccinated.   A Long Record of Safety for Flu Shots in Pregnant Women Flu shots have been given to millions of pregnant women over many years with a good safety record. There is a lot of evidence that flu vaccines can be given safely during pregnancy; though these data are limited for the first trimester. The CDC recommends that pregnant women get vaccinated during any trimester of their pregnancy. It is very important for pregnant women to get the flu shot.   Other Preventive Actions In addition to getting a flu shot, pregnant women should take the same everyday preventive actions the CDC recommends of everyone, including covering coughs, washing hands often, and avoiding people who are sick.  Symptoms and Treatment If you get sick with flu symptoms call your doctor right away. There are antiviral drugs that can treat flu illness and prevent serious flu complications. The CDC recommends prompt treatment for people who have influenza infection or suspected influenza infection and who are at high risk of serious flu complications, such as people with asthma, diabetes (including gestational diabetes), or heart disease. Early treatment of influenza in hospitalized pregnant women has been shown to reduce the length of the hospital stay.  Symptoms Flu symptoms include fever, cough, sore throat, runny or stuffy nose, body aches, headache, chills and fatigue. Some people may also have vomiting and diarrhea. People may be infected with the flu and have respiratory symptoms without a fever.  Early Treatment is Important for Pregnant Women Treatment should begin as soon as possible because antiviral drugs work best when started early (within 48 hours after symptoms  start). Antiviral drugs can make your flu illness milder and make you feel better faster. They may also prevent serious health problems that can result from flu illness. Oral oseltamivir (Tamiflu) is the preferred treatment for pregnant women because it has the most studies available to suggest that it is safe and beneficial. Antiviral drugs require a prescription from your provider. Having a fever caused by flu infection or other infections early in pregnancy may be linked to birth defects in a baby. In addition to taking antiviral drugs, pregnant women who get a fever should treat their fever with Tylenol (acetaminophen) and contact their provider immediately.  When to Seek Emergency Medical Care If you are pregnant and have any of these signs, seek care immediately:  Difficulty breathing or shortness of breath  Pain or pressure in the chest or abdomen  Sudden dizziness  Confusion  Severe or persistent vomiting  High fever that is not responding to Tylenol (or store brand equivalent)  Decreased or no movement of your baby  [email or web address].[email or web address]

## 2019-12-08 NOTE — Progress Notes (Signed)
LOW-RISK PREGNANCY VISIT Patient name: Maria Brooks MRN [id number]  Date of birth: 09-Apr-1991 Chief Complaint:   Routine Prenatal Visit  History of Present Illness:   Maria Brooks is a 29 y.o. G25P1001 female at [redacted]w[redacted]d with an Estimated Date of Delivery: 05/10/20 being seen today for ongoing management of a low-risk pregnancy.  Today she reports no complaints. Contractions: Not present. Vag. Bleeding: None.  Movement: Absent. denies leaking of fluid. Review of Systems:   Pertinent items are noted in HPI Denies abnormal vaginal discharge w/ itching/odor/irritation, headaches, visual changes, shortness of breath, chest pain, abdominal pain, severe nausea/vomiting, or problems with urination or bowel movements unless otherwise stated above. Pertinent History Reviewed:  Reviewed past medical,surgical, social, obstetrical and family history.  Reviewed problem list, medications and allergies. Physical Assessment:   Vitals:   12/08/19 1416  BP: 107/73  Pulse: 86  Weight: 212 lb (96.2 kg)  Body mass index is 38.78 kg/m.        Physical Examination:   General appearance: Well appearing, and in no distress  Mental status: Alert, oriented to person, place, and time  Skin: Warm & dry  Cardiovascular: Normal heart rate noted  Respiratory: Normal respiratory effort, no distress  Abdomen: Soft, gravid, nontender  Pelvic: Cervical exam deferred         Extremities: Edema: None  Fetal Status: Fetal Heart Rate (bpm): 144 u/s   Movement: Absent     Anatomy u/s: [place] 18 wks,cephalic,anterior low lying placenta gr 0,tip of placenta to cx 1.7 cm,cervical length 4.3 cm,marginal placental cord insertion,SVP of fluid 5.3 cm,normal ovaries,fhr 144 bpm,EFW 236 g 67%,anatomy complete  Results for orders placed or performed in visit on 12/08/19 (from the past 24 hour(s))  POC Urinalysis Dipstick OB   Collection Time: 12/08/19  2:11 PM  Result Value Ref Range   Color, UA     Clarity, UA     Glucose,  UA Negative Negative   Bilirubin, UA     Ketones, UA neg    Spec Grav, UA     Blood, UA neg    pH, UA     POC,PROTEIN,UA Negative Negative, Trace, Small (1+), Moderate (2+), Large (3+), 4+   Urobilinogen, UA     Nitrite, UA neg    Leukocytes, UA Large (3+) (A) Negative   Appearance     Odor      Assessment & Plan:  1) Low-risk pregnancy G2P1001 at [redacted]w[redacted]d with an Estimated Date of Delivery: 05/10/20   2) Early previa now low lying placenta 1.7cm from os, continue on pelvic rest, will f/u at 24wks  3) Marginal cord insertion, check fetal growth with next scan  4) Hx CVST, continue on daily Lovenox 80mg  qd   Meds:  Meds ordered this encounter  Medications  . enoxaparin (LOVENOX) 80 MG/0.8ML injection    Sig: Inject 0.8 mLs (80 mg total) into the skin daily.    Dispense:  0.8 mL    Refill:  6    Dispense 30 syringes q month    Order Specific Question:   Supervising Provider    Answer:   Jonnie Kind [[id number]]   Labs/procedures today: anatomy u/s  Plan:  Continue routine obstetrical care   Reviewed: Preterm labor symptoms and general obstetric precautions including but not limited to vaginal bleeding, contractions, leaking of fluid and fetal movement were reviewed in detail with the patient.  All questions were answered. Has home bp cuff. Check bp weekly, let us  know if >140/90.   Follow-up: Return in about 4 weeks (around 01/05/2020) for LROB, in person.  Orders Placed This Encounter  Procedures  . POC Urinalysis Dipstick OB   Arabella Merles [facility] 12/08/2019 3:01 PM

## 2020-01-06 ENCOUNTER — Encounter: Payer: Self-pay | Admitting: Women's Health

## 2020-01-06 ENCOUNTER — Other Ambulatory Visit: Payer: Self-pay

## 2020-01-06 ENCOUNTER — Ambulatory Visit (INDEPENDENT_AMBULATORY_CARE_PROVIDER_SITE_OTHER): Payer: BC Managed Care – PPO | Admitting: Women's Health

## 2020-01-06 VITALS — BP 124/79 | HR 101 | Wt 212.0 lb

## 2020-01-06 DIAGNOSIS — O43192 Other malformation of placenta, second trimester: Secondary | ICD-10-CM

## 2020-01-06 DIAGNOSIS — Z331 Pregnant state, incidental: Secondary | ICD-10-CM

## 2020-01-06 DIAGNOSIS — O4442 Low lying placenta NOS or without hemorrhage, second trimester: Secondary | ICD-10-CM

## 2020-01-06 DIAGNOSIS — Z1389 Encounter for screening for other disorder: Secondary | ICD-10-CM

## 2020-01-06 DIAGNOSIS — O4440 Low lying placenta NOS or without hemorrhage, unspecified trimester: Secondary | ICD-10-CM

## 2020-01-06 DIAGNOSIS — Z7901 Long term (current) use of anticoagulants: Secondary | ICD-10-CM

## 2020-01-06 DIAGNOSIS — Z3482 Encounter for supervision of other normal pregnancy, second trimester: Secondary | ICD-10-CM

## 2020-01-06 DIAGNOSIS — Z86718 Personal history of other venous thrombosis and embolism: Secondary | ICD-10-CM

## 2020-01-06 DIAGNOSIS — Z3480 Encounter for supervision of other normal pregnancy, unspecified trimester: Secondary | ICD-10-CM

## 2020-01-06 DIAGNOSIS — Z3A22 22 weeks gestation of pregnancy: Secondary | ICD-10-CM

## 2020-01-06 DIAGNOSIS — O43199 Other malformation of placenta, unspecified trimester: Secondary | ICD-10-CM

## 2020-01-06 NOTE — Patient Instructions (Signed)
Maria Brooks, I greatly value your feedback.  If you receive a survey following your visit with [place] today, we appreciate you taking the time to fill it out.  Thanks, [name], CNM, WHNP-BC   You will have your sugar test next visit.  Please do not eat or drink anything after midnight the night before you come, not even water.  You will be here for at least two hours.  Please make an appointment online for the bloodwork at [email or web address] for 8:30am (or as close to this as possible). Make sure you select the [facility] service center. The day of the appointment, check in with our office first, then you will go to [facility] to start the sugar test.    [facility] [place], [place] [address]) Entrance C, located off of [address] valet parking  Go to [email or web address] to register for FREE online childbirth classes   Call the office [phone]) or go to [facility] if:  You begin to have strong, frequent contractions  Your water breaks.  Sometimes it is a big gush of fluid, sometimes it is just a trickle that keeps getting your panties wet or running down your legs  You have vaginal bleeding.  It is normal to have a small amount of spotting if your cervix was checked.   You don't feel your baby moving like normal.  If you don't, get you something to eat and drink and lay down and focus on feeling your baby move.   If your baby is still not moving like normal, you should call the office or go to [facility].  [place] Pediatricians/Family Doctors:  [facility] [phone]            [facility] [phone]                 [facility] [phone] (usually not accepting new patients unless you have family there already, you are always welcome to call and ask)       [facility] Department [phone]       [facility] Pediatricians/Family Doctors:   [facility]:  [phone]  [facility]: [phone]  [facility]: [phone]  [facility] Doctors:   [facility]: [phone]   [facility]: [phone]  [facility] Doctors:  [facility]: [phone]   Home Blood Pressure Monitoring for Patients   Your provider has recommended that you check your blood pressure (BP) at least once a week at home. If you do not have a blood pressure cuff at home, one will be provided for you. Contact your provider if you have not received your monitor within 1 week.   Helpful Tips for Accurate Home Blood Pressure Checks   Don't smoke, exercise, or drink caffeine 30 minutes before checking your BP  Use the restroom before checking your BP (a full bladder can raise your pressure)  Relax in a comfortable upright chair  Feet on the ground  Left arm resting comfortably on a flat surface at the level of your heart  Legs uncrossed  Back supported  Sit quietly and don't talk  Place the cuff on your bare arm  Adjust snuggly, so that only two fingertips can fit between your skin and the top of the cuff  Check 2 readings separated by at least one minute  Keep a log of your BP readings  For a visual, please  reference this diagram: http://ccnc.care/bpdiagram  Provider Name: [facility]     Phone: [phone]  Zone 1: ALL CLEAR  Continue to monitor your symptoms:   BP reading is less than 140 (top number) or less than 90 (bottom number)   No right upper stomach pain  No headaches or seeing spots  No feeling nauseated or throwing up  No swelling in face and hands  Zone 2: CAUTION Call your doctor's office for any of the following:   BP reading is greater than 140 (top number) or greater than 90 (bottom number)   Stomach pain under your ribs in the middle or right side  Headaches or seeing spots  Feeling nauseated or throwing up  Swelling in  face and hands  Zone 3: EMERGENCY  Seek immediate medical care if you have any of the following:   BP reading is greater than160 (top number) or greater than 110 (bottom number)  Severe headaches not improving with Tylenol  Serious difficulty catching your breath  Any worsening symptoms from Zone 2   Second Trimester of Pregnancy The second trimester is from week 13 through week 28, months 4 through 6. The second trimester is often a time when you feel your best. Your body has also adjusted to being pregnant, and you begin to feel better physically. Usually, morning sickness has lessened or quit completely, you may have more energy, and you may have an increase in appetite. The second trimester is also a time when the fetus is growing rapidly. At the end of the sixth month, the fetus is about 9 inches long and weighs about 1 pounds. You will likely begin to feel the baby move (quickening) between 18 and 20 weeks of the pregnancy. BODY CHANGES Your body goes through many changes during pregnancy. The changes vary from woman to woman.   Your weight will continue to increase. You will notice your lower abdomen bulging out.  You may begin to get stretch marks on your hips, abdomen, and breasts.  You may develop headaches that can be relieved by medicines approved by your health care provider.  You may urinate more often because the fetus is pressing on your bladder.  You may develop or continue to have heartburn as a result of your pregnancy.  You may develop constipation because certain hormones are causing the muscles that push waste through your intestines to slow down.  You may develop hemorrhoids or swollen, bulging veins (varicose veins).  You may have back pain because of the weight gain and pregnancy hormones relaxing your joints between the bones in your pelvis and as a result of a shift in weight and the muscles that support your balance.  Your breasts will continue to grow  and be tender.  Your gums may bleed and may be sensitive to brushing and flossing.  Dark spots or blotches (chloasma, mask of pregnancy) may develop on your face. This will likely fade after the baby is born.  A dark line from your belly button to the pubic area (linea nigra) may appear. This will likely fade after the baby is born.  You may have changes in your hair. These can include thickening of your hair, rapid growth, and changes in texture. Some women also have hair loss during or after pregnancy, or hair that feels dry or thin. Your hair will most likely return to normal after your baby is born. WHAT TO EXPECT AT YOUR PRENATAL VISITS During a routine prenatal visit:  You  will be weighed to make sure you and the fetus are growing normally.  Your blood pressure will be taken.  Your abdomen will be measured to track your baby's growth.  The fetal heartbeat will be listened to.  Any test results from the previous visit will be discussed. Your health care provider may ask you:  How you are feeling.  If you are feeling the baby move.  If you have had any abnormal symptoms, such as leaking fluid, bleeding, severe headaches, or abdominal cramping.  If you have any questions. Other tests that may be performed during your second trimester include:  Blood tests that check for:  Low iron levels (anemia).  Gestational diabetes (between 24 and 28 weeks).  Rh antibodies.  Urine tests to check for infections, diabetes, or protein in the urine.  An ultrasound to confirm the proper growth and development of the baby.  An amniocentesis to check for possible genetic problems.  Fetal screens for spina bifida and Down syndrome. HOME CARE INSTRUCTIONS   Avoid all smoking, herbs, alcohol, and unprescribed drugs. These chemicals affect the formation and growth of the baby.  Follow your health care provider's instructions regarding medicine use. There are medicines that are either  safe or unsafe to take during pregnancy.  Exercise only as directed by your health care provider. Experiencing uterine cramps is a good sign to stop exercising.  Continue to eat regular, healthy meals.  Wear a good support bra for breast tenderness.  Do not use hot tubs, steam rooms, or saunas.  Wear your seat belt at all times when driving.  Avoid raw meat, uncooked cheese, cat litter boxes, and soil used by cats. These carry germs that can cause birth defects in the baby.  Take your prenatal vitamins.  Try taking a stool softener (if your health care provider approves) if you develop constipation. Eat more high-fiber foods, such as fresh vegetables or fruit and whole grains. Drink plenty of fluids to keep your urine clear or pale yellow.  Take warm sitz baths to soothe any pain or discomfort caused by hemorrhoids. Use hemorrhoid cream if your health care provider approves.  If you develop varicose veins, wear support hose. Elevate your feet for 15 minutes, 3-4 times a day. Limit salt in your diet.  Avoid heavy lifting, wear low heel shoes, and practice good posture.  Rest with your legs elevated if you have leg cramps or low back pain.  Visit your dentist if you have not gone yet during your pregnancy. Use a soft toothbrush to brush your teeth and be gentle when you floss.  A sexual relationship may be continued unless your health care provider directs you otherwise.  Continue to go to all your prenatal visits as directed by your health care provider. SEEK MEDICAL CARE IF:   You have dizziness.  You have mild pelvic cramps, pelvic pressure, or nagging pain in the abdominal area.  You have persistent nausea, vomiting, or diarrhea.  You have a bad smelling vaginal discharge.  You have pain with urination. SEEK IMMEDIATE MEDICAL CARE IF:   You have a fever.  You are leaking fluid from your vagina.  You have spotting or bleeding from your vagina.  You have severe  abdominal cramping or pain.  You have rapid weight gain or loss.  You have shortness of breath with chest pain.  You notice sudden or extreme swelling of your face, hands, ankles, feet, or legs.  You have not felt your baby move  in over an hour.  You have severe headaches that do not go away with medicine.  You have vision changes. Document Released: 06/25/2001 Document Revised: 07/06/2013 Document Reviewed: 09/01/2012 [facility] Patient Information 2015 [place], [place]. This information is not intended to replace advice given to you by your health care provider. Make sure you discuss any questions you have with your health care provider.

## 2020-01-06 NOTE — Progress Notes (Signed)
   LOW-RISK PREGNANCY VISIT Patient name: Maria Brooks MRN [id number]  Date of birth: 1990/09/12 Chief Complaint:   Routine Prenatal Visit  History of Present Illness:   Maria Brooks is a 29 y.o. G8P1001 female at [redacted]w[redacted]d with an Estimated Date of Delivery: 05/10/20 being seen today for ongoing management of a low-risk pregnancy.  Depression screen [facility] 2/9 11/02/2019 04/30/2018  Decreased Interest 0 1  Down, Depressed, Hopeless 0 0  PHQ - 2 Score 0 1  Altered sleeping 0 1  Tired, decreased energy 1 1  Change in appetite 0 0  Feeling bad or failure about yourself  0 0  Trouble concentrating 0 0  Moving slowly or fidgety/restless 0 0  Suicidal thoughts 0 0  PHQ-9 Score 1 3  Difficult doing work/chores Not difficult at all -    Today she reports no complaints. Went to [employer] class last month. Contractions: Not present. Vag. Bleeding: None.  Movement: Present. denies leaking of fluid. Review of Systems:   Pertinent items are noted in HPI Denies abnormal vaginal discharge w/ itching/odor/irritation, headaches, visual changes, shortness of breath, chest pain, abdominal pain, severe nausea/vomiting, or problems with urination or bowel movements unless otherwise stated above. Pertinent History Reviewed:  Reviewed past medical,surgical, social, obstetrical and family history.  Reviewed problem list, medications and allergies. Physical Assessment:   Vitals:   01/06/20 1417  BP: 124/79  Pulse: (!) 101  Weight: 212 lb (96.2 kg)  Body mass index is 38.78 kg/m.        Physical Examination:   General appearance: Well appearing, and in no distress  Mental status: Alert, oriented to person, place, and time  Skin: Warm & dry  Cardiovascular: Normal heart rate noted  Respiratory: Normal respiratory effort, no distress  Abdomen: Soft, gravid, nontender  Pelvic: Cervical exam deferred         Extremities: Edema: None  Fetal Status: Fetal Heart Rate (bpm): 157 Fundal Height: 24 cm Movement:  Present    Chaperone: n/a    No results found for this or any previous visit (from the past 24 hour(s)).  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  Labs/procedures today: none  Plan:  Continue routine obstetrical care  Next visit: prefers in person    Reviewed: Preterm labor symptoms and general obstetric precautions including but not limited to vaginal bleeding, contractions, leaking of fluid and fetal movement were reviewed in detail with the patient.  All questions were answered.  Follow-up: Return in about 4 weeks (around 02/03/2020) for LROB, US:EFW, in person, MD or CNM.  Orders Placed This Encounter  Procedures  . 02/05/2020 OB Follow Up  . POC Urinalysis Dipstick OB   US CNM, [facility] 01/06/2020 2:42 PM

## 2020-02-01 ENCOUNTER — Encounter: Payer: Self-pay | Admitting: *Deleted

## 2020-02-01 DIAGNOSIS — Z3480 Encounter for supervision of other normal pregnancy, unspecified trimester: Secondary | ICD-10-CM

## 2020-02-03 ENCOUNTER — Other Ambulatory Visit: Payer: BC Managed Care – PPO

## 2020-02-03 ENCOUNTER — Ambulatory Visit (INDEPENDENT_AMBULATORY_CARE_PROVIDER_SITE_OTHER): Payer: BC Managed Care – PPO | Admitting: Women's Health

## 2020-02-03 ENCOUNTER — Ambulatory Visit (INDEPENDENT_AMBULATORY_CARE_PROVIDER_SITE_OTHER): Payer: BC Managed Care – PPO

## 2020-02-03 ENCOUNTER — Encounter: Payer: Self-pay | Admitting: Women's Health

## 2020-02-03 ENCOUNTER — Telehealth: Payer: Self-pay | Admitting: *Deleted

## 2020-02-03 VITALS — BP 121/79 | HR 108 | Wt 215.0 lb

## 2020-02-03 DIAGNOSIS — Z331 Pregnant state, incidental: Secondary | ICD-10-CM

## 2020-02-03 DIAGNOSIS — Z23 Encounter for immunization: Secondary | ICD-10-CM

## 2020-02-03 DIAGNOSIS — Z3480 Encounter for supervision of other normal pregnancy, unspecified trimester: Secondary | ICD-10-CM

## 2020-02-03 DIAGNOSIS — O0992 Supervision of high risk pregnancy, unspecified, second trimester: Secondary | ICD-10-CM

## 2020-02-03 DIAGNOSIS — O43199 Other malformation of placenta, unspecified trimester: Secondary | ICD-10-CM

## 2020-02-03 DIAGNOSIS — O4442 Low lying placenta NOS or without hemorrhage, second trimester: Secondary | ICD-10-CM | POA: Diagnosis not present

## 2020-02-03 DIAGNOSIS — Z3482 Encounter for supervision of other normal pregnancy, second trimester: Secondary | ICD-10-CM

## 2020-02-03 DIAGNOSIS — IMO0002 Reserved for concepts with insufficient information to code with codable children: Secondary | ICD-10-CM

## 2020-02-03 DIAGNOSIS — O3506X0 Maternal care for (suspected) central nervous system malformation or damage in fetus, hydrocephaly, not applicable or unspecified: Secondary | ICD-10-CM

## 2020-02-03 DIAGNOSIS — Z3A26 26 weeks gestation of pregnancy: Secondary | ICD-10-CM

## 2020-02-03 DIAGNOSIS — Z131 Encounter for screening for diabetes mellitus: Secondary | ICD-10-CM

## 2020-02-03 DIAGNOSIS — O4440 Low lying placenta NOS or without hemorrhage, unspecified trimester: Secondary | ICD-10-CM

## 2020-02-03 DIAGNOSIS — O350XX0 Maternal care for (suspected) central nervous system malformation in fetus, not applicable or unspecified: Secondary | ICD-10-CM

## 2020-02-03 DIAGNOSIS — Z1389 Encounter for screening for other disorder: Secondary | ICD-10-CM

## 2020-02-03 LAB — POCT URINALYSIS DIPSTICK OB
Blood, UA: NEGATIVE
Glucose, UA: NEGATIVE
Ketones, UA: NEGATIVE
Leukocytes, UA: NEGATIVE
Nitrite, UA: NEGATIVE
POC,PROTEIN,UA: NEGATIVE

## 2020-02-03 NOTE — Patient Instructions (Signed)
Maria Brooks, I greatly value your feedback.  If you receive a survey following your visit with [place] today, we appreciate you taking the time to fill it out.  Thanks, [name], CNM, WHNP-BC   [facility] ([address] [place], [place] [address]) Entrance C, located off of [address] valet parking  Go to [email or web address] to register for FREE online childbirth classes   Call the office [phone]) or go to [facility] if:  You begin to have strong, frequent contractions  Your water breaks.  Sometimes it is a big gush of fluid, sometimes it is just a trickle that keeps getting your panties wet or running down your legs  You have vaginal bleeding.  It is normal to have a small amount of spotting if your cervix was checked.   You don't feel your baby moving like normal.  If you don't, get you something to eat and drink and lay down and focus on feeling your baby move.  You should feel at least 10 movements in 2 hours.  If you don't, you should call the office or go to [facility].    Tdap Vaccine  It is recommended that you get the Tdap vaccine during the third trimester of EACH pregnancy to help protect your baby from getting pertussis (whooping cough)  27-36 weeks is the BEST time to do this so that you can pass the protection on to your baby. During pregnancy is better than after pregnancy, but if you are unable to get it during pregnancy it will be offered at the hospital.   You can get this vaccine with [place], at the health department, your family doctor, or some local pharmacies  Everyone who will be around your baby should also be up-to-date on their vaccines before the baby comes. Adults (who are not pregnant) only need 1 dose of Tdap during adulthood.   [place] Pediatricians/Family Doctors:  [facility] [phone]            [facility] [phone]                 [facility]  [phone] (usually not accepting new patients unless you have family there already, you are always welcome to call and ask)       [facility] Department [phone]       [facility] Pediatricians/Family Doctors:   [facility]: [phone]  [facility]: [phone]  [facility]: [phone]  [facility] Doctors:   [facility]: [phone]   [facility]: [phone]  [facility] Doctors:  [facility]: [phone]   Home Blood Pressure Monitoring for Patients   Your provider has recommended that you check your blood pressure (BP) at least once a week at home. If you do not have a blood pressure cuff at home, one will be provided for you. Contact your provider if you have not received your monitor within 1 week.   Helpful Tips for Accurate Home Blood Pressure Checks  . Don't smoke, exercise, or drink caffeine 30 minutes before checking your BP . Use the restroom before checking your BP (a full bladder can raise your pressure) . Relax in a comfortable upright chair . Feet on the ground . Left arm resting comfortably on a flat surface at the level of your heart . Legs uncrossed . Back supported . Sit quietly and don't talk . Place the cuff on your  bare arm . Adjust snuggly, so that only two fingertips can fit between your skin and the top of the cuff . Check 2 readings separated by at least one minute . Keep a log of your BP readings . For a visual, please reference this diagram: http://ccnc.care/bpdiagram  Provider Name: [facility]     Phone: [phone]  Zone 1: ALL CLEAR  Continue to monitor your symptoms:  . BP reading is less than 140 (top number) or less than 90 (bottom number)  . No right upper stomach pain . No headaches or seeing spots . No feeling nauseated or throwing up . No swelling in face and hands  Zone 2: CAUTION Call your  doctor's office for any of the following:  . BP reading is greater than 140 (top number) or greater than 90 (bottom number)  . Stomach pain under your ribs in the middle or right side . Headaches or seeing spots . Feeling nauseated or throwing up . Swelling in face and hands  Zone 3: EMERGENCY  Seek immediate medical care if you have any of the following:  . BP reading is greater than160 (top number) or greater than 110 (bottom number) . Severe headaches not improving with Tylenol . Serious difficulty catching your breath . Any worsening symptoms from Zone 2   Third Trimester of Pregnancy The third trimester is from week 29 through week 42, months 7 through 9. The third trimester is a time when the fetus is growing rapidly. At the end of the ninth month, the fetus is about 20 inches in length and weighs 6-10 pounds.  BODY CHANGES Your body goes through many changes during pregnancy. The changes vary from woman to woman.   Your weight will continue to increase. You can expect to gain 25-35 pounds (11-16 kg) by the end of the pregnancy.  You may begin to get stretch marks on your hips, abdomen, and breasts.  You may urinate more often because the fetus is moving lower into your pelvis and pressing on your bladder.  You may develop or continue to have heartburn as a result of your pregnancy.  You may develop constipation because certain hormones are causing the muscles that push waste through your intestines to slow down.  You may develop hemorrhoids or swollen, bulging veins (varicose veins).  You may have pelvic pain because of the weight gain and pregnancy hormones relaxing your joints between the bones in your pelvis. Backaches may result from overexertion of the muscles supporting your posture.  You may have changes in your hair. These can include thickening of your hair, rapid growth, and changes in texture. Some women also have hair loss during or after pregnancy, or hair that  feels dry or thin. Your hair will most likely return to normal after your baby is born.  Your breasts will continue to grow and be tender. A yellow discharge may leak from your breasts called colostrum.  Your belly button may stick out.  You may feel short of breath because of your expanding uterus.  You may notice the fetus "dropping," or moving lower in your abdomen.  You may have a bloody mucus discharge. This usually occurs a few days to a week before labor begins.  Your cervix becomes thin and soft (effaced) near your due date. WHAT TO EXPECT AT YOUR PRENATAL EXAMS  You will have prenatal exams every 2 weeks until week 36. Then, you will have weekly prenatal exams. During a routine prenatal visit:  You will be weighed to make sure you and the fetus are growing normally.  Your blood pressure is taken.  Your abdomen will be measured to track your baby's growth.  The fetal heartbeat will be listened to.  Any test results from the previous visit will be discussed.  You may have a cervical check near your due date to see if you have effaced. At around 36 weeks, your caregiver will check your cervix. At the same time, your caregiver will also perform a test on the secretions of the vaginal tissue. This test is to determine if a type of bacteria, Group B streptococcus, is present. Your caregiver will explain this further. Your caregiver may ask you:  What your birth plan is.  How you are feeling.  If you are feeling the baby move.  If you have had any abnormal symptoms, such as leaking fluid, bleeding, severe headaches, or abdominal cramping.  If you have any questions. Other tests or screenings that may be performed during your third trimester include:  Blood tests that check for low iron levels (anemia).  Fetal testing to check the health, activity level, and growth of the fetus. Testing is done if you have certain medical conditions or if there are problems during the  pregnancy. FALSE LABOR You may feel small, irregular contractions that eventually go away. These are called Braxton Hicks contractions, or false labor. Contractions may last for hours, days, or even weeks before true labor sets in. If contractions come at regular intervals, intensify, or become painful, it is best to be seen by your caregiver.  SIGNS OF LABOR   Menstrual-like cramps.  Contractions that are 5 minutes apart or less.  Contractions that start on the top of the uterus and spread down to the lower abdomen and back.  A sense of increased pelvic pressure or back pain.  A watery or bloody mucus discharge that comes from the vagina. If you have any of these signs before the 37th week of pregnancy, call your caregiver right away. You need to go to the hospital to get checked immediately. HOME CARE INSTRUCTIONS   Avoid all smoking, herbs, alcohol, and unprescribed drugs. These chemicals affect the formation and growth of the baby.  Follow your caregiver's instructions regarding medicine use. There are medicines that are either safe or unsafe to take during pregnancy.  Exercise only as directed by your caregiver. Experiencing uterine cramps is a good sign to stop exercising.  Continue to eat regular, healthy meals.  Wear a good support bra for breast tenderness.  Do not use hot tubs, steam rooms, or saunas.  Wear your seat belt at all times when driving.  Avoid raw meat, uncooked cheese, cat litter boxes, and soil used by cats. These carry germs that can cause birth defects in the baby.  Take your prenatal vitamins.  Try taking a stool softener (if your caregiver approves) if you develop constipation. Eat more high-fiber foods, such as fresh vegetables or fruit and whole grains. Drink plenty of fluids to keep your urine clear or pale yellow.  Take warm sitz baths to soothe any pain or discomfort caused by hemorrhoids. Use hemorrhoid cream if your caregiver approves.  If you  develop varicose veins, wear support hose. Elevate your feet for 15 minutes, 3-4 times a day. Limit salt in your diet.  Avoid heavy lifting, wear low heal shoes, and practice good posture.  Rest a lot with your legs elevated if you have leg cramps or low  back pain.  Visit your dentist if you have not gone during your pregnancy. Use a soft toothbrush to brush your teeth and be gentle when you floss.  A sexual relationship may be continued unless your caregiver directs you otherwise.  Do not travel far distances unless it is absolutely necessary and only with the approval of your caregiver.  Take prenatal classes to understand, practice, and ask questions about the labor and delivery.  Make a trial run to the hospital.  Pack your hospital bag.  Prepare the baby's nursery.  Continue to go to all your prenatal visits as directed by your caregiver. SEEK MEDICAL CARE IF:  You are unsure if you are in labor or if your water has broken.  You have dizziness.  You have mild pelvic cramps, pelvic pressure, or nagging pain in your abdominal area.  You have persistent nausea, vomiting, or diarrhea.  You have a bad smelling vaginal discharge.  You have pain with urination. SEEK IMMEDIATE MEDICAL CARE IF:   You have a fever.  You are leaking fluid from your vagina.  You have spotting or bleeding from your vagina.  You have severe abdominal cramping or pain.  You have rapid weight loss or gain.  You have shortness of breath with chest pain.  You notice sudden or extreme swelling of your face, hands, ankles, feet, or legs.  You have not felt your baby move in over an hour.  You have severe headaches that do not go away with medicine.  You have vision changes. Document Released: 06/25/2001 Document Revised: 07/06/2013 Document Reviewed: 09/01/2012 [facility] Patient Information 2015 [place], [place]. This information is not intended to replace advice given to you by your health  care provider. Make sure you discuss any questions you have with your health care provider.

## 2020-02-03 NOTE — Progress Notes (Signed)
[place] 26+1 wks,breech,anterior placenta,resolved low lying placenta,marginal cord insertion,cx 4.1 cm,afi 17.7 cm,lateral ventricles upper limites of normal,left lateral ventral 1 cm,right lateral ventricle 1.09 cm,fhr 159 bpm,EFW 1076 g 88%

## 2020-02-03 NOTE — Progress Notes (Signed)
LOW-RISK PREGNANCY VISIT Patient name: Maria Brooks MRN [id number]  Date of birth: [birth year] Chief Complaint:   Routine Prenatal Visit (Ultrasound)  History of Present Illness:   Maria Brooks is a 29 y.o. G67P1001 female at [redacted]w[redacted]d with an Estimated Date of Delivery: 05/10/20 being seen today for ongoing management of a low-risk pregnancy.  Depression screen [facility] 2/9 02/03/2020 11/02/2019 04/30/2018  Decreased Interest 1 0 1  Down, Depressed, Hopeless 0 0 0  PHQ - 2 Score 1 0 1  Altered sleeping 1 0 1  Tired, decreased energy 0 1 1  Change in appetite 0 0 0  Feeling bad or failure about yourself  0 0 0  Trouble concentrating 0 0 0  Moving slowly or fidgety/restless 0 0 0  Suicidal thoughts 0 0 0  PHQ-9 Score 2 1 3   Difficult doing work/chores Not difficult at all Not difficult at all -    Today she reports vomited glucola. Trouble sleeping d/t hip/leg pain at night. Contractions: Not present. Vag. Bleeding: None.  Movement: Present. denies leaking of fluid. Review of Systems:   Pertinent items are noted in HPI Denies abnormal vaginal discharge w/ itching/odor/irritation, headaches, visual changes, shortness of breath, chest pain, abdominal pain, severe nausea/vomiting, or problems with urination or bowel movements unless otherwise stated above. Pertinent History Reviewed:  Reviewed past medical,surgical, social, obstetrical and family history.  Reviewed problem list, medications and allergies. Physical Assessment:   Vitals:   02/03/20 1010  BP: 121/79  Pulse: (!) 108  Weight: 215 lb (97.5 kg)  Body mass index is 39.32 kg/m.        Physical Examination:   General appearance: Well appearing, and in no distress  Mental status: Alert, oriented to person, place, and time  Skin: Warm & dry  Cardiovascular: Normal heart rate noted  Respiratory: Normal respiratory effort, no distress  Abdomen: Soft, gravid, nontender  Pelvic: Cervical exam deferred         Extremities: Edema:  None  Fetal Status: Fetal Heart Rate (bpm): 159 u/s   Movement: Present   02/05/20 26+1 wks,breech,anterior placenta,resolved low lying placenta,marginal cord insertion,cx 4.1 cm,afi 17.7 cm,lateral ventricles upper limites of normal,left lateral ventral 1 cm,right lateral ventricle 1.09 cm,fhr 159 bpm,EFW 1076 g 88%   Chaperone: n/a    Results for orders placed or performed in visit on 02/03/20 (from the past 24 hour(s))  POC Urinalysis Dipstick OB   Collection Time: 02/03/20 10:02 AM  Result Value Ref Range   Color, UA     Clarity, UA     Glucose, UA Negative Negative   Bilirubin, UA     Ketones, UA n    Spec Grav, UA     Blood, UA n    pH, UA     POC,PROTEIN,UA Negative Negative, Trace, Small (1+), Moderate (2+), Large (3+), 4+   Urobilinogen, UA     Nitrite, UA n    Leukocytes, UA Negative Negative   Appearance     Odor      Assessment & Plan:  1) Low-risk pregnancy G2P1001 at [redacted]w[redacted]d with an Estimated Date of Delivery: 05/10/20   2) Mild fetal ventriculomegaly, discussed w/ JVF, recommends referral to MFM for detailed u/s, order placed and note routed to Tish to schedule. Discussed w/ pt and husband and gave printed info.  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  Labs/procedures today: u/s, tdap, vomited glucola  Plan:  Continue routine obstetrical care  Next visit: prefers in person    Reviewed: Preterm labor symptoms and general obstetric precautions including but not limited to vaginal bleeding, contractions, leaking of fluid and fetal movement were reviewed in detail with the patient.  All questions were answered.   Follow-up: Return for 1st availabe pn2 (no visit), then 4wks for LROB w/ MD.  Orders Placed This Encounter  Procedures  . [place] MFM  OB DETAIL +14 WK  . Tdap vaccine greater than or equal to 7yo IM  . POC Urinalysis Dipstick OB   Cheral Marker CNM, [facility] 29/22/2021 10:54 AM

## 2020-02-03 NOTE — Telephone Encounter (Signed)
LMOVM she is scheduled with MFM on 7/28 @ 10:00.  She is scheduled for glucola this day as well but will need to reschedule in order to make this appt. Advised to call our office back to do so.

## 2020-02-04 LAB — CBC
Hematocrit: 38.7 % (ref 34.0–46.6)
Hemoglobin: 11.5 g/dL (ref 11.1–15.9)
MCH: 23.6 pg — ABNORMAL LOW (ref 26.6–33.0)
MCHC: 29.7 g/dL — ABNORMAL LOW (ref 31.5–35.7)
MCV: 80 fL (ref 79–97)
Platelets: 223 10*3/uL (ref 150–450)
RBC: 4.87 x10E6/uL (ref 3.77–5.28)
RDW: 14.6 % (ref 11.7–15.4)
WBC: 12.9 10*3/uL — ABNORMAL HIGH (ref 3.4–10.8)

## 2020-02-04 LAB — HIV ANTIBODY (ROUTINE TESTING W REFLEX): HIV Screen 4th Generation wRfx: NONREACTIVE

## 2020-02-04 LAB — ANTIBODY SCREEN: Antibody Screen: NEGATIVE

## 2020-02-04 LAB — RPR: RPR Ser Ql: NONREACTIVE

## 2020-02-09 ENCOUNTER — Other Ambulatory Visit: Payer: Self-pay | Admitting: *Deleted

## 2020-02-09 ENCOUNTER — Other Ambulatory Visit: Payer: Self-pay

## 2020-02-09 ENCOUNTER — Ambulatory Visit: Payer: BC Managed Care – PPO | Attending: Women's Health

## 2020-02-09 ENCOUNTER — Other Ambulatory Visit: Payer: BC Managed Care – PPO

## 2020-02-09 ENCOUNTER — Ambulatory Visit: Payer: BC Managed Care – PPO | Admitting: *Deleted

## 2020-02-09 DIAGNOSIS — Z3480 Encounter for supervision of other normal pregnancy, unspecified trimester: Secondary | ICD-10-CM | POA: Insufficient documentation

## 2020-02-09 DIAGNOSIS — O43193 Other malformation of placenta, third trimester: Secondary | ICD-10-CM

## 2020-02-09 DIAGNOSIS — O4443 Low lying placenta NOS or without hemorrhage, third trimester: Secondary | ICD-10-CM

## 2020-02-09 DIAGNOSIS — O4440 Low lying placenta NOS or without hemorrhage, unspecified trimester: Secondary | ICD-10-CM | POA: Insufficient documentation

## 2020-02-09 DIAGNOSIS — E669 Obesity, unspecified: Secondary | ICD-10-CM

## 2020-02-09 DIAGNOSIS — O350XX0 Maternal care for (suspected) central nervous system malformation in fetus, not applicable or unspecified: Secondary | ICD-10-CM | POA: Diagnosis not present

## 2020-02-09 DIAGNOSIS — O99212 Obesity complicating pregnancy, second trimester: Secondary | ICD-10-CM

## 2020-02-09 DIAGNOSIS — O3506X0 Maternal care for (suspected) central nervous system malformation or damage in fetus, hydrocephaly, not applicable or unspecified: Secondary | ICD-10-CM

## 2020-02-09 DIAGNOSIS — IMO0002 Reserved for concepts with insufficient information to code with codable children: Secondary | ICD-10-CM

## 2020-02-09 DIAGNOSIS — O43199 Other malformation of placenta, unspecified trimester: Secondary | ICD-10-CM | POA: Diagnosis present

## 2020-02-09 DIAGNOSIS — Z363 Encounter for antenatal screening for malformations: Secondary | ICD-10-CM

## 2020-02-09 DIAGNOSIS — Z3482 Encounter for supervision of other normal pregnancy, second trimester: Secondary | ICD-10-CM

## 2020-02-09 DIAGNOSIS — Z3A27 27 weeks gestation of pregnancy: Secondary | ICD-10-CM

## 2020-02-15 ENCOUNTER — Other Ambulatory Visit: Payer: BC Managed Care – PPO

## 2020-02-15 ENCOUNTER — Other Ambulatory Visit: Payer: Self-pay

## 2020-02-15 DIAGNOSIS — Z3A27 27 weeks gestation of pregnancy: Secondary | ICD-10-CM

## 2020-02-15 DIAGNOSIS — Z3480 Encounter for supervision of other normal pregnancy, unspecified trimester: Secondary | ICD-10-CM

## 2020-02-16 LAB — GLUCOSE TOLERANCE, 2 HOURS W/ 1HR
Glucose, 1 hour: 86 mg/dL (ref 65–179)
Glucose, 2 hour: 73 mg/dL (ref 65–152)
Glucose, Fasting: 86 mg/dL (ref 65–91)

## 2020-03-02 ENCOUNTER — Ambulatory Visit (INDEPENDENT_AMBULATORY_CARE_PROVIDER_SITE_OTHER): Payer: BC Managed Care – PPO | Admitting: Women's Health

## 2020-03-02 ENCOUNTER — Encounter: Payer: Self-pay | Admitting: Women's Health

## 2020-03-02 ENCOUNTER — Other Ambulatory Visit: Payer: Self-pay

## 2020-03-02 VITALS — BP 120/78 | HR 108 | Wt 216.0 lb

## 2020-03-02 DIAGNOSIS — G2581 Restless legs syndrome: Secondary | ICD-10-CM

## 2020-03-02 DIAGNOSIS — Z3A30 30 weeks gestation of pregnancy: Secondary | ICD-10-CM | POA: Diagnosis not present

## 2020-03-02 DIAGNOSIS — Z3480 Encounter for supervision of other normal pregnancy, unspecified trimester: Secondary | ICD-10-CM

## 2020-03-02 DIAGNOSIS — Z331 Pregnant state, incidental: Secondary | ICD-10-CM | POA: Diagnosis not present

## 2020-03-02 DIAGNOSIS — Z1389 Encounter for screening for other disorder: Secondary | ICD-10-CM | POA: Diagnosis not present

## 2020-03-02 DIAGNOSIS — Z3483 Encounter for supervision of other normal pregnancy, third trimester: Secondary | ICD-10-CM

## 2020-03-02 LAB — POCT URINALYSIS DIPSTICK OB
Blood, UA: NEGATIVE
Glucose, UA: NEGATIVE
Ketones, UA: NEGATIVE
Leukocytes, UA: NEGATIVE
Nitrite, UA: NEGATIVE
POC,PROTEIN,UA: NEGATIVE

## 2020-03-02 NOTE — Progress Notes (Signed)
LOW-RISK PREGNANCY VISIT Patient name: Maria Brooks MRN [id number]  Date of birth: 10-08-1990 Chief Complaint:   Routine Prenatal Visit  History of Present Illness:   Maria Brooks is a 29 y.o. G39P1001 female at [redacted]w[redacted]d with an Estimated Date of Delivery: 05/10/20 being seen today for ongoing management of a low-risk pregnancy.  Depression screen [facility] 2/9 02/03/2020 11/02/2019 04/30/2018  Decreased Interest 1 0 1  Down, Depressed, Hopeless 0 0 0  PHQ - 2 Score 1 0 1  Altered sleeping 1 0 1  Tired, decreased energy 0 1 1  Change in appetite 0 0 0  Feeling bad or failure about yourself  0 0 0  Trouble concentrating 0 0 0  Moving slowly or fidgety/restless 0 0 0  Suicidal thoughts 0 0 0  PHQ-9 Score 2 1 3   Difficult doing work/chores Not difficult at all Not difficult at all -    Today she reports restless legs. Contractions: Not present. Vag. Bleeding: None.  Movement: Present. denies leaking of fluid. Review of Systems:   Pertinent items are noted in HPI Denies abnormal vaginal discharge w/ itching/odor/irritation, headaches, visual changes, shortness of breath, chest pain, abdominal pain, severe nausea/vomiting, or problems with urination or bowel movements unless otherwise stated above. Pertinent History Reviewed:  Reviewed past medical,surgical, social, obstetrical and family history.  Reviewed problem list, medications and allergies. Physical Assessment:   Vitals:   03/02/20 1437  BP: 120/78  Pulse: (!) 108  Weight: 216 lb (98 kg)  Body mass index is 39.51 kg/m.        Physical Examination:   General appearance: Well appearing, and in no distress  Mental status: Alert, oriented to person, place, and time  Skin: Warm & dry  Cardiovascular: Normal heart rate noted  Respiratory: Normal respiratory effort, no distress  Abdomen: Soft, gravid, nontender  Pelvic: Cervical exam deferred         Extremities: Edema: None  Fetal Status: Fetal Heart Rate (bpm): 140 Fundal  Height: 33 cm Movement: Present    Chaperone: n/a    Fingerstick hgb: 9.6  Results for orders placed or performed in visit on 03/02/20 (from the past 24 hour(s))  POC Urinalysis Dipstick OB   Collection Time: 03/02/20  2:38 PM  Result Value Ref Range   Color, UA     Clarity, UA     Glucose, UA Negative Negative   Bilirubin, UA     Ketones, UA neg    Spec Grav, UA     Blood, UA neg    pH, UA     POC,PROTEIN,UA Negative Negative, Trace, Small (1+), Moderate (2+), Large (3+), 4+   Urobilinogen, UA     Nitrite, UA neg    Leukocytes, UA Negative Negative   Appearance     Odor      Assessment & Plan:  1) Low-risk pregnancy G2P1001 at [redacted]w[redacted]d with an Estimated Date of Delivery: 05/10/20   2) Mild fetal ventriculomegaly, had u/s w/ MFM, returns next Wed for f/u  3) H/O central venous sinus thrombosis> on Lovenox 80mg  daily  4) Marginal cord insertion  5) Suspected LGA> EFW 94% @ 27wks  6) Interested in waterbirth> took class in May  7) Restless leg> likely r/t anemia, fingerstick hgb 9.6 today, start Fe bid   Meds: No orders of the defined types were placed in this encounter.  Labs/procedures today: cbc  Plan:  Continue routine obstetrical care  Next visit: prefers in person  Reviewed: Preterm labor symptoms and general obstetric precautions including but not limited to vaginal bleeding, contractions, leaking of fluid and fetal movement were reviewed in detail with the patient.  All questions were answered.  Follow-up: Return in about 2 weeks (around 03/16/2020) for LROB, in person, MD or CNM.  Orders Placed This Encounter  Procedures  . CBC  . POC Urinalysis Dipstick OB   Cheral Marker CNM, [facility] 03/02/2020 3:08 PM

## 2020-03-02 NOTE — Patient Instructions (Addendum)
Maria Brooks, I greatly value your feedback.  If you receive a survey following your visit with [place] today, we appreciate you taking the time to fill it out.  Thanks, [name], CNM, WHNP-BC   [facility] ([address] [place], [place] [address]) Entrance C, located off of [address] valet parking   Take iron supplement twice daily with orange juice  Go to [email or web address] to register for FREE online childbirth classes   Call the office [phone]) or go to [facility] if:  You begin to have strong, frequent contractions  Your water breaks.  Sometimes it is a big gush of fluid, sometimes it is just a trickle that keeps getting your panties wet or running down your legs  You have vaginal bleeding.  It is normal to have a small amount of spotting if your cervix was checked.   You don't feel your baby moving like normal.  If you don't, get you something to eat and drink and lay down and focus on feeling your baby move.  You should feel at least 10 movements in 2 hours.  If you don't, you should call the office or go to [facility].    Tdap Vaccine  It is recommended that you get the Tdap vaccine during the third trimester of EACH pregnancy to help protect your baby from getting pertussis (whooping cough)  27-36 weeks is the BEST time to do this so that you can pass the protection on to your baby. During pregnancy is better than after pregnancy, but if you are unable to get it during pregnancy it will be offered at the hospital.   You can get this vaccine with [place], at the health department, your family doctor, or some local pharmacies  Everyone who will be around your baby should also be up-to-date on their vaccines before the baby comes. Adults (who are not pregnant) only need 1 dose of Tdap during adulthood.   [place] Pediatricians/Family Doctors:  [facility] [phone]            [facility]  [phone]                 [facility] [phone] (usually not accepting new patients unless you have family there already, you are always welcome to call and ask)       [facility] Department [phone]       [facility] Pediatricians/Family Doctors:   [facility]: [phone]  [facility]: [phone]  [facility]: [phone]  [facility] Doctors:   [facility]: [phone]   [facility]: [phone]  [facility] Doctors:  [facility]: [phone]   Home Blood Pressure Monitoring for Patients   Your provider has recommended that you check your blood pressure (BP) at least once a week at home. If you do not have a blood pressure cuff at home, one will be provided for you. Contact your provider if you have not received your monitor within 1 week.   Helpful Tips for Accurate Home Blood Pressure Checks  . Don't smoke, exercise, or drink caffeine 30 minutes before checking your BP . Use the restroom before checking your BP (a full bladder can raise your pressure) . Relax in a comfortable upright chair . Feet on the ground . Left arm resting comfortably on a flat surface at the level of your heart . Legs uncrossed . Back supported . Sit  quietly and don't talk . Place the cuff on your bare arm . Adjust snuggly, so that only two fingertips can fit between your skin and the top of the cuff . Check 2 readings separated by at least one minute . Keep a log of your BP readings . For a visual, please reference this diagram: http://ccnc.care/bpdiagram  Provider Name: [facility]     Phone: [phone]  Zone 1: ALL CLEAR  Continue to monitor your symptoms:  . BP reading is less than 140 (top number) or less than 90 (bottom number)  . No right upper stomach pain . No headaches or seeing spots . No feeling nauseated or throwing up . No  swelling in face and hands  Zone 2: CAUTION Call your doctor's office for any of the following:  . BP reading is greater than 140 (top number) or greater than 90 (bottom number)  . Stomach pain under your ribs in the middle or right side . Headaches or seeing spots . Feeling nauseated or throwing up . Swelling in face and hands  Zone 3: EMERGENCY  Seek immediate medical care if you have any of the following:  . BP reading is greater than160 (top number) or greater than 110 (bottom number) . Severe headaches not improving with Tylenol . Serious difficulty catching your breath . Any worsening symptoms from Zone 2   Third Trimester of Pregnancy The third trimester is from week 29 through week 42, months 7 through 9. The third trimester is a time when the fetus is growing rapidly. At the end of the ninth month, the fetus is about 20 inches in length and weighs 6-10 pounds.  BODY CHANGES Your body goes through many changes during pregnancy. The changes vary from woman to woman.   Your weight will continue to increase. You can expect to gain 25-35 pounds (11-16 kg) by the end of the pregnancy.  You may begin to get stretch marks on your hips, abdomen, and breasts.  You may urinate more often because the fetus is moving lower into your pelvis and pressing on your bladder.  You may develop or continue to have heartburn as a result of your pregnancy.  You may develop constipation because certain hormones are causing the muscles that push waste through your intestines to slow down.  You may develop hemorrhoids or swollen, bulging veins (varicose veins).  You may have pelvic pain because of the weight gain and pregnancy hormones relaxing your joints between the bones in your pelvis. Backaches may result from overexertion of the muscles supporting your posture.  You may have changes in your hair. These can include thickening of your hair, rapid growth, and changes in texture. Some women also  have hair loss during or after pregnancy, or hair that feels dry or thin. Your hair will most likely return to normal after your baby is born.  Your breasts will continue to grow and be tender. A yellow discharge may leak from your breasts called colostrum.  Your belly button may stick out.  You may feel short of breath because of your expanding uterus.  You may notice the fetus "dropping," or moving lower in your abdomen.  You may have a bloody mucus discharge. This usually occurs a few days to a week before labor begins.  Your cervix becomes thin and soft (effaced) near your due date. WHAT TO EXPECT AT YOUR PRENATAL EXAMS  You will have prenatal exams every 2 weeks until week 36. Then, you will  have weekly prenatal exams. During a routine prenatal visit:  You will be weighed to make sure you and the fetus are growing normally.  Your blood pressure is taken.  Your abdomen will be measured to track your baby's growth.  The fetal heartbeat will be listened to.  Any test results from the previous visit will be discussed.  You may have a cervical check near your due date to see if you have effaced. At around 36 weeks, your caregiver will check your cervix. At the same time, your caregiver will also perform a test on the secretions of the vaginal tissue. This test is to determine if a type of bacteria, Group B streptococcus, is present. Your caregiver will explain this further. Your caregiver may ask you:  What your birth plan is.  How you are feeling.  If you are feeling the baby move.  If you have had any abnormal symptoms, such as leaking fluid, bleeding, severe headaches, or abdominal cramping.  If you have any questions. Other tests or screenings that may be performed during your third trimester include:  Blood tests that check for low iron levels (anemia).  Fetal testing to check the health, activity level, and growth of the fetus. Testing is done if you have certain  medical conditions or if there are problems during the pregnancy. FALSE LABOR You may feel small, irregular contractions that eventually go away. These are called Braxton Hicks contractions, or false labor. Contractions may last for hours, days, or even weeks before true labor sets in. If contractions come at regular intervals, intensify, or become painful, it is best to be seen by your caregiver.  SIGNS OF LABOR   Menstrual-like cramps.  Contractions that are 5 minutes apart or less.  Contractions that start on the top of the uterus and spread down to the lower abdomen and back.  A sense of increased pelvic pressure or back pain.  A watery or bloody mucus discharge that comes from the vagina. If you have any of these signs before the 37th week of pregnancy, call your caregiver right away. You need to go to the hospital to get checked immediately. HOME CARE INSTRUCTIONS   Avoid all smoking, herbs, alcohol, and unprescribed drugs. These chemicals affect the formation and growth of the baby.  Follow your caregiver's instructions regarding medicine use. There are medicines that are either safe or unsafe to take during pregnancy.  Exercise only as directed by your caregiver. Experiencing uterine cramps is a good sign to stop exercising.  Continue to eat regular, healthy meals.  Wear a good support bra for breast tenderness.  Do not use hot tubs, steam rooms, or saunas.  Wear your seat belt at all times when driving.  Avoid raw meat, uncooked cheese, cat litter boxes, and soil used by cats. These carry germs that can cause birth defects in the baby.  Take your prenatal vitamins.  Try taking a stool softener (if your caregiver approves) if you develop constipation. Eat more high-fiber foods, such as fresh vegetables or fruit and whole grains. Drink plenty of fluids to keep your urine clear or pale yellow.  Take warm sitz baths to soothe any pain or discomfort caused by hemorrhoids. Use  hemorrhoid cream if your caregiver approves.  If you develop varicose veins, wear support hose. Elevate your feet for 15 minutes, 3-4 times a day. Limit salt in your diet.  Avoid heavy lifting, wear low heal shoes, and practice good posture.  Rest a lot with  your legs elevated if you have leg cramps or low back pain.  Visit your dentist if you have not gone during your pregnancy. Use a soft toothbrush to brush your teeth and be gentle when you floss.  A sexual relationship may be continued unless your caregiver directs you otherwise.  Do not travel far distances unless it is absolutely necessary and only with the approval of your caregiver.  Take prenatal classes to understand, practice, and ask questions about the labor and delivery.  Make a trial run to the hospital.  Pack your hospital bag.  Prepare the baby's nursery.  Continue to go to all your prenatal visits as directed by your caregiver. SEEK MEDICAL CARE IF:  You are unsure if you are in labor or if your water has broken.  You have dizziness.  You have mild pelvic cramps, pelvic pressure, or nagging pain in your abdominal area.  You have persistent nausea, vomiting, or diarrhea.  You have a bad smelling vaginal discharge.  You have pain with urination. SEEK IMMEDIATE MEDICAL CARE IF:   You have a fever.  You are leaking fluid from your vagina.  You have spotting or bleeding from your vagina.  You have severe abdominal cramping or pain.  You have rapid weight loss or gain.  You have shortness of breath with chest pain.  You notice sudden or extreme swelling of your face, hands, ankles, feet, or legs.  You have not felt your baby move in over an hour.  You have severe headaches that do not go away with medicine.  You have vision changes. Document Released: 06/25/2001 Document Revised: 07/06/2013 Document Reviewed: 09/01/2012 [facility] -  Patient Information 2015 [place], [place]. This information is not  intended to replace advice given to you by your health care provider. Make sure you discuss any questions you have with your health care provider.  COVID-19 Vaccines While Pregnant or Breastfeeding Updated January 11, 2020  Pregnant and recently pregnant people are more likely to get severely ill with COVID-19 compared with non-pregnant people. If you are pregnant, you can receive a COVID-19 vaccine. Getting a COVID-19 vaccine during pregnancy can protect you from severe illness from COVID-19. If you have questions about getting vaccinated, a conversation with your healthcare provider might help, but is not required for vaccination.  Pregnant and Recently Pregnant People Are at Increased Risk for Severe Illness from COVID-19 Although the overall risk of severe illness is low, pregnant and recently pregnant people are at an increased risk for severe illness from COVID-19 when compared with non-pregnant people. Severe illness includes illness that requires hospitalization, intensive care, or a ventilator or special equipment to breathe, or illness that results in death. Additionally, pregnant people with COVID-19 are at increased risk of preterm birth and might be at increased risk of other adverse pregnancy outcomes compared with pregnant women without COVID-19.  If you are facing a decision about whether to receive a COVID-19 vaccine while pregnant, consider:  Your risk of exposure to COVID-19 The risks of severe illness The known benefits of vaccination The limited but growing evidence about the safety of vaccinations during pregnancy Limited Data Are Available about the Safety of COVID-19 Vaccines for People Who Are Pregnant Based on how these vaccines work in the body, experts believe they are unlikely to pose a risk for people who are pregnant. However, there are currently limited data on the safety of COVID-19 vaccines in pregnant people.  Clinical trials that study the safety of COVID-19  vaccines  and how well they work in pregnant people are underway or planned. Vaccine manufacturers are also collecting and reviewing data from people in the completed clinical trials who received vaccine and became pregnant. Studies in animals receiving a Moderna, Pfizer-BioNTech, or J&J/Janssen COVID-19 vaccine before or during pregnancy found no safety concerns in pregnant animals or their babies. The Centers for Disease Control and Prevention (CDC) and the [employer] Drug Administration (FDA) have safety monitoring systems in place to gather information about COVID-19 vaccination during pregnancy and will closely monitor that information. Early dataexternal icon from these systems are preliminary, but reassuring. These data did not identify any safety concerns for pregnant people who were vaccinated or for their babies. Most of the pregnancies reported in these systems are ongoing, so more follow-up data are needed for people vaccinated just before or early in pregnancy. We will continue to follow people vaccinated during all trimesters of pregnancy to understand effects on pregnancy and babies.  The Moderna and Pfizer-BioNTech vaccines are mRNA vaccines that do not contain the live virus that causes COVID-19 and therefore, cannot give someone COVID-19. Additionally, mRNA vaccines do not interact with a person's DNA or cause genetic changes because the mRNA does not enter the nucleus of the cell, which is where our DNA is kept. Learn more about how COVID-19 mRNA vaccines work.  The J&J/Janssen COVID-19 Vaccine is a viral vector vaccine, meaning it uses a modified version of a different virus (the vector) to deliver important instructions to our cells. Vaccines that use the same viral vector have been given to pregnant people in all trimesters of pregnancy, including in a large-scale Ebola vaccination trial. No adverse pregnancy-related outcomes, including adverse outcomes that affected the infant, were  associated with vaccination in these trials. Learn more about how viral vector vaccines work.  Johnson & Johnson's [employer] DibblesJanssen (J&J/Janssen) COVID-19 Vaccine: The Centers for Disease Control and Prevention (CDC) and the US Food and Drug Administration (FDA) recommended that use of (J&J/Janssen) COVID-19 Vaccine resume in the [place], effective November 05, 2019. However, women younger than 29 years old should especially be aware of the rare risk of blood clots with low platelets after vaccination. There are other COVID-19 vaccines available for which this risk has not been seen. If you received a J&J/Janssen COVID-19 Vaccine, here is what you need to know. Read the CDC/FDA statement.  If you are pregnant and receive a COVID-19 vaccine, consider participating in the v-safe pregnancy registry If you are pregnant and have received a COVID-19 vaccine, we encourage you to enroll in v-safe. V-safe is CDC's smartphone-based tool that uses text messaging and web surveys to provide personalized health check-ins after vaccination. A v-safe pregnancy registry has been established to gather information on the health of pregnant people who have received a COVID-19 vaccine. If people enrolled in v-safe report that they were pregnant at the time of vaccination or after vaccination, the registry staff might contact them to learn more. Participation is voluntary, and participants may opt out at any time.  Getting Vaccinated is a Optician, dispensingersonal Choice If you are pregnant, you can receive a COVID-19 vaccine. You may want to have a conversation with your healthcare provider to help you decide whether to receive a vaccine that has been authorized for use under Emergency Use Authorization. While a conversation with your healthcare provider may be helpful, it is not required prior to vaccination.  Key considerations you can discuss with your healthcare provider include:  How likely you are to being  exposed to the virus that causes  COVID-19 Risks of COVID-19 to you and the potential risks to your fetus or infant What is known about COVID-19 vaccines: How well they work to develop protection in the body Known side effects of vaccination Limited, but growing, information on the safety of COVID-19 vaccination during pregnancy How vaccination might pass antibodies to the fetus. Recent reports have shown that people who have received COVID-19 mRNA vaccines during pregnancy (mostly during their third trimester) have passed antibodies to their fetuses, which could help protect them after birth. If you are pregnant and have questions about COVID-19 vaccine If you would like to speak to someone about COVID-19 vaccination during pregnancy, please contact [employer]. [employer] experts are available to answer questions in [place] or Spanish by phone or chat. The free and confidential service is available Monday-Friday 8am-5pm (local time). To reach [employer]:  Call [phone] Chat live or send an email MotherToBabyexternal icon Follow Recommendations to Prevent the Spread of COVID-19 after Vaccination If you are pregnant and decide to get vaccinated:  After you are fully vaccinated, you can resume activities that you did prior to the pandemic. Learn more about what you can do when you have been fully vaccinated.  If you have a condition or are taking medications that weaken your immune system, you may NOT be fully protected even if you are fully vaccinated. Talk to your healthcare provider. Even after vaccination, you may need to continue taking all precautions.  Vaccine Side Effects Side effects can occur after receiving any of the available COVID-19 vaccines, especially after the second dose for vaccines that require two doses. Pregnant people have not reported different side effects from non-pregnant people after vaccination with mRNA vaccines (Moderna and Pfizer-BioNTech vaccines). If you experience fever following  vaccination you should take acetaminophen (Tylenol) because fever --for any reason-- has been associated with adverse pregnancy outcomes. Learn more at What to Expect after Getting a COVID-19 Vaccine.  Although rare, some people have had allergic reactions after receiving a COVID-19 vaccine. Talk with your healthcare provider if you have a history of allergic reaction to any other vaccine or injectable therapy (intramuscular, intravenous, or subcutaneous).  Key considerations you can discuss with your healthcare provider include:  The unknown risks of developing a severe allergic reaction The benefits of vaccination If you have an allergic reaction after receiving a COVID-19 vaccine during pregnancy, you can receive treatment for it.  People Who Are Breastfeeding Clinical trials for the COVID-19 vaccines currently authorized for use under an Emergency Use Authorization in the [place] did not include people who are breastfeeding. Because the vaccines have not been studied on lactating people, there are no data available on the:  Safety of COVID-19 vaccines in lactating people Effects of vaccination on the breastfed baby Effects on milk production or excretion Based on how these vaccines work in the body, COVID-19 vaccines are thought not to be a risk to lactating people or their breastfeeding babies. Therefore, lactating people can receive a COVID-19 vaccine. Recent reports have shown that breastfeeding people who have received COVID-19 mRNA vaccines have antibodies in their breastmilk, which could help protect their babies. More data are needed to determine what protection these antibodies may provide to the baby.  People Who Would Like to Have a Baby If trying to get pregnant now or in the future, would-be parents can receive a COVID-19 vaccine.  There is currently no evidence that any vaccines, including COVID-19 vaccines, cause female or female  fertility problems--problems getting  pregnant. CDC does not recommend routine pregnancy testing before COVID-19 vaccination. If you are trying to become pregnant, you do not need to avoid pregnancy after receiving a COVID-19 vaccine. Like with all vaccines, scientists are studying COVID-19 vaccines carefully for side effects now and will report findings as they become available.

## 2020-03-03 LAB — CBC
Hematocrit: 33.6 % — ABNORMAL LOW (ref 34.0–46.6)
Hemoglobin: 10.3 g/dL — ABNORMAL LOW (ref 11.1–15.9)
MCH: 23 pg — ABNORMAL LOW (ref 26.6–33.0)
MCHC: 30.7 g/dL — ABNORMAL LOW (ref 31.5–35.7)
MCV: 75 fL — ABNORMAL LOW (ref 79–97)
Platelets: 214 10*3/uL (ref 150–450)
RBC: 4.47 x10E6/uL (ref 3.77–5.28)
RDW: 14.8 % (ref 11.7–15.4)
WBC: 10.6 10*3/uL (ref 3.4–10.8)

## 2020-03-08 ENCOUNTER — Ambulatory Visit: Payer: BC Managed Care – PPO | Attending: Obstetrics and Gynecology

## 2020-03-08 ENCOUNTER — Ambulatory Visit: Payer: BC Managed Care – PPO | Admitting: *Deleted

## 2020-03-08 ENCOUNTER — Other Ambulatory Visit: Payer: Self-pay

## 2020-03-08 DIAGNOSIS — O4440 Low lying placenta NOS or without hemorrhage, unspecified trimester: Secondary | ICD-10-CM | POA: Diagnosis present

## 2020-03-08 DIAGNOSIS — O350XX0 Maternal care for (suspected) central nervous system malformation in fetus, not applicable or unspecified: Secondary | ICD-10-CM | POA: Diagnosis not present

## 2020-03-08 DIAGNOSIS — O43193 Other malformation of placenta, third trimester: Secondary | ICD-10-CM | POA: Diagnosis not present

## 2020-03-08 DIAGNOSIS — E669 Obesity, unspecified: Secondary | ICD-10-CM

## 2020-03-08 DIAGNOSIS — Z3A31 31 weeks gestation of pregnancy: Secondary | ICD-10-CM

## 2020-03-08 DIAGNOSIS — O4443 Low lying placenta NOS or without hemorrhage, third trimester: Secondary | ICD-10-CM | POA: Diagnosis not present

## 2020-03-08 DIAGNOSIS — Z3480 Encounter for supervision of other normal pregnancy, unspecified trimester: Secondary | ICD-10-CM | POA: Diagnosis present

## 2020-03-08 DIAGNOSIS — O322XX0 Maternal care for transverse and oblique lie, not applicable or unspecified: Secondary | ICD-10-CM | POA: Diagnosis not present

## 2020-03-08 DIAGNOSIS — O99213 Obesity complicating pregnancy, third trimester: Secondary | ICD-10-CM | POA: Diagnosis not present

## 2020-03-08 DIAGNOSIS — O43199 Other malformation of placenta, unspecified trimester: Secondary | ICD-10-CM | POA: Insufficient documentation

## 2020-03-09 ENCOUNTER — Other Ambulatory Visit: Payer: Self-pay | Admitting: *Deleted

## 2020-03-09 DIAGNOSIS — Z362 Encounter for other antenatal screening follow-up: Secondary | ICD-10-CM

## 2020-03-15 ENCOUNTER — Ambulatory Visit (INDEPENDENT_AMBULATORY_CARE_PROVIDER_SITE_OTHER): Payer: BC Managed Care – PPO | Admitting: Advanced Practice Midwife

## 2020-03-15 VITALS — BP 119/74 | HR 113 | Wt 213.0 lb

## 2020-03-15 DIAGNOSIS — Z331 Pregnant state, incidental: Secondary | ICD-10-CM | POA: Diagnosis not present

## 2020-03-15 DIAGNOSIS — O350XX0 Maternal care for (suspected) central nervous system malformation in fetus, not applicable or unspecified: Secondary | ICD-10-CM

## 2020-03-15 DIAGNOSIS — O3509X0 Maternal care for (suspected) other central nervous system malformation or damage in fetus, not applicable or unspecified: Secondary | ICD-10-CM | POA: Insufficient documentation

## 2020-03-15 DIAGNOSIS — Z3A32 32 weeks gestation of pregnancy: Secondary | ICD-10-CM

## 2020-03-15 DIAGNOSIS — Z3480 Encounter for supervision of other normal pregnancy, unspecified trimester: Secondary | ICD-10-CM

## 2020-03-15 DIAGNOSIS — Z1389 Encounter for screening for other disorder: Secondary | ICD-10-CM | POA: Diagnosis not present

## 2020-03-15 LAB — POCT URINALYSIS DIPSTICK OB
Blood, UA: NEGATIVE
Glucose, UA: NEGATIVE
Ketones, UA: NEGATIVE
Leukocytes, UA: NEGATIVE
Nitrite, UA: NEGATIVE
POC,PROTEIN,UA: NEGATIVE

## 2020-03-15 NOTE — Progress Notes (Signed)
   LOW-RISK PREGNANCY VISIT Patient name: Maria Brooks MRN [id number]  Date of birth: 1990-10-01 Chief Complaint:   Routine Prenatal Visit  History of Present Illness:   Maria Brooks is a 29 y.o. G43P1001 female at [redacted]w[redacted]d with an Estimated Date of Delivery: 05/10/20 being seen today for ongoing management of a low-risk pregnancy.  Today she reports restless leg and sleeping are improved this past week; questions re EFW and what our end of preg plan may be; still interested in waterbirth. Contractions: Not present. Vag. Bleeding: None.  Movement: Present. denies leaking of fluid. Review of Systems:   Pertinent items are noted in HPI Denies abnormal vaginal discharge w/ itching/odor/irritation, headaches, visual changes, shortness of breath, chest pain, abdominal pain, severe nausea/vomiting, or problems with urination or bowel movements unless otherwise stated above. Pertinent History Reviewed:  Reviewed past medical,surgical, social, obstetrical and family history.  Reviewed problem list, medications and allergies. Physical Assessment:   Vitals:   03/15/20 1024  BP: 119/74  Pulse: (!) 113  Weight: 213 lb (96.6 kg)  Body mass index is 38.96 kg/m.        Physical Examination:   General appearance: Well appearing, and in no distress  Mental status: Alert, oriented to person, place, and time  Skin: Warm & dry  Cardiovascular: Normal heart rate noted  Respiratory: Normal respiratory effort, no distress  Abdomen: Soft, gravid, nontender  Pelvic: Cervical exam deferred         Extremities: Edema: None  Fetal Status: Fetal Heart Rate (bpm): 142 Fundal Height: 32 cm Movement: Present    Results for orders placed or performed in visit on 03/15/20 (from the past 24 hour(s))  POC Urinalysis Dipstick OB   Collection Time: 03/15/20 10:21 AM  Result Value Ref Range   Color, UA     Clarity, UA     Glucose, UA Negative Negative   Bilirubin, UA     Ketones, UA n    Spec Grav, UA      Blood, UA n    pH, UA     POC,PROTEIN,UA Negative Negative, Trace, Small (1+), Moderate (2+), Large (3+), 4+   Urobilinogen, UA     Nitrite, UA n    Leukocytes, UA Negative Negative   Appearance     Odor      Assessment & Plan:  1) Low-risk pregnancy G2P1001 at [redacted]w[redacted]d with an Estimated Date of Delivery: 05/10/20   2) Mild ventriculomegaly, being followed by MFM- next scan 9/22  3) Suspected LGA, EFW @ 31wks was 2301gm (99th%); reviewed not necessarily an indication for IOL, but can discuss elective at 39-40wks, however can interfere w/ waterbirth plan- will continue to discuss  4) Hx CVS thrombosis, on Lovenox 80mg  qd  5) Marg cord insertion, growth scans   Meds: No orders of the defined types were placed in this encounter.  Labs/procedures today: none  Plan:  Continue routine obstetrical care with MFM f/u due to marg cord insertion and mild ventriculomegaly  Reviewed: Preterm labor symptoms and general obstetric precautions including but not limited to vaginal bleeding, contractions, leaking of fluid and fetal movement were reviewed in detail with the patient.  All questions were answered. Has home bp cuff. Check bp weekly, let know if >140/90.   Follow-up: Return in about 2 weeks (around 03/29/2020) for LROB, in person.  Orders Placed This Encounter  Procedures  . POC Urinalysis Dipstick OB   03/31/2020 [facility] 03/15/2020 10:54 AM

## 2020-03-15 NOTE — Patient Instructions (Signed)
Maria Brooks, I greatly value your feedback.  If you receive a survey following your visit with [place] today, we appreciate you taking the time to fill it out.  Thanks, Philipp Deputy CNM   [facility] ([address] [place], [place] [address]) Entrance C, located off of [address] valet parking  Go to [email or web address] to register for FREE online childbirth classes   Call the office [phone]) or go to [facility] if:  You begin to have strong, frequent contractions  Your water breaks.  Sometimes it is a big gush of fluid, sometimes it is just a trickle that keeps getting your panties wet or running down your legs  You have vaginal bleeding.  It is normal to have a small amount of spotting if your cervix was checked.   You don't feel your baby moving like normal.  If you don't, get you something to eat and drink and lay down and focus on feeling your baby move.  You should feel at least 10 movements in 2 hours.  If you don't, you should call the office or go to [facility].    Tdap Vaccine  It is recommended that you get the Tdap vaccine during the third trimester of EACH pregnancy to help protect your baby from getting pertussis (whooping cough)  27-36 weeks is the BEST time to do this so that you can pass the protection on to your baby. During pregnancy is better than after pregnancy, but if you are unable to get it during pregnancy it will be offered at the hospital.   You can get this vaccine with [place], at the health department, your family doctor, or some local pharmacies  Everyone who will be around your baby should also be up-to-date on their vaccines before the baby comes. Adults (who are not pregnant) only need 1 dose of Tdap during adulthood.   [place] Pediatricians/Family Doctors:  [facility] [phone]            [facility] [phone]                 [facility] [phone]  (usually not accepting new patients unless you have family there already, you are always welcome to call and ask)       [facility] Department [phone]       [facility] Pediatricians/Family Doctors:   [facility]: [phone]  [facility]: [phone]  [facility]: [phone]  [facility] Doctors:   [facility]: [phone]   [facility]: [phone]  [facility] Doctors:  [facility]: [phone]   Home Blood Pressure Monitoring for Patients   Your provider has recommended that you check your blood pressure (BP) at least once a week at home. If you do not have a blood pressure cuff at home, one will be provided for you. Contact your provider if you have not received your monitor within 1 week.   Helpful Tips for Accurate Home Blood Pressure Checks  . Don't smoke, exercise, or drink caffeine 30 minutes before checking your BP . Use the restroom before checking your BP (a full bladder can raise your pressure) . Relax in a comfortable upright chair . Feet on the ground . Left arm resting comfortably on a flat surface at the level of your heart . Legs uncrossed . Back supported . Sit quietly and don't talk . Place the cuff on your bare  arm . Adjust snuggly, so that only two fingertips can fit between your skin and the top of the cuff . Check 2 readings separated by at least one minute . Keep a log of your BP readings . For a visual, please reference this diagram: http://ccnc.care/bpdiagram  Provider Name: [facility]     Phone: [phone]  Zone 1: ALL CLEAR  Continue to monitor your symptoms:  . BP reading is less than 140 (top number) or less than 90 (bottom number)  . No right upper stomach pain . No headaches or seeing spots . No feeling nauseated or throwing up . No swelling in face and hands  Zone 2: CAUTION Call your doctor's office for  any of the following:  . BP reading is greater than 140 (top number) or greater than 90 (bottom number)  . Stomach pain under your ribs in the middle or right side . Headaches or seeing spots . Feeling nauseated or throwing up . Swelling in face and hands  Zone 3: EMERGENCY  Seek immediate medical care if you have any of the following:  . BP reading is greater than160 (top number) or greater than 110 (bottom number) . Severe headaches not improving with Tylenol . Serious difficulty catching your breath . Any worsening symptoms from Zone 2   Third Trimester of Pregnancy The third trimester is from week 29 through week 42, months 7 through 9. The third trimester is a time when the fetus is growing rapidly. At the end of the ninth month, the fetus is about 20 inches in length and weighs 6-10 pounds.  BODY CHANGES Your body goes through many changes during pregnancy. The changes vary from woman to woman.   Your weight will continue to increase. You can expect to gain 25-35 pounds (11-16 kg) by the end of the pregnancy.  You may begin to get stretch marks on your hips, abdomen, and breasts.  You may urinate more often because the fetus is moving lower into your pelvis and pressing on your bladder.  You may develop or continue to have heartburn as a result of your pregnancy.  You may develop constipation because certain hormones are causing the muscles that push waste through your intestines to slow down.  You may develop hemorrhoids or swollen, bulging veins (varicose veins).  You may have pelvic pain because of the weight gain and pregnancy hormones relaxing your joints between the bones in your pelvis. Backaches may result from overexertion of the muscles supporting your posture.  You may have changes in your hair. These can include thickening of your hair, rapid growth, and changes in texture. Some women also have hair loss during or after pregnancy, or hair that feels dry or thin.  Your hair will most likely return to normal after your baby is born.  Your breasts will continue to grow and be tender. A yellow discharge may leak from your breasts called colostrum.  Your belly button may stick out.  You may feel short of breath because of your expanding uterus.  You may notice the fetus "dropping," or moving lower in your abdomen.  You may have a bloody mucus discharge. This usually occurs a few days to a week before labor begins.  Your cervix becomes thin and soft (effaced) near your due date. WHAT TO EXPECT AT YOUR PRENATAL EXAMS  You will have prenatal exams every 2 weeks until week 36. Then, you will have weekly prenatal exams. During a routine prenatal visit:  You  will be weighed to make sure you and the fetus are growing normally.  Your blood pressure is taken.  Your abdomen will be measured to track your baby's growth.  The fetal heartbeat will be listened to.  Any test results from the previous visit will be discussed.  You may have a cervical check near your due date to see if you have effaced. At around 36 weeks, your caregiver will check your cervix. At the same time, your caregiver will also perform a test on the secretions of the vaginal tissue. This test is to determine if a type of bacteria, Group B streptococcus, is present. Your caregiver will explain this further. Your caregiver may ask you:  What your birth plan is.  How you are feeling.  If you are feeling the baby move.  If you have had any abnormal symptoms, such as leaking fluid, bleeding, severe headaches, or abdominal cramping.  If you have any questions. Other tests or screenings that may be performed during your third trimester include:  Blood tests that check for low iron levels (anemia).  Fetal testing to check the health, activity level, and growth of the fetus. Testing is done if you have certain medical conditions or if there are problems during the pregnancy. FALSE  LABOR You may feel small, irregular contractions that eventually go away. These are called Braxton Hicks contractions, or false labor. Contractions may last for hours, days, or even weeks before true labor sets in. If contractions come at regular intervals, intensify, or become painful, it is best to be seen by your caregiver.  SIGNS OF LABOR   Menstrual-like cramps.  Contractions that are 5 minutes apart or less.  Contractions that start on the top of the uterus and spread down to the lower abdomen and back.  A sense of increased pelvic pressure or back pain.  A watery or bloody mucus discharge that comes from the vagina. If you have any of these signs before the 37th week of pregnancy, call your caregiver right away. You need to go to the hospital to get checked immediately. HOME CARE INSTRUCTIONS   Avoid all smoking, herbs, alcohol, and unprescribed drugs. These chemicals affect the formation and growth of the baby.  Follow your caregiver's instructions regarding medicine use. There are medicines that are either safe or unsafe to take during pregnancy.  Exercise only as directed by your caregiver. Experiencing uterine cramps is a good sign to stop exercising.  Continue to eat regular, healthy meals.  Wear a good support bra for breast tenderness.  Do not use hot tubs, steam rooms, or saunas.  Wear your seat belt at all times when driving.  Avoid raw meat, uncooked cheese, cat litter boxes, and soil used by cats. These carry germs that can cause birth defects in the baby.  Take your prenatal vitamins.  Try taking a stool softener (if your caregiver approves) if you develop constipation. Eat more high-fiber foods, such as fresh vegetables or fruit and whole grains. Drink plenty of fluids to keep your urine clear or pale yellow.  Take warm sitz baths to soothe any pain or discomfort caused by hemorrhoids. Use hemorrhoid cream if your caregiver approves.  If you develop varicose  veins, wear support hose. Elevate your feet for 15 minutes, 3-4 times a day. Limit salt in your diet.  Avoid heavy lifting, wear low heal shoes, and practice good posture.  Rest a lot with your legs elevated if you have leg cramps or low back  pain.  Visit your dentist if you have not gone during your pregnancy. Use a soft toothbrush to brush your teeth and be gentle when you floss.  A sexual relationship may be continued unless your caregiver directs you otherwise.  Do not travel far distances unless it is absolutely necessary and only with the approval of your caregiver.  Take prenatal classes to understand, practice, and ask questions about the labor and delivery.  Make a trial run to the hospital.  Pack your hospital bag.  Prepare the baby's nursery.  Continue to go to all your prenatal visits as directed by your caregiver. SEEK MEDICAL CARE IF:  You are unsure if you are in labor or if your water has broken.  You have dizziness.  You have mild pelvic cramps, pelvic pressure, or nagging pain in your abdominal area.  You have persistent nausea, vomiting, or diarrhea.  You have a bad smelling vaginal discharge.  You have pain with urination. SEEK IMMEDIATE MEDICAL CARE IF:   You have a fever.  You are leaking fluid from your vagina.  You have spotting or bleeding from your vagina.  You have severe abdominal cramping or pain.  You have rapid weight loss or gain.  You have shortness of breath with chest pain.  You notice sudden or extreme swelling of your face, hands, ankles, feet, or legs.  You have not felt your baby move in over an hour.  You have severe headaches that do not go away with medicine.  You have vision changes. Document Released: 06/25/2001 Document Revised: 07/06/2013 Document Reviewed: 09/01/2012 [employer] Patient Information 2015 [place], [place]. This information is not intended to replace advice given to you by your health care provider. Make  sure you discuss any questions you have with your health care provider.

## 2020-03-30 ENCOUNTER — Encounter: Payer: Self-pay | Admitting: Obstetrics & Gynecology

## 2020-03-30 ENCOUNTER — Ambulatory Visit (INDEPENDENT_AMBULATORY_CARE_PROVIDER_SITE_OTHER): Payer: BC Managed Care – PPO | Admitting: Obstetrics & Gynecology

## 2020-03-30 VITALS — BP 120/78 | HR 113 | Wt 215.0 lb

## 2020-03-30 DIAGNOSIS — Z3480 Encounter for supervision of other normal pregnancy, unspecified trimester: Secondary | ICD-10-CM

## 2020-03-30 DIAGNOSIS — O3663X1 Maternal care for excessive fetal growth, third trimester, fetus 1: Secondary | ICD-10-CM

## 2020-03-30 NOTE — Progress Notes (Signed)
   LOW-RISK PREGNANCY VISIT Patient name: Maria Brooks MRN [id number]  Date of birth: [birth year] Chief Complaint:   Routine Prenatal Visit  History of Present Illness:   Maria Brooks is a 29 y.o. G43P1001 female at [redacted]w[redacted]d with an Estimated Date of Delivery: 05/10/20 being seen today for ongoing management of a low-risk pregnancy.  Depression screen [facility] 2/9 02/03/2020 11/02/2019 04/30/2018  Decreased Interest 1 0 1  Down, Depressed, Hopeless 0 0 0  PHQ - 2 Score 1 0 1  Altered sleeping 1 0 1  Tired, decreased energy 0 1 1  Change in appetite 0 0 0  Feeling bad or failure about yourself  0 0 0  Trouble concentrating 0 0 0  Moving slowly or fidgety/restless 0 0 0  Suicidal thoughts 0 0 0  PHQ-9 Score 2 1 3   Difficult doing work/chores Not difficult at all Not difficult at all -    Today she reports no complaints. Contractions: Not present. Vag. Bleeding: None.  Movement: Present. denies leaking of fluid. Review of Systems:   Pertinent items are noted in HPI Denies abnormal vaginal discharge w/ itching/odor/irritation, headaches, visual changes, shortness of breath, chest pain, abdominal pain, severe nausea/vomiting, or problems with urination or bowel movements unless otherwise stated above. Pertinent History Reviewed:  Reviewed past medical,surgical, social, obstetrical and family history.  Reviewed problem list, medications and allergies. Physical Assessment:   Vitals:   03/30/20 1416  BP: 120/78  Pulse: (!) 113  Weight: 215 lb (97.5 kg)  Body mass index is 39.32 kg/m.        Physical Examination:   General appearance: Well appearing, and in no distress  Mental status: Alert, oriented to person, place, and time  Skin: Warm & dry  Cardiovascular: Normal heart rate noted  Respiratory: Normal respiratory effort, no distress  Abdomen: Soft, gravid, nontender  Pelvic: Cervical exam deferred         Extremities: Edema: None  Fetal Status:     Movement: Present    Chaperone:  n/a    No results found for this or any previous visit (from the past 24 hour(s)).  Assessment & Plan:  1) Low-risk pregnancy G2P1001 at [redacted]w[redacted]d with an Estimated Date of Delivery: 05/10/20   2) FH 38 cm, 99% at 31 weeks, growth scan in 2 weeks,   3) mild isolated ventriculomegaly is stable   Meds: No orders of the defined types were placed in this encounter.  Labs/procedures today:   Plan:  Continue routine obstetrical care  Next visit: prefers in person    Reviewed: Preterm labor symptoms and general obstetric precautions including but not limited to vaginal bleeding, contractions, leaking of fluid and fetal movement were reviewed in detail with the patient.  All questions were answered. Has home bp cuff. Rx faxed to . Check bp weekly, let 05/12/20 know if >140/90.   Follow-up: Return in about 2 weeks (around 04/13/2020) for sonogram, LROB, with Dr 04/15/2020.  Orders Placed This Encounter  Procedures  . [facility] Follow Up   US  03/30/2020 3:04 PM

## 2020-04-05 ENCOUNTER — Ambulatory Visit: Payer: BC Managed Care – PPO

## 2020-04-13 ENCOUNTER — Ambulatory Visit (INDEPENDENT_AMBULATORY_CARE_PROVIDER_SITE_OTHER): Payer: BC Managed Care – PPO | Admitting: Obstetrics & Gynecology

## 2020-04-13 ENCOUNTER — Encounter: Payer: Self-pay | Admitting: Obstetrics & Gynecology

## 2020-04-13 ENCOUNTER — Other Ambulatory Visit (HOSPITAL_COMMUNITY)
Admission: RE | Admit: 2020-04-13 | Discharge: 2020-04-13 | Disposition: A | Discharge status: Home / Self Care | Payer: BC Managed Care – PPO | Source: Ambulatory Visit | Attending: Obstetrics & Gynecology | Admitting: Obstetrics & Gynecology

## 2020-04-13 VITALS — BP 125/78 | HR 103 | Wt 214.0 lb

## 2020-04-13 DIAGNOSIS — Z3A36 36 weeks gestation of pregnancy: Secondary | ICD-10-CM

## 2020-04-13 DIAGNOSIS — Z3480 Encounter for supervision of other normal pregnancy, unspecified trimester: Secondary | ICD-10-CM | POA: Insufficient documentation

## 2020-04-13 DIAGNOSIS — Z1389 Encounter for screening for other disorder: Secondary | ICD-10-CM

## 2020-04-13 DIAGNOSIS — Z331 Pregnant state, incidental: Secondary | ICD-10-CM

## 2020-04-13 LAB — POCT URINALYSIS DIPSTICK OB
Blood, UA: NEGATIVE
Glucose, UA: NEGATIVE
Ketones, UA: NEGATIVE
Nitrite, UA: NEGATIVE

## 2020-04-13 NOTE — Progress Notes (Signed)
LOW-RISK PREGNANCY VISIT Patient name: Maria Brooks MRN [id number]  Date of birth: Jun 28, 1991 Chief Complaint:   Routine Prenatal Visit (GBS, GC/CHL)  History of Present Illness:   Maria Brooks is a 29 y.o. G79P1001 female at [redacted]w[redacted]d with an Estimated Date of Delivery: 05/10/20 being seen today for ongoing management of a low-risk pregnancy.  Depression screen [facility] 2/9 02/03/2020 11/02/2019 04/30/2018  Decreased Interest 1 0 1  Down, Depressed, Hopeless 0 0 0  PHQ - 2 Score 1 0 1  Altered sleeping 1 0 1  Tired, decreased energy 0 1 1  Change in appetite 0 0 0  Feeling bad or failure about yourself  0 0 0  Trouble concentrating 0 0 0  Moving slowly or fidgety/restless 0 0 0  Suicidal thoughts 0 0 0  PHQ-9 Score 2 1 3   Difficult doing work/chores Not difficult at all Not difficult at all -    Today she reports no complaints. Contractions: Not present. Vag. Bleeding: None.  Movement: Present. denies leaking of fluid. Review of Systems:   Pertinent items are noted in HPI Denies abnormal vaginal discharge w/ itching/odor/irritation, headaches, visual changes, shortness of breath, chest pain, abdominal pain, severe nausea/vomiting, or problems with urination or bowel movements unless otherwise stated above. Pertinent History Reviewed:  Reviewed past medical,surgical, social, obstetrical and family history.  Reviewed problem list, medications and allergies. Physical Assessment:   Vitals:   04/13/20 1132  BP: 125/78  Pulse: (!) 103  Weight: 214 lb (97.1 kg)  Body mass index is 39.14 kg/m.        Physical Examination:   General appearance: Well appearing, and in no distress  Mental status: Alert, oriented to person, place, and time  Skin: Warm & dry  Cardiovascular: Normal heart rate noted  Respiratory: Normal respiratory effort, no distress  Abdomen: Soft, gravid, nontender  Pelvic: Cervical exam performed  Dilation: Closed Effacement (%): Thick Station: -3  Extremities: Edema:  None  Fetal Status: Fetal Heart Rate (bpm): 135 Fundal Height: 36 cm Movement: Present Presentation: Vertex  Chaperone: 04/15/20    Results for orders placed or performed in visit on 04/13/20 (from the past 24 hour(s))  POC Urinalysis Dipstick OB   Collection Time: 04/13/20 11:33 AM  Result Value Ref Range   Color, UA     Clarity, UA     Glucose, UA Negative Negative   Bilirubin, UA     Ketones, UA neg    Spec Grav, UA     Blood, UA neg    pH, UA     POC,PROTEIN,UA Trace Negative, Trace, Small (1+), Moderate (2+), Large (3+), 4+   Urobilinogen, UA     Nitrite, UA neg    Leukocytes, UA Moderate (2+) (A) Negative   Appearance     Odor      Assessment & Plan:  1) Low-risk pregnancy G2P1001 at [redacted]w[redacted]d with an Estimated Date of Delivery: 05/10/20   2) Suspected fetal macrosomia, EFW 12 days,   H/o CV thrombosis on lovenox   Meds: No orders of the defined types were placed in this encounter.  Labs/procedures today: cultures  Plan:  Continue routine obstetrical care  Next visit: prefers in person    Reviewed: Term labor symptoms and general obstetric precautions including but not limited to vaginal bleeding, contractions, leaking of fluid and fetal movement were reviewed in detail with the patient.  All questions were answered. Has home bp cuff. Rx faxed to . Check bp weekly,  let us know if >140/90.   Follow-up: Return for keep scheduled, sonogram + follow up after the sonogram.  Orders Placed This Encounter  Procedures  . Strep Gp B NAA  . POC Urinalysis Dipstick OB   Maria Brooks  04/13/2020 12:00 PM

## 2020-04-14 LAB — CERVICOVAGINAL ANCILLARY ONLY
Chlamydia: NEGATIVE
Comment: NEGATIVE
Comment: NORMAL
Neisseria Gonorrhea: NEGATIVE

## 2020-04-15 LAB — STREP GP B NAA: Strep Gp B NAA: POSITIVE — AB

## 2020-04-24 ENCOUNTER — Other Ambulatory Visit: Payer: Self-pay | Admitting: Obstetrics & Gynecology

## 2020-04-24 DIAGNOSIS — O3663X1 Maternal care for excessive fetal growth, third trimester, fetus 1: Secondary | ICD-10-CM

## 2020-04-26 ENCOUNTER — Ambulatory Visit (INDEPENDENT_AMBULATORY_CARE_PROVIDER_SITE_OTHER): Payer: BC Managed Care – PPO | Admitting: Women's Health

## 2020-04-26 ENCOUNTER — Ambulatory Visit (INDEPENDENT_AMBULATORY_CARE_PROVIDER_SITE_OTHER): Payer: BC Managed Care – PPO

## 2020-04-26 ENCOUNTER — Encounter: Payer: Self-pay | Admitting: Women's Health

## 2020-04-26 VITALS — BP 125/86 | HR 98 | Wt 212.0 lb

## 2020-04-26 DIAGNOSIS — Z3A38 38 weeks gestation of pregnancy: Secondary | ICD-10-CM

## 2020-04-26 DIAGNOSIS — O3663X0 Maternal care for excessive fetal growth, third trimester, not applicable or unspecified: Secondary | ICD-10-CM | POA: Diagnosis not present

## 2020-04-26 DIAGNOSIS — O43199 Other malformation of placenta, unspecified trimester: Secondary | ICD-10-CM

## 2020-04-26 DIAGNOSIS — Z3483 Encounter for supervision of other normal pregnancy, third trimester: Secondary | ICD-10-CM

## 2020-04-26 DIAGNOSIS — O3509X0 Maternal care for (suspected) other central nervous system malformation or damage in fetus, not applicable or unspecified: Secondary | ICD-10-CM

## 2020-04-26 DIAGNOSIS — O350XX0 Maternal care for (suspected) central nervous system malformation in fetus, not applicable or unspecified: Secondary | ICD-10-CM

## 2020-04-26 DIAGNOSIS — Z1389 Encounter for screening for other disorder: Secondary | ICD-10-CM

## 2020-04-26 DIAGNOSIS — Z3480 Encounter for supervision of other normal pregnancy, unspecified trimester: Secondary | ICD-10-CM

## 2020-04-26 DIAGNOSIS — Z331 Pregnant state, incidental: Secondary | ICD-10-CM

## 2020-04-26 DIAGNOSIS — O4440 Low lying placenta NOS or without hemorrhage, unspecified trimester: Secondary | ICD-10-CM

## 2020-04-26 LAB — POCT URINALYSIS DIPSTICK OB
Blood, UA: NEGATIVE
Glucose, UA: NEGATIVE
Ketones, UA: NEGATIVE
Leukocytes, UA: NEGATIVE
Nitrite, UA: NEGATIVE
POC,PROTEIN,UA: NEGATIVE

## 2020-04-26 NOTE — Patient Instructions (Signed)
Maria Brooks, I greatly value your feedback.  If you receive a survey following your visit with [place] today, we appreciate you taking the time to fill it out.  Thanks, [name], CNM, WHNP-BC  [facility] ([address] [place], [place] [address]) Entrance C, located off of [address] valet parking   Go to [email or web address] to register for FREE online childbirth classes    Call the office [phone]) or go to [facility] if:  You begin to have strong, frequent contractions  Your water breaks.  Sometimes it is a big gush of fluid, sometimes it is just a trickle that keeps getting your panties wet or running down your legs  You have vaginal bleeding.  It is normal to have a small amount of spotting if your cervix was checked.   You don't feel your baby moving like normal.  If you don't, get you something to eat and drink and lay down and focus on feeling your baby move.  You should feel at least 10 movements in 2 hours.  If you don't, you should call the office or go to [facility].   Call the office [phone]) or go to [facility] hospital for these signs of pre-eclampsia:  Severe headache that does not go away with Tylenol  Visual changes- seeing spots, double, blurred vision  Pain under your right breast or upper abdomen that does not go away with Tums or heartburn medicine  Nausea and/or vomiting  Severe swelling in your hands, feet, and face    Home Blood Pressure Monitoring for Patients   Your provider has recommended that you check your blood pressure (BP) at least once a week at home. If you do not have a blood pressure cuff at home, one will be provided for you. Contact your provider if you have not received your monitor within 1 week.   Helpful Tips for Accurate Home Blood Pressure Checks   Don't smoke, exercise, or drink caffeine 30 minutes before checking your BP  Use the restroom before checking your BP (a full  bladder can raise your pressure)  Relax in a comfortable upright chair  Feet on the ground  Left arm resting comfortably on a flat surface at the level of your heart  Legs uncrossed  Back supported  Sit quietly and don't talk  Place the cuff on your bare arm  Adjust snuggly, so that only two fingertips can fit between your skin and the top of the cuff  Check 2 readings separated by at least one minute  Keep a log of your BP readings  For a visual, please reference this diagram: http://ccnc.care/bpdiagram  Provider Name: [facility]     Phone: [phone]  Zone 1: ALL CLEAR  Continue to monitor your symptoms:   BP reading is less than 140 (top number) or less than 90 (bottom number)   No right upper stomach pain  No headaches or seeing spots  No feeling nauseated or throwing up  No swelling in face and hands  Zone 2: CAUTION Call your doctor's office for any of the following:   BP reading is greater than 140 (top number) or greater than 90 (bottom number)   Stomach pain under your ribs in the middle or right side  Headaches or seeing spots  Feeling nauseated or throwing up  Swelling in face and hands  Zone 3: EMERGENCY  Seek immediate medical care if you have any of  the following:   BP reading is greater than160 (top number) or greater than 110 (bottom number)  Severe headaches not improving with Tylenol  Serious difficulty catching your breath  Any worsening symptoms from Zone 2   Braxton Hicks Contractions Contractions of the uterus can occur throughout pregnancy, but they are not always a sign that you are in labor. You may have practice contractions called Braxton Hicks contractions. These false labor contractions are sometimes confused with true labor. What are Maria Brooks contractions? Braxton Hicks contractions are tightening movements that occur in the muscles of the uterus before labor. Unlike true labor contractions, these  contractions do not result in opening (dilation) and thinning of the cervix. Toward the end of pregnancy (32-34 weeks), Braxton Hicks contractions can happen more often and may become stronger. These contractions are sometimes difficult to tell apart from true labor because they can be very uncomfortable. You should not feel embarrassed if you go to the hospital with false labor. Sometimes, the only way to tell if you are in true labor is for your health care provider to look for changes in the cervix. The health care provider will do a physical exam and may monitor your contractions. If you are not in true labor, the exam should show that your cervix is not dilating and your water has not broken. If there are no other health problems associated with your pregnancy, it is completely safe for you to be sent home with false labor. You may continue to have Braxton Hicks contractions until you go into true labor. How to tell the difference between true labor and false labor True labor  Contractions last 30-70 seconds.  Contractions become very regular.  Discomfort is usually felt in the top of the uterus, and it spreads to the lower abdomen and low back.  Contractions do not go away with walking.  Contractions usually become more intense and increase in frequency.  The cervix dilates and gets thinner. False labor  Contractions are usually shorter and not as strong as true labor contractions.  Contractions are usually irregular.  Contractions are often felt in the front of the lower abdomen and in the groin.  Contractions may go away when you walk around or change positions while lying down.  Contractions get weaker and are shorter-lasting as time goes on.  The cervix usually does not dilate or become thin. Follow these instructions at home:  1. Take over-the-counter and prescription medicines only as told by your health care provider. 2. Keep up with your usual exercises and follow other  instructions from your health care provider. 3. Eat and drink lightly if you think you are going into labor. 4. If Braxton Hicks contractions are making you uncomfortable: ? Change your position from lying down or resting to walking, or change from walking to resting. ? Sit and rest in a tub of warm water. ? Drink enough fluid to keep your urine pale yellow. Dehydration may cause these contractions. ? Do slow and deep breathing several times an hour. 5. Keep all follow-up prenatal visits as told by your health care provider. This is important. Contact a health care provider if:  You have a fever.  You have continuous pain in your abdomen. Get help right away if:  Your contractions become stronger, more regular, and closer together.  You have fluid leaking or gushing from your vagina.  You pass blood-tinged mucus (bloody show).  You have bleeding from your vagina.  You have low back  pain that you never had before.  You feel your babys head pushing down and causing pelvic pressure.  Your baby is not moving inside you as much as it used to. Summary  Contractions that occur before labor are called Braxton Hicks contractions, false labor, or practice contractions.  Braxton Hicks contractions are usually shorter, weaker, farther apart, and less regular than true labor contractions. True labor contractions usually become progressively stronger and regular, and they become more frequent.  Manage discomfort from [facility] contractions by changing position, resting in a warm bath, drinking plenty of water, or practicing deep breathing. This information is not intended to replace advice given to you by your health care provider. Make sure you discuss any questions you have with your health care provider. Document Revised: 06/13/2017 Document Reviewed: 11/14/2016 Elsevier Patient Education  2020 [employer].    Considering [place]? Guide for patients at [facility] [employer] Why consider waterbirth?  Gentle birth for babies   Less pain medicine used in labor   May allow for passive descent/less pushing   May reduce perineal tears   More mobility and instinctive maternal position changes   Increased maternal relaxation   Reduced blood pressure in labor   Is waterbirth safe? What are the risks of infection, drowning or other complications?  Infection:   Very low risk (3.7 % for tub vs 4.8% for bed)   7 in 8000 waterbirths with documented infection   Poorly cleaned equipment most common cause   Slightly lower group B strep transmission rate   Drowning   Maternal:   Very low risk   Related to seizures or fainting   Newborn:   Very low risk. No evidence of increased risk of respiratory problems in multiple large studies   Physiological protection from breathing under water   Avoid underwater birth if there are any fetal complications   Once baby's head is out of the water, keep it out.   Birth complication   Some reports of cord trauma, but risk decreased by bringing baby to surface gradually   No evidence of increased risk of shoulder dystocia. Mothers can usually change positions faster in water than in a bed, possibly aiding the maneuvers to free the shoulder.  ? You must attend a Waterbirth class at [employer] at [facility]  3rd Wednesday of every month from 7-9pm   [facility] by calling [phone] or online at [email or web address]   Bring [place] the certificate from the class to your prenatal appointment  Meet with a midwife at 36 weeks to see if you can still plan a waterbirth and to sign the consent.   If you plan a waterbirth at [employer] and [facility] at [facility], the following purchases are optional:  Clinical research associate top   [employer]    Things that would prevent you from having a waterbirth:  Unknown or Positive COVID-19 diagnosis upon admission  to hospital   Premature, <37wks   Previous cesarean birth   Presence of thick meconium-stained fluid   Multiple gestation (Twins, triplets, etc.)   Uncontrolled diabetes or gestational diabetes requiring medication   Hypertension diagnosed in pregnancy or preexisting hypertension (gestational hypertension, preeclampsia, or chronic hypertension)  Heavy vaginal bleeding   Non-reassuring fetal heart rate   Active infection (MRSA, etc.). Group B Strep is NOT a contraindication for waterbirth.   If your labor has to be induced and induction method requires continuous monitoring  of the baby's heart rate   Other risks/issues identified by your obstetrical provider    Please remember that birth is unpredictable. Under certain unforeseeable circumstances your provider may advise against giving birth in the tub. These decisions will be made on a case-by-case basis and with the safety of you and your baby as our highest priority.  **Please remember that in order to have a waterbirth, you must test Negative to COVID-19 upon admission to the hospital.**

## 2020-04-26 NOTE — Progress Notes (Signed)
LOW-RISK PREGNANCY VISIT Patient name: Maria Brooks MRN [id number]  Date of birth: [birth year] Chief Complaint:   Routine Prenatal Visit (U/S)  History of Present Illness:   Maria Brooks is a 29 y.o. G52P1001 female at [redacted]w[redacted]d with an Estimated Date of Delivery: 05/10/20 being seen today for ongoing management of a low-risk pregnancy.  Depression screen [facility] 2/9 02/03/2020 11/02/2019 04/30/2018  Decreased Interest 1 0 1  Down, Depressed, Hopeless 0 0 0  PHQ - 2 Score 1 0 1  Altered sleeping 1 0 1  Tired, decreased energy 0 1 1  Change in appetite 0 0 0  Feeling bad or failure about yourself  0 0 0  Trouble concentrating 0 0 0  Moving slowly or fidgety/restless 0 0 0  Suicidal thoughts 0 0 0  PHQ-9 Score 2 1 3   Difficult doing work/chores Not difficult at all Not difficult at all -    Today she reports no complaints. Still wants waterbirth, needs consent. Contractions: Irregular. Vag. Bleeding: None.  Movement: Present. denies leaking of fluid. Review of Systems:   Pertinent items are noted in HPI Denies abnormal vaginal discharge w/ itching/odor/irritation, headaches, visual changes, shortness of breath, chest pain, abdominal pain, severe nausea/vomiting, or problems with urination or bowel movements unless otherwise stated above. Pertinent History Reviewed:  Reviewed past medical,surgical, social, obstetrical and family history.  Reviewed problem list, medications and allergies. Physical Assessment:   Vitals:   04/26/20 1109  BP: 125/86  Pulse: 98  Weight: 212 lb (96.2 kg)  Body mass index is 38.78 kg/m.        Physical Examination:   General appearance: Well appearing, and in no distress  Mental status: Alert, oriented to person, place, and time  Skin: Warm & dry  Cardiovascular: Normal heart rate noted  Respiratory: Normal respiratory effort, no distress  Abdomen: Soft, gravid, nontender  Pelvic: Cervical exam deferred         Extremities: Edema: None  Fetal Status:  Fetal Heart Rate (bpm): 132 u/s   Movement: Present   04/28/20 38 wks,cephalic,anterior placenta gr 2,fhr 132 bpm,lateral ventricles WNL,FHR 18 cm,EFW 3239 g 50%  Chaperone: n/a    Results for orders placed or performed in visit on 04/26/20 (from the past 24 hour(s))  POC Urinalysis Dipstick OB   Collection Time: 04/26/20 11:14 AM  Result Value Ref Range   Color, UA     Clarity, UA     Glucose, UA Negative Negative   Bilirubin, UA     Ketones, UA neg    Spec Grav, UA     Blood, UA neg    pH, UA     POC,PROTEIN,UA Negative Negative, Trace, Small (1+), Moderate (2+), Large (3+), 4+   Urobilinogen, UA     Nitrite, UA neg    Leukocytes, UA Negative Negative   Appearance     Odor      Assessment & Plan:  1) Low-risk pregnancy G2P1001 at [redacted]w[redacted]d with an Estimated Date of Delivery: 05/10/20   2) Interested in 05/12/20, has already attended class, reviewed risks/benefits, consent signed today  3) 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  Labs/procedures today: u/s  Plan:  Continue routine obstetrical care  Next visit: prefers in person    Reviewed: Term labor symptoms and general obstetric precautions including but not limited  to vaginal bleeding, contractions, leaking of fluid and fetal movement were reviewed in detail with the patient.  All questions were answered.   Follow-up: Return in about 1 week (around 05/03/2020) for LROB, CNM, in person.  Orders Placed This Encounter  Procedures  . POC Urinalysis Dipstick OB   Cheral Marker CNM, [facility] 04/26/2020 11:36 AM

## 2020-04-26 NOTE — Progress Notes (Signed)
[place] 38 wks,cephalic,anterior placenta gr 2,fhr 132 bpm,lateral ventricles WNL,FHR 18 cm,EFW 3239 g 50%

## 2020-04-29 ENCOUNTER — Inpatient Hospital Stay (HOSPITAL_COMMUNITY)
Admission: AD | Admit: 2020-04-29 | Discharge: 2020-05-02 | DRG: 788 | Disposition: A | Discharge status: Home / Self Care | Payer: BC Managed Care – PPO | Attending: Obstetrics & Gynecology | Admitting: Obstetrics & Gynecology

## 2020-04-29 ENCOUNTER — Other Ambulatory Visit: Payer: Self-pay

## 2020-04-29 ENCOUNTER — Encounter (HOSPITAL_COMMUNITY): Payer: Self-pay | Admitting: Obstetrics & Gynecology

## 2020-04-29 ENCOUNTER — Inpatient Hospital Stay (HOSPITAL_COMMUNITY): Payer: BC Managed Care – PPO | Admitting: Anesthesiology

## 2020-04-29 ENCOUNTER — Inpatient Hospital Stay (HOSPITAL_COMMUNITY): Payer: BC Managed Care – PPO

## 2020-04-29 ENCOUNTER — Encounter (HOSPITAL_COMMUNITY)
Admission: AD | Disposition: A | Discharge status: Home / Self Care | Payer: Self-pay | Source: Home / Self Care | Attending: Obstetrics & Gynecology

## 2020-04-29 DIAGNOSIS — Z23 Encounter for immunization: Secondary | ICD-10-CM

## 2020-04-29 DIAGNOSIS — O4440 Low lying placenta NOS or without hemorrhage, unspecified trimester: Secondary | ICD-10-CM

## 2020-04-29 DIAGNOSIS — O99824 Streptococcus B carrier state complicating childbirth: Secondary | ICD-10-CM | POA: Diagnosis present

## 2020-04-29 DIAGNOSIS — Z7901 Long term (current) use of anticoagulants: Secondary | ICD-10-CM

## 2020-04-29 DIAGNOSIS — O3509X0 Maternal care for (suspected) other central nervous system malformation or damage in fetus, not applicable or unspecified: Secondary | ICD-10-CM

## 2020-04-29 DIAGNOSIS — Z86718 Personal history of other venous thrombosis and embolism: Secondary | ICD-10-CM | POA: Diagnosis not present

## 2020-04-29 DIAGNOSIS — Z09 Encounter for follow-up examination after completed treatment for conditions other than malignant neoplasm: Secondary | ICD-10-CM

## 2020-04-29 DIAGNOSIS — O350XX0 Maternal care for (suspected) central nervous system malformation in fetus, not applicable or unspecified: Principal | ICD-10-CM

## 2020-04-29 DIAGNOSIS — O4593 Premature separation of placenta, unspecified, third trimester: Secondary | ICD-10-CM | POA: Diagnosis present

## 2020-04-29 DIAGNOSIS — N939 Abnormal uterine and vaginal bleeding, unspecified: Secondary | ICD-10-CM | POA: Diagnosis present

## 2020-04-29 DIAGNOSIS — Z3A38 38 weeks gestation of pregnancy: Secondary | ICD-10-CM | POA: Diagnosis not present

## 2020-04-29 DIAGNOSIS — O43199 Other malformation of placenta, unspecified trimester: Secondary | ICD-10-CM

## 2020-04-29 DIAGNOSIS — Z3480 Encounter for supervision of other normal pregnancy, unspecified trimester: Secondary | ICD-10-CM

## 2020-04-29 DIAGNOSIS — O43123 Velamentous insertion of umbilical cord, third trimester: Secondary | ICD-10-CM | POA: Diagnosis present

## 2020-04-29 DIAGNOSIS — Z20822 Contact with and (suspected) exposure to covid-19: Secondary | ICD-10-CM | POA: Diagnosis present

## 2020-04-29 DIAGNOSIS — O26893 Other specified pregnancy related conditions, third trimester: Secondary | ICD-10-CM | POA: Diagnosis present

## 2020-04-29 DIAGNOSIS — O4590 Premature separation of placenta, unspecified, unspecified trimester: Secondary | ICD-10-CM

## 2020-04-29 LAB — TYPE AND SCREEN
ABO/RH(D): AB POS
Antibody Screen: NEGATIVE

## 2020-04-29 LAB — CBC
HCT: 33 % — ABNORMAL LOW (ref 36.0–46.0)
Hemoglobin: 9.4 g/dL — ABNORMAL LOW (ref 12.0–15.0)
MCH: 20.8 pg — ABNORMAL LOW (ref 26.0–34.0)
MCHC: 28.5 g/dL — ABNORMAL LOW (ref 30.0–36.0)
MCV: 73 fL — ABNORMAL LOW (ref 80.0–100.0)
Platelets: 215 10*3/uL (ref 150–400)
RBC: 4.52 MIL/uL (ref 3.87–5.11)
RDW: 17.5 % — ABNORMAL HIGH (ref 11.5–15.5)
WBC: 9.9 10*3/uL (ref 4.0–10.5)
nRBC: 0 % (ref 0.0–0.2)

## 2020-04-29 LAB — RESPIRATORY PANEL BY RT PCR (FLU A&B, COVID)
Influenza A by PCR: NEGATIVE
Influenza B by PCR: NEGATIVE
SARS Coronavirus 2 by RT PCR: NEGATIVE

## 2020-04-29 LAB — RPR: RPR Ser Ql: NONREACTIVE

## 2020-04-29 SURGERY — Surgical Case
Anesthesia: Epidural

## 2020-04-29 MED ORDER — LIDOCAINE HCL (PF) 1 % IJ SOLN: 30.0000 mL | INTRAMUSCULAR | Status: DC | PRN

## 2020-04-29 MED ORDER — DEXAMETHASONE SODIUM PHOSPHATE 10 MG/ML IJ SOLN
INTRAMUSCULAR | Status: DC | PRN
  Administered 2020-04-29: 10 mg via INTRAVENOUS

## 2020-04-29 MED ORDER — OXYTOCIN-SODIUM CHLORIDE 30-0.9 UT/500ML-% IV SOLN
INTRAVENOUS | Status: AC
  Filled 2020-04-29: qty 500

## 2020-04-29 MED ORDER — FENTANYL CITRATE (PF) 100 MCG/2ML IJ SOLN
INTRAMUSCULAR | Status: DC | PRN
Start: 2020-04-29 — End: 2020-04-29
  Administered 2020-04-29: 100 ug via EPIDURAL

## 2020-04-29 MED ORDER — LIDOCAINE HCL (PF) 1 % IJ SOLN
INTRAMUSCULAR | Status: DC | PRN
  Administered 2020-04-29: 5 mL via EPIDURAL

## 2020-04-29 MED ORDER — SODIUM CHLORIDE 0.9 % IR SOLN
Status: DC | PRN
  Administered 2020-04-29: 1000 mL

## 2020-04-29 MED ORDER — EPHEDRINE 5 MG/ML INJ: 10.0000 mg | INTRAVENOUS | Status: DC | PRN

## 2020-04-29 MED ORDER — MORPHINE SULFATE (PF) 0.5 MG/ML IJ SOLN
INTRAMUSCULAR | Status: AC
  Filled 2020-04-29: qty 10

## 2020-04-29 MED ORDER — OXYCODONE HCL 5 MG PO TABS: 5.0000 mg | ORAL_TABLET | Freq: Once | ORAL | Status: DC | PRN

## 2020-04-29 MED ORDER — OXYCODONE HCL 5 MG/5ML PO SOLN: 5.0000 mg | Freq: Once | ORAL | Status: DC | PRN

## 2020-04-29 MED ORDER — SOD CITRATE-CITRIC ACID 500-334 MG/5ML PO SOLN: 30.0000 mL | ORAL | Status: DC | PRN

## 2020-04-29 MED ORDER — KETOROLAC TROMETHAMINE 30 MG/ML IJ SOLN
INTRAMUSCULAR | Status: AC
  Filled 2020-04-29: qty 1

## 2020-04-29 MED ORDER — CEFAZOLIN SODIUM-DEXTROSE 2-4 GM/100ML-% IV SOLN
2.0000 g | Freq: Once | INTRAVENOUS | Status: AC
  Administered 2020-04-29: 2 g via INTRAVENOUS
  Filled 2020-04-29: qty 100

## 2020-04-29 MED ORDER — KETOROLAC TROMETHAMINE 30 MG/ML IJ SOLN: 30.0000 mg | Freq: Once | INTRAMUSCULAR | Status: AC | PRN

## 2020-04-29 MED ORDER — PHENYLEPHRINE HCL (PRESSORS) 10 MG/ML IV SOLN
INTRAVENOUS | Status: DC | PRN
  Administered 2020-04-29: 80 ug via INTRAVENOUS

## 2020-04-29 MED ORDER — ONDANSETRON HCL 4 MG/2ML IJ SOLN
INTRAMUSCULAR | Status: AC
  Filled 2020-04-29: qty 2

## 2020-04-29 MED ORDER — LIDOCAINE-EPINEPHRINE (PF) 2 %-1:200000 IJ SOLN
INTRAMUSCULAR | Status: DC | PRN
  Administered 2020-04-29 (×2): 5 mL via EPIDURAL

## 2020-04-29 MED ORDER — ONDANSETRON HCL 4 MG/2ML IJ SOLN: 4.0000 mg | Freq: Four times a day (QID) | INTRAMUSCULAR | Status: DC | PRN

## 2020-04-29 MED ORDER — OXYTOCIN-SODIUM CHLORIDE 30-0.9 UT/500ML-% IV SOLN: 2.5000 [IU]/h | INTRAVENOUS | Status: DC

## 2020-04-29 MED ORDER — SODIUM CHLORIDE 0.9 % IV SOLN: 5.0000 10*6.[IU] | Freq: Once | INTRAVENOUS | Status: DC

## 2020-04-29 MED ORDER — SODIUM CHLORIDE 0.9 % IV SOLN: INTRAVENOUS | Status: DC | PRN

## 2020-04-29 MED ORDER — PENICILLIN G POT IN DEXTROSE 60000 UNIT/ML IV SOLN: 3.0000 10*6.[IU] | INTRAVENOUS | Status: DC

## 2020-04-29 MED ORDER — CEFAZOLIN SODIUM-DEXTROSE 2-4 GM/100ML-% IV SOLN
INTRAVENOUS | Status: AC
  Filled 2020-04-29: qty 100

## 2020-04-29 MED ORDER — LACTATED RINGERS IV SOLN
500.0000 mL | Freq: Once | INTRAVENOUS | Status: AC
  Administered 2020-04-29: 500 mL via INTRAVENOUS

## 2020-04-29 MED ORDER — MORPHINE SULFATE (PF) 0.5 MG/ML IJ SOLN
INTRAMUSCULAR | Status: DC | PRN
Start: 2020-04-29 — End: 2020-04-29
  Administered 2020-04-29: 3 mg via EPIDURAL

## 2020-04-29 MED ORDER — CEFAZOLIN SODIUM-DEXTROSE 1-4 GM/50ML-% IV SOLN
1.0000 g | Freq: Three times a day (TID) | INTRAVENOUS | Status: DC
  Administered 2020-04-29: 1 g via INTRAVENOUS
  Administered 2020-04-29: 2 g via INTRAVENOUS
  Filled 2020-04-29 (×2): qty 50

## 2020-04-29 MED ORDER — FENTANYL-BUPIVACAINE-NACL 0.5-0.125-0.9 MG/250ML-% EP SOLN
12.0000 mL/h | EPIDURAL | Status: DC | PRN
  Filled 2020-04-29: qty 250

## 2020-04-29 MED ORDER — ACETAMINOPHEN 325 MG PO TABS: 650.0000 mg | ORAL_TABLET | ORAL | Status: DC | PRN

## 2020-04-29 MED ORDER — FENTANYL CITRATE (PF) 100 MCG/2ML IJ SOLN
INTRAMUSCULAR | Status: AC
  Filled 2020-04-29: qty 2

## 2020-04-29 MED ORDER — CEFAZOLIN SODIUM-DEXTROSE 2-4 GM/100ML-% IV SOLN: 2.0000 g | Freq: Four times a day (QID) | INTRAVENOUS | Status: DC

## 2020-04-29 MED ORDER — TERBUTALINE SULFATE 1 MG/ML IJ SOLN: 0.2500 mg | Freq: Once | INTRAMUSCULAR | Status: DC | PRN

## 2020-04-29 MED ORDER — DEXAMETHASONE SODIUM PHOSPHATE 10 MG/ML IJ SOLN
INTRAMUSCULAR | Status: AC
  Filled 2020-04-29: qty 1

## 2020-04-29 MED ORDER — PHENYLEPHRINE 40 MCG/ML (10ML) SYRINGE FOR IV PUSH (FOR BLOOD PRESSURE SUPPORT)
80.0000 ug | PREFILLED_SYRINGE | INTRAVENOUS | Status: DC | PRN
  Filled 2020-04-29: qty 10

## 2020-04-29 MED ORDER — LACTATED RINGERS IV SOLN: 500.0000 mL | INTRAVENOUS | Status: DC | PRN

## 2020-04-29 MED ORDER — PHENYLEPHRINE HCL (PRESSORS) 10 MG/ML IV SOLN
INTRAVENOUS | Status: DC | PRN
  Administered 2020-04-29: 80 ug via INTRAVENOUS
  Administered 2020-04-29 (×2): 120 ug via INTRAVENOUS
  Administered 2020-04-29: 80 ug via INTRAVENOUS

## 2020-04-29 MED ORDER — OXYTOCIN-SODIUM CHLORIDE 30-0.9 UT/500ML-% IV SOLN
INTRAVENOUS | Status: DC | PRN
  Administered 2020-04-29: 30 [IU] via INTRAVENOUS

## 2020-04-29 MED ORDER — DIPHENHYDRAMINE HCL 50 MG/ML IJ SOLN: 12.5000 mg | INTRAMUSCULAR | Status: DC | PRN

## 2020-04-29 MED ORDER — PHENYLEPHRINE 40 MCG/ML (10ML) SYRINGE FOR IV PUSH (FOR BLOOD PRESSURE SUPPORT)
80.0000 ug | PREFILLED_SYRINGE | INTRAVENOUS | Status: DC | PRN
  Administered 2020-04-29: 80 ug via INTRAVENOUS

## 2020-04-29 MED ORDER — ONDANSETRON HCL 4 MG/2ML IJ SOLN
INTRAMUSCULAR | Status: DC | PRN
  Administered 2020-04-29: 4 mg via INTRAVENOUS

## 2020-04-29 MED ORDER — ONDANSETRON HCL 4 MG/2ML IJ SOLN: 4.0000 mg | Freq: Once | INTRAMUSCULAR | Status: DC | PRN

## 2020-04-29 MED ORDER — HYDROMORPHONE HCL 1 MG/ML IJ SOLN: 0.2500 mg | INTRAMUSCULAR | Status: DC | PRN

## 2020-04-29 MED ORDER — OXYTOCIN BOLUS FROM INFUSION: 333.0000 mL | Freq: Once | INTRAVENOUS | Status: DC

## 2020-04-29 MED ORDER — LACTATED RINGERS IV SOLN: INTRAVENOUS | Status: DC

## 2020-04-29 MED ORDER — DEXAMETHASONE SODIUM PHOSPHATE 10 MG/ML IJ SOLN: INTRAMUSCULAR | Status: DC | PRN

## 2020-04-29 MED ORDER — BUPIVACAINE HCL (PF) 0.75 % IJ SOLN
INTRAMUSCULAR | Status: DC | PRN
Start: 2020-04-29 — End: 2020-04-29
  Administered 2020-04-29: 12 mL/h via EPIDURAL

## 2020-04-29 MED ORDER — STERILE WATER FOR IRRIGATION IR SOLN
Status: DC | PRN
  Administered 2020-04-29: 1000 mL

## 2020-04-29 MED ORDER — MISOPROSTOL 50MCG HALF TABLET: 50.0000 ug | ORAL_TABLET | ORAL | Status: DC

## 2020-04-29 SURGICAL SUPPLY — 37 items
ADH SKN CLS LQ APL DERMABOND (GAUZE/BANDAGES/DRESSINGS) ×2
APL SKNCLS STERI-STRIP NONHPOA (GAUZE/BANDAGES/DRESSINGS) ×1
BENZOIN TINCTURE PRP APPL 2/3 (GAUZE/BANDAGES/DRESSINGS) ×3 IMPLANT
CLAMP CORD UMBIL (MISCELLANEOUS) ×3 IMPLANT
CLOSURE WOUND 1/2 X4 (GAUZE/BANDAGES/DRESSINGS) ×1
CLOTH BEACON ORANGE TIMEOUT ST (SAFETY) ×3 IMPLANT
DERMABOND ADHESIVE PROPEN (GAUZE/BANDAGES/DRESSINGS) ×4
DERMABOND ADVANCED .7 DNX6 (GAUZE/BANDAGES/DRESSINGS) IMPLANT
DRSG OPSITE POSTOP 4X10 (GAUZE/BANDAGES/DRESSINGS) ×3 IMPLANT
ELECT REM PT RETURN 9FT ADLT (ELECTROSURGICAL) ×3
ELECTRODE REM PT RTRN 9FT ADLT (ELECTROSURGICAL) ×1 IMPLANT
EXTRACTOR VACUUM M CUP 4 TUBE (SUCTIONS) IMPLANT
EXTRACTOR VACUUM M CUP 4' TUBE (SUCTIONS)
GLOVE BIOGEL PI IND STRL 7.0 (GLOVE) ×3 IMPLANT
GLOVE BIOGEL PI INDICATOR 7.0 (GLOVE) ×6
GLOVE ECLIPSE 7.0 STRL STRAW (GLOVE) ×3 IMPLANT
GOWN STRL REUS W/TWL LRG LVL3 (GOWN DISPOSABLE) ×6 IMPLANT
KIT ABG SYR 3ML LUER SLIP (SYRINGE) ×3 IMPLANT
NDL HYPO 25X5/8 SAFETYGLIDE (NEEDLE) ×1 IMPLANT
NEEDLE HYPO 22GX1.5 SAFETY (NEEDLE) ×3 IMPLANT
NEEDLE HYPO 25X5/8 SAFETYGLIDE (NEEDLE) ×3 IMPLANT
NS IRRIG 1000ML POUR BTL (IV SOLUTION) ×3 IMPLANT
PACK C SECTION WH (CUSTOM PROCEDURE TRAY) ×3 IMPLANT
PAD ABD 7.5X8 STRL (GAUZE/BANDAGES/DRESSINGS) ×3 IMPLANT
PAD OB MATERNITY 4.3X12.25 (PERSONAL CARE ITEMS) ×3 IMPLANT
PENCIL SMOKE EVAC W/HOLSTER (ELECTROSURGICAL) ×3 IMPLANT
RTRCTR C-SECT PINK 25CM LRG (MISCELLANEOUS) ×3 IMPLANT
STRIP CLOSURE SKIN 1/2X4 (GAUZE/BANDAGES/DRESSINGS) ×2 IMPLANT
SUT MNCRL 0 VIOLET CTX 36 (SUTURE) ×2 IMPLANT
SUT MONOCRYL 0 CTX 36 (SUTURE) ×6
SUT VIC AB 0 CTX 36 (SUTURE) ×3
SUT VIC AB 0 CTX36XBRD ANBCTRL (SUTURE) ×1 IMPLANT
SUT VIC AB 4-0 KS 27 (SUTURE) ×3 IMPLANT
SYR 30ML LL (SYRINGE) ×3 IMPLANT
TOWEL OR 17X24 6PK STRL BLUE (TOWEL DISPOSABLE) ×3 IMPLANT
TRAY FOLEY W/BAG SLVR 14FR LF (SET/KITS/TRAYS/PACK) ×3 IMPLANT
WATER STERILE IRR 1000ML POUR (IV SOLUTION) ×3 IMPLANT

## 2020-04-29 NOTE — Transfer of Care (Signed)
Immediate Anesthesia Transfer of Care Note  Patient: Maria Brooks  Procedure(s) Performed: CESAREAN SECTION  Patient Location: PACU  Anesthesia Type:Epidural  Level of Consciousness: awake, alert  and oriented  Airway & Oxygen Therapy: Patient Spontanous Breathing  Post-op Assessment: Report given to RN and Post -op Vital signs reviewed and stable  Post vital signs: Reviewed and stable  Last Vitals:  Vitals Value Taken Time  BP 104/67 04/29/20 2310  Temp    Pulse 101 04/29/20 2312  Resp 21 04/29/20 2312  SpO2 100 % 04/29/20 2312  Vitals shown include unvalidated device data.  Last Pain:  Vitals:   04/29/20 2045  TempSrc:   PainSc: 2          Complications: No complications documented.

## 2020-04-29 NOTE — MAU Provider Note (Signed)
History     CSN: [id number]  Arrival date and time: 04/29/20 6010   First Provider Initiated Contact with Patient 04/29/20 0801      Chief Complaint  Patient presents with  . Vaginal Bleeding   HPI Maria Brooks is a 29 y.o. G2P1001 at [redacted]w[redacted]d who presents with bright red vaginal bleeding. She states she went to the bathroom at 0530 and had a gush of bright red bleeding. She states the toilet bowl was full of blood. She states she has continued to have some trickling since then. She denies any pain or contractions. Denies any gushes of fluid. Reports normal fetal movement. She has not had sex or a cervical exam in the last few days.   OB History    Gravida  2   Para  1   Term  1   Preterm  0   AB  0   Living  1     SAB  0   TAB  0   Ectopic  0   Multiple  0   Live Births  1           Past Medical History:  Diagnosis Date  . Allergy   . Chickenpox   . 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 GERD (gastroesophageal reflux disease)     Past Surgical History:  Procedure Laterality Date  . NO PAST SURGERIES    . none      Family History  Problem Relation Age of Onset  . Clotting disorder Mother        reportedly present before developing lung cancer  . Hyperlipidemia Father   . Hypertension Father   . Stroke Paternal Grandfather   . Heart disease Paternal Grandfather   . Cancer Maternal Grandmother        lung  . Cataracts Maternal Grandmother   . Alzheimer's disease Maternal Grandfather   . Heart failure Paternal Grandmother     Social History   Tobacco Use  . Smoking status: Never Smoker  . Smokeless tobacco: Never Used  Vaping Use  . Vaping Use: Never used  Substance Use Topics  . Alcohol use: Not Currently    Alcohol/week: 0.0 standard drinks    Comment: rarely  . Drug use: No    Allergies: No Known Allergies  Medications Prior to Admission  Medication Sig Dispense Refill Last Dose  . enoxaparin (LOVENOX) 80 MG/0.8ML  injection Inject 0.8 mLs (80 mg total) into the skin daily. 0.8 mL 6 04/25/2020 at Unknown time  . prenatal vitamin w/FE, FA (PRENATAL 1 + 1) 27-1 MG TABS tablet Take 1 tablet by mouth daily at 12 noon.   04/28/2020 at Unknown time    Review of Systems  Constitutional: Negative.  Negative for fatigue and fever.  HENT: Negative.   Respiratory: Negative.  Negative for shortness of breath.   Cardiovascular: Negative.  Negative for chest pain.  Gastrointestinal: Negative.  Negative for abdominal pain, constipation, diarrhea, nausea and vomiting.  Genitourinary: Positive for vaginal bleeding. Negative for dysuria.  Neurological: Negative.  Negative for dizziness and headaches.   Physical Exam   Blood pressure 126/81, pulse (!) 106, temperature 98.6 F (37 C), temperature source Oral, resp. rate 18, height 5\' 2"  (1.575 m), weight 97.4 kg, last menstrual period 08/04/2019, currently breastfeeding.  Physical Exam Vitals and nursing note reviewed.  Constitutional:      General: She is not in acute distress.    Appearance: She is well-developed.  HENT:  Head: Normocephalic.  Eyes:     Pupils: Pupils are equal, round, and reactive to light.  Cardiovascular:     Rate and Rhythm: Normal rate and regular rhythm.     Heart sounds: Normal heart sounds.  Pulmonary:     Effort: Pulmonary effort is normal. No respiratory distress.     Breath sounds: Normal breath sounds.  Abdominal:     General: Bowel sounds are normal. There is no distension.     Palpations: Abdomen is soft.     Tenderness: There is no abdominal tenderness.  Genitourinary:    Comments: Blood on perineum, inner thighs with small clot at introitus  SSE: small amount of bright red blood in vault removed with fox swab, one small clot removed. Steady trickle of bright red bleeding from cervical os Skin:    General: Skin is warm and dry.  Neurological:     Mental Status: She is alert and oriented to person, place, and time.   Psychiatric:        Behavior: Behavior normal.        Thought Content: Thought content normal.        Judgment: Judgment normal.    Fetal Tracing:  Baseline: 120 Variability: moderate Accels: 15x15 Decels: none  Toco: 1-5  Dilation: 1 Effacement (%): Thick Station: -3 Presentation: Vertex (confirmed by u/s) Exam by:: Ma Hillock, CNM   MAU Course  Procedures  MDM Consulted with Dr. Shawnie Pons regarding bleeding- will admit for IOL IOL methods reviewed with patient. Discussed that patient due to bleeding, she is no longer a candidate for waterbirth.   Pt informed that the ultrasound is considered a limited OB ultrasound and is not intended to be a complete ultrasound exam.  Patient also informed that the ultrasound is not being completed with the intent of assessing for fetal or placental anomalies or any pelvic abnormalities.  Explained that the purpose of today's ultrasound is to assess for  presentation.  Patient acknowledges the purpose of the exam and the limitations of the study.  Vertex presentation confirmed.  Assessment and Plan  -Vaginal bleeding in pregnancy  -Admit to labor and delivery -Report called to S. Briscoe Deutscher and care assumed by labor team  Rolm Bookbinder CNM 04/29/2020, 8:01 AM

## 2020-04-29 NOTE — Progress Notes (Signed)
Maria Brooks is a 29 y.o. G2P1001 at [redacted]w[redacted]d   Subjective: Laboring well on birthing ball. Husband at bedside and supportive  Objective: BP 120/81   Pulse (!) 113   Temp 99.1 F (37.3 C) (Oral)   Resp 16   Ht 5\' 2"  (1.575 m)   Wt 97.4 kg   LMP 08/04/2019   BMI 39.29 kg/m  No intake/output data recorded. No intake/output data recorded.  FHT:  FHR: 130 bpm, variability: moderate,  accelerations:  Present,  decelerations:  Absent UC:   regular, every 2-4 minutes SVE:   Dilation: 6.5 Effacement (%): 70 Station: -1 Exam by:: Weems, MD  Labs: Lab Results  Component Value Date   WBC 9.9 04/29/2020   HGB 9.4 (L) 04/29/2020   HCT 33.0 (L) 04/29/2020   MCV 73.0 (L) 04/29/2020   PLT 215 04/29/2020    Assessment / Plan: --29 y.o. G2P1001 at [redacted]w[redacted]d  --Cat I tracing --GBS +, Ancef initiated at 1205 --S/p foley bulb --Now 6-7 per exam by Dr. [redacted]w[redacted]d --Discussed with patient that course on my shift has been reassuring, but given extent of bleeding this morning I do not consider her a candidate for waterbirth. Discussed other labor coping strategies, position changes. Pt amenable to revised birth plan --Assess for AROM with next exam --Anticipate NSVD  Dorice Lamas, CNM 04/29/2020, 5:04 PM

## 2020-04-29 NOTE — Discharge Summary (Signed)
   Postpartum Discharge Summary    Patient Name: Maria Brooks DOB: 05/23/1991 MRN: [id number]  Date of admission: 04/29/2020 Delivery date:04/29/2020  Delivering provider: EURE, LUTHER H  Date of discharge: 05/01/2020  Admitting diagnosis: Episode of heavy vaginal bleeding [N93.9] Intrauterine pregnancy: [redacted]w[redacted]d     Secondary diagnosis:  Active Problems:   Episode of heavy vaginal bleeding   Placental abruption   Cesarean delivery delivered  Additional problems: Hx DVT    Discharge diagnosis: Term Pregnancy Delivered                                              Post partum procedures: none Augmentation: IP Foley Complications: Placental Abruption  Hospital course: Induction of Labor With Cesarean Section   29 y.o. yo G2P2002 at [redacted]w[redacted]d was admitted to the hospital 04/29/2020 for induction of labor. Patient had a labor course significant for arriving with large amount of vaginal bleeding, and so decision was made to proceed with induction of labor. The patient went for cesarean section due to Non-Reassuring FHR which was found to be due to placental abruption. Delivery details are as follows: Membrane Rupture Time/Date: 10:02 PM ,04/29/2020   Delivery Method:C-Section, Low Transverse  Details of operation can be found in separate operative Note.  Patient had an uncomplicated postpartum course. She is ambulating, tolerating a regular diet, passing flatus, and urinating well.  Patient is discharged home in stable condition on 05/01/20.      Newborn Data: Birth date:04/29/2020  Birth time:10:15 PM  Gender:Female  Living status:Living  Apgars:5 ,9  Weight:3530 g                                 Magnesium Sulfate received: No BMZ received: No Rhophylac:N/A MMR:N/A T-DaP:Given prenatally Flu: No Transfusion:No  Physical exam  Vitals:   04/30/20 0950 04/30/20 1145 04/30/20 1604 04/30/20 2028  BP:  (!) 102/58 92/62 100/65  Pulse:  97 76 71  Resp: 16 16 18 18  Temp:  98.5 F  (36.9 C) 98.5 F (36.9 C) 98.7 F (37.1 C)  TempSrc:  Oral Oral Oral  SpO2: 97% 97%  97%  Weight:      Height:       General: alert, cooperative and no distress Lochia: appropriate Uterine Fundus: firm Incision: Healing well with no significant drainage DVT Evaluation: No evidence of DVT seen on physical exam. Labs: Lab Results  Component Value Date   WBC 16.4 (H) 04/30/2020   HGB 7.7 (L) 04/30/2020   HCT 27.2 (L) 04/30/2020   MCV 71.4 (L) 04/30/2020   PLT 204 04/30/2020   CMP Latest Ref Rng & Units 04/30/2020  Glucose 70 - 99 mg/dL -  BUN 6 - 20 mg/dL -  Creatinine 0.44 - 1.00 mg/dL 0.53  Sodium 135 - 145 mmol/L -  Potassium 3.5 - 5.1 mmol/L -  Chloride 98 - 111 mmol/L -  CO2 22 - 32 mmol/L -  Calcium 8.9 - 10.3 mg/dL -  Total Protein 6.5 - 8.1 g/dL -  Total Bilirubin 0.3 - 1.2 mg/dL -  Alkaline Phos 38 - 126 U/L -  AST 15 - 41 U/L -  ALT 0 - 44 U/L -   Edinburgh Score: Edinburgh Postnatal Depression Scale Screening Tool 04/30/2020  I have been able to laugh   and see the funny side of things. 0  I have looked forward with enjoyment to things. 0  I have blamed myself unnecessarily when things went wrong. 2  I have been anxious or worried for no good reason. 1  I have felt scared or panicky for no good reason. 1  Things have been getting on top of me. 1  I have been so unhappy that I have had difficulty sleeping. 0  I have felt sad or miserable. 0  I have been so unhappy that I have been crying. 1  The thought of harming myself has occurred to me. 0  Edinburgh Postnatal Depression Scale Total 6     After visit meds:  Allergies as of 05/01/2020      Reactions   Penicillins Other (See Comments)   Family history of allergic reaction      Medication List    TAKE these medications   enoxaparin 80 MG/0.8ML injection Commonly known as: LOVENOX Inject 0.8 mLs (80 mg total) into the skin daily.   ibuprofen 800 MG tablet Commonly known as: ADVIL Take 1 tablet  (800 mg total) by mouth every 6 (six) hours.   oxyCODONE-acetaminophen 5-325 MG tablet Commonly known as: PERCOCET/ROXICET Take 1 tablet by mouth every 4 (four) hours as needed for severe pain.   prenatal vitamin w/FE, FA 27-1 MG Tabs tablet Take 1 tablet by mouth daily at 12 noon.        Discharge home in stable condition Infant Feeding: Bottle and Breast Infant Disposition:home with mother Discharge instruction: per After Visit Summary and Postpartum booklet. Activity: Advance as tolerated. Pelvic rest for 6 weeks.  Diet: routine diet Future Appointments: Future Appointments  Date Time Provider Department Center  05/03/2020  1:30 PM Ervin, Michael L, MD [facility]   Follow up Visit:  Follow-up Information    [facility] Follow up.   Specialty: Obstetrics and Gynecology Why: in 1 week for incision check and 4 weeks for a postpartum appointment Contact information: [address] [place] [address] [phone]               Please schedule this patient for a In person postpartum visit in 6 weeks with the following provider: Any provider. Additional Postpartum F/U:Incision check 1 week  High risk pregnancy complicated by: Bleeding disorder on Lovenox and placental abruption Delivery mode:  C-Section, Low Transverse  Anticipated Birth Control:  natural family planning   05/01/2020 Caroline M Neill, CNM   

## 2020-04-29 NOTE — Anesthesia Preprocedure Evaluation (Signed)
Anesthesia Evaluation  Patient identified by MRN, date of birth, ID band Patient awake    Reviewed: Allergy & Precautions, NPO status , Patient's Chart, lab work & pertinent test results  Airway Mallampati: II  TM Distance: >3 FB Neck ROM: Full    Dental no notable dental hx. (+) Teeth Intact, Dental Advisory Given   Pulmonary neg pulmonary ROS,    Pulmonary exam normal breath sounds clear to auscultation       Cardiovascular Exercise Tolerance: Good negative cardio ROS Normal cardiovascular exam Rhythm:Regular Rate:Normal     Neuro/Psych negative neurological ROS  negative psych ROS   GI/Hepatic Neg liver ROS, GERD  ,  Endo/Other    Renal/GU negative Renal ROS     Musculoskeletal negative musculoskeletal ROS (+)   Abdominal (+) + obese,   Peds  Hematology Hx of central venoous sinus thrombosis last lovenox 10/13 Hgb 9.4 Plt 215   Anesthesia Other Findings   Reproductive/Obstetrics (+) Pregnancy                             Anesthesia Physical Anesthesia Plan  ASA: III  Anesthesia Plan: Epidural   Post-op Pain Management:    Induction:   PONV Risk Score and Plan:   Airway Management Planned:   Additional Equipment:   Intra-op Plan:   Post-operative Plan:   Informed Consent: I have reviewed the patients History and Physical, chart, labs and discussed the procedure including the risks, benefits and alternatives for the proposed anesthesia with the patient or authorized representative who has indicated his/her understanding and acceptance.       Plan Discussed with:   Anesthesia Plan Comments: (38.3 Wk G2P1 for LEA)       Anesthesia Quick Evaluation

## 2020-04-29 NOTE — H&P (Signed)
Maria Brooks is a 29 y.o. female presenting for induction of labor following an episode of bleeding at 0530 this morning followed by passing of small clots in MAU. She endorses mild contractions but states they are are not painful. She denies LOF, decreased fetal movement.   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 She states she last took this medicine on Tuesday or Wednesday (10/12 or 10/13).  Patient endorses strong family history of anaphylactic reaction to Penicillin, including her mother and sister. Patient is Penicillin naive and states she forgot to mention this to her prenatal care team.  Prenatal History [facility] Dating by LMP c/w 8 weeks [place] Care initiated at 12w 6 d Low risk female Marginal cord insertion TDAP 02/03/2020  OB History    Gravida  2   Para  1   Term  1   Preterm  0   AB  0   Living  1     SAB  0   TAB  0   Ectopic  0   Multiple  0   Live Births  1           [facility]  LAB RESULTS  Language English Pap 04/30/18 neg  Initiated care at 12wks GC/CT Initial:  -/-          36wks:  Dating by LMP c/w 8wk U/S    Support person Gwenlyn Fudge Genetics NT/IT:  neg   AFP:      NIPS:NML Female     Carrier Screen neg  Flu vaccine  [place]/Hgb Elec neg  TDaP vaccine 02/03/20     Rhogam n/a Blood Type AB/Positive/-- (04/20 1228)    Antibody Negative (04/20 1228)  Anatomy US Normal female HBsAg Negative (04/20 1228)  Feeding Plan breast RPR Non Reactive (04/20 1228)  Contraception NFP Rubella  1.95 (04/20 1228)  Circumcision Yes @ FT HIV Non Reactive (04/20 1228)  Pediatrician [facility] Hep C neg  Prenatal Classes declined      A1C/GTT Early:  5.2    26-28wks: normal  BTL Consent     VBAC Consent n/a GBS       positive  Waterbirth [x] Class [ ] Consent [ ] CNM visit     Past Medical History:  Diagnosis Date  . Allergy   . Chickenpox   . 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  GERD (gastroesophageal reflux disease)    Past Surgical History:  Procedure Laterality Date  . NO PAST SURGERIES    . none     Family History: family history includes Alzheimer's disease in her maternal grandfather; Cancer in her maternal grandmother; Cataracts in her maternal grandmother; Clotting disorder in her mother; Heart disease in her paternal grandfather; Heart failure in her paternal grandmother; Hyperlipidemia in her father; Hypertension in her father; Stroke in her paternal grandfather. Social History:  reports that she has never smoked. She has never used smokeless tobacco. She reports previous alcohol use. She reports that she does not use drugs.  .   Maternal Diabetes: No Genetic Screening: Normal Maternal Ultrasounds/Referrals: Other: marginal cord insertion, low lying placenta (resolved) Fetal Ultrasounds or other Referrals:  Referred to Materal Fetal Medicine  Maternal Substance Abuse:  No Significant Maternal Medications:  Meds include: Other: Lovenox 80 mg daily Significant Maternal Lab Results:  Group B Strep positive Other Comments:  None  Review of Systems  Gastrointestinal: Positive for abdominal pain.  Genitourinary: Positive  for vaginal bleeding.  All other systems reviewed and are negative.  Dilation: 1 Effacement (%): Thick Station: -3 Exam by:: CDruscilla Brownie, CNM Blood pressure 128/73, pulse 90, temperature 97.9 F (36.6 C), temperature source Oral, resp. rate 16, height 5\' 2"  (1.575 m), weight 97.4 kg, last menstrual period 08/04/2019, currently breastfeeding.   Physical Exam Vitals and nursing note reviewed. Exam conducted with a chaperone present.  Cardiovascular:     Rate and Rhythm: Normal rate.     Pulses: Normal pulses.     Heart sounds: Normal heart sounds.  Pulmonary:     Effort: Pulmonary effort is normal.     Breath sounds: Normal breath sounds.  Abdominal:     Comments: Gravid  Skin:    Capillary Refill: Capillary refill takes less than  2 seconds.  Neurological:     Mental Status: She is alert.  Psychiatric:        Mood and Affect: Mood normal.        Behavior: Behavior normal.        Thought Content: Thought content normal.        Judgment: Judgment normal.     Prenatal labs: ABO, Rh: --/--/AB POS (10/16 01-13-2003) Antibody: NEG (10/16 0905) Rubella: 1.95 (04/20 1228) RPR: NON REACTIVE (10/16 0905)  HBsAg: Negative (04/20 1228)  HIV: Non Reactive (07/22 0901)  GBS: Positive/-- (09/30 1530)   Fetal Tracing Cat I tracing Baseline 130, mod var, + 15 x 15 accels, no decels Toco: regular ctx q 2 min  Assessment/Plan:  Labor --Spontaneous cervical change from 1/thick in MAU to 2/60% on L&D --Foley balloon placed without difficulty --Cervix very soft and pliable, active contractions, Cytotec withheld --No bleeding noted during foley placement --EFW 50% at 36+6  GBS + --Family history of anaphylaxis, no sensitivities, patient PCN naive --Discussed with Dr. 11-14-2005, will manage with Ancef q 5 hours  Hx Cerebral Venous Sinus Thrombosis --Last took Lovenox Tues or Wednesday    Boy(outpt)/breast/NFP  Friday, CNM 04/29/2020, 12:13 PM

## 2020-04-29 NOTE — Progress Notes (Signed)
Maria Brooks is a 29 y.o. G2P1001 at [redacted]w[redacted]d   Subjective: Endorsing recurrent painful contractions, performing hip circles at bedside on birthing ball  Objective: BP 128/73   Pulse 90   Temp 97.9 F (36.6 C) (Oral)   Resp 16   Ht 5\' 2"  (1.575 m)   Wt 97.4 kg   LMP 08/04/2019   BMI 39.29 kg/m  No intake/output data recorded. No intake/output data recorded.  FHT:  FHR: 135 bpm, variability: moderate,  accelerations:  Present,  decelerations:  Absent UC:   regular, every 2-3 minutes SVE:   Dilation: 1 Effacement (%): Thick Station: -3 Exam by:: [email or web address], CNM  Labs: Lab Results  Component Value Date   WBC 9.9 04/29/2020   HGB 9.4 (L) 04/29/2020   HCT 33.0 (L) 04/29/2020   MCV 73.0 (L) 04/29/2020   PLT 215 04/29/2020    Assessment / Plan: --29 y.o. G2P1001 at [redacted]w[redacted]d  --GBS +, Ancef q 6 --Cat I tracing --S/p foley balloon --Reassuring labor status, no bleeding or other acute events since admission --Patient has not eaten since 8pm last night, light labor diet x 1 meal --Will check cervix once patient is finished eating, consider Pitocin augmentation PRN --Anticipate NSVD  [redacted]w[redacted]d, CNM 04/29/2020, 2:27 PM

## 2020-04-29 NOTE — MAU Note (Signed)
PT SAYS AT 0530- WENT TO B-ROOM - HAD BRIGHT RED BLEEDING . ON PAD IN TRIAGE - RED - SMALL AMT.  FEELS BABY MOVING . [facility]. 2 WEEKS AGO - VE  CLOSED .  DENIES HSV AND MRSA. Marland Kitchen FEELS SOME UC'S.

## 2020-04-29 NOTE — Anesthesia Procedure Notes (Signed)
Epidural Patient location during procedure: OB Start time: 04/29/2020 8:15 PM End time: 04/29/2020 8:32 PM  Staffing Anesthesiologist: Trevor Iha, MD Performed: anesthesiologist   Preanesthetic Checklist Completed: patient identified, IV checked, site marked, risks and benefits discussed, surgical consent, monitors and equipment checked, pre-op evaluation and timeout performed  Epidural Patient position: sitting Prep: DuraPrep and site prepped and draped Patient monitoring: continuous pulse ox and blood pressure Approach: midline Location: L3-L4 Injection technique: LOR air  Needle:  Needle type: Tuohy  Needle gauge: 17 G Needle length: 9 cm and 9 Needle insertion depth: 9 cm Catheter type: closed end flexible Catheter size: 19 Gauge Catheter at skin depth: 15 cm Test dose: negative  Assessment Events: blood not aspirated, injection not painful, no injection resistance, no paresthesia and negative IV test  Additional Notes Patient identified. Risks/Benefits/Options discussed with patient including but not limited to bleeding, infection, nerve damage, paralysis, failed block, incomplete pain control, headache, blood pressure changes, nausea, vomiting, reactions to medication both or allergic, itching and postpartum back pain. Confirmed with bedside nurse the patient's most recent platelet count. Confirmed with patient that they are not currently taking any anticoagulation, have any bleeding history or any family history of bleeding disorders. Patient expressed understanding and wished to proceed. All questions were answered. Sterile technique was used throughout the entire procedure. Please see nursing notes for vital signs. Test dose was given through epidural needle and negative prior to continuing to dose epidural or start infusion. Warning signs of high block given to the patient including shortness of breath, tingling/numbness in hands, complete motor block, or any  concerning symptoms with instructions to call for help. Patient was given instructions on fall risk and not to get out of bed. All questions and concerns addressed with instructions to call with any issues. 1 Attempt (S) . Patient tolerated procedure well.

## 2020-04-29 NOTE — Op Note (Addendum)
Preoperative diagnosis:  1.  Intrauterine pregnancy at 38 weeks 3 days gestation                                         2.  Fetal bradycardia, 15 minutes                                          3.  Suspected placental abruption   Postoperative diagnosis:  Same as above, placental abruption   Procedure:  Primary cesarean section, stat Caesarean  Surgeon:  Lazaro Arms MD  Assistant:  Casper Harrison MD   Anesthesia: Epidrual  Brief Summary of preceding events: Called to room due to fetal heart rate in 60's despite repositioning, bolus. Patient not on pitocin. She had recently had epidural over one hour ago. Dr. Despina Hidden called to bedside. Phenylephrine administered without improvement. Patient AROM'd and FSE placed by Dr. Despina Hidden. No improvement in heart rate and decision made to proceed with Code Cesarean.   Findings:  .    Over a low transverse incision was delivered a viable female with Apgars of 5 and 9, Weight pending. 50% placental abruption noted. Uterus, tubes and ovaries were all normal.  There were no other significant findings  Description of operation:  Anesthesia determined to be adequate with already placed epidural. Foley placed prior to arrival in OR. She was prepped with splash of betadine by Dr Despina Hidden and draped in usual sterile fashion. A Pfannenstiel skin incision was made and carried down sharply to the rectus fascia which was scored in the midline extended laterally. The fascia was taken off the muscles both superiorly and without difficulty. The muscles were divided.  The peritoneal cavity was entered.  Bladder blade was placed, no bladder flap was created.  A low transverse hysterotomy incision was made and delivered a viable female  infant with Apgars of 5 and 9. Copious amounts of blood noted on entry to uterus and placental abruption identified.Cord pH was obtained and was 7.048 (pCO2 80.5, Bicarb 21.1). The uterus was exteriorized. It was closed in 2 layers, the first being a  running interlocking layer and the second being an imbricating layer using 0 monocryl on a CTX needle. There was good resulting hemostasis. The uterus tubes and ovaries were all normal. Peritoneal cavity was irrigated vigorously. The muscles and peritoneum were reapproximated loosely. The fascia was closed using 0 Vicryl in running fashion. Subcutaneous tissue was made hemostatic and irrigated. The skin was closed using 4-0 Vicryl on a Keith needle in a subcuticular fashion.  Dermabond was placed for additional wound integrity and to serve as a barrier. Blood loss for the procedure was 289 cc. The patient was taken to the recovery room in good stable condition with all counts being correct x3.  EBL 289 cc  Arlana Pouch [facility] 04/29/2020 11:26 PM

## 2020-04-30 ENCOUNTER — Encounter (HOSPITAL_COMMUNITY): Payer: Self-pay | Admitting: Obstetrics & Gynecology

## 2020-04-30 DIAGNOSIS — O4590 Premature separation of placenta, unspecified, unspecified trimester: Secondary | ICD-10-CM

## 2020-04-30 LAB — CBC
HCT: 27.2 % — ABNORMAL LOW (ref 36.0–46.0)
Hemoglobin: 7.7 g/dL — ABNORMAL LOW (ref 12.0–15.0)
MCH: 20.2 pg — ABNORMAL LOW (ref 26.0–34.0)
MCHC: 28.3 g/dL — ABNORMAL LOW (ref 30.0–36.0)
MCV: 71.4 fL — ABNORMAL LOW (ref 80.0–100.0)
Platelets: 204 10*3/uL (ref 150–400)
RBC: 3.81 MIL/uL — ABNORMAL LOW (ref 3.87–5.11)
RDW: 17.2 % — ABNORMAL HIGH (ref 11.5–15.5)
WBC: 16.4 10*3/uL — ABNORMAL HIGH (ref 4.0–10.5)
nRBC: 0 % (ref 0.0–0.2)

## 2020-04-30 LAB — CREATININE, SERUM
Creatinine, Ser: 0.53 mg/dL (ref 0.44–1.00)
GFR, Estimated: >60 mL/min (ref >60)

## 2020-04-30 MED ORDER — KETOROLAC TROMETHAMINE 30 MG/ML IJ SOLN
30.0000 mg | Freq: Once | INTRAMUSCULAR | Status: AC
  Administered 2020-04-30: 30 mg via INTRAVENOUS

## 2020-04-30 MED ORDER — LACTATED RINGERS IV SOLN: INTRAVENOUS | Status: DC

## 2020-04-30 MED ORDER — KETOROLAC TROMETHAMINE 30 MG/ML IJ SOLN
30.0000 mg | Freq: Four times a day (QID) | INTRAMUSCULAR | Status: AC
  Administered 2020-04-30 (×4): 30 mg via INTRAVENOUS
  Filled 2020-04-30 (×4): qty 1

## 2020-04-30 MED ORDER — ONDANSETRON HCL 4 MG/2ML IJ SOLN
INTRAMUSCULAR | Status: AC
  Administered 2020-04-30: 4 mg via INTRAVENOUS
  Filled 2020-04-30: qty 2

## 2020-04-30 MED ORDER — NALOXONE HCL 4 MG/10ML IJ SOLN
1.0000 ug/kg/h | INTRAVENOUS | Status: DC | PRN
  Filled 2020-04-30: qty 5

## 2020-04-30 MED ORDER — OXYTOCIN-SODIUM CHLORIDE 30-0.9 UT/500ML-% IV SOLN: 2.5000 [IU]/h | INTRAVENOUS | Status: AC

## 2020-04-30 MED ORDER — ACETAMINOPHEN 500 MG PO TABS
1000.0000 mg | ORAL_TABLET | Freq: Three times a day (TID) | ORAL | Status: DC
  Administered 2020-04-30 – 2020-05-02 (×7): 1000 mg via ORAL
  Filled 2020-04-30 (×7): qty 2

## 2020-04-30 MED ORDER — KETOROLAC TROMETHAMINE 30 MG/ML IJ SOLN
INTRAMUSCULAR | Status: AC
  Filled 2020-04-30: qty 1

## 2020-04-30 MED ORDER — ONDANSETRON HCL 4 MG/2ML IJ SOLN: 4.0000 mg | Freq: Three times a day (TID) | INTRAMUSCULAR | Status: DC | PRN

## 2020-04-30 MED ORDER — SCOPOLAMINE 1 MG/3DAYS TD PT72
1.0000 | MEDICATED_PATCH | Freq: Once | TRANSDERMAL | Status: DC
  Administered 2020-04-30: 1.5 mg via TRANSDERMAL
  Filled 2020-04-30: qty 1

## 2020-04-30 MED ORDER — NALOXONE HCL 0.4 MG/ML IJ SOLN: 0.4000 mg | INTRAMUSCULAR | Status: DC | PRN

## 2020-04-30 MED ORDER — SODIUM CHLORIDE 0.9 % IV SOLN: 510.0000 mg | Freq: Once | INTRAVENOUS | Status: DC

## 2020-04-30 MED ORDER — MENTHOL 3 MG MT LOZG: 1.0000 | LOZENGE | OROMUCOSAL | Status: DC | PRN

## 2020-04-30 MED ORDER — DIPHENHYDRAMINE HCL 50 MG/ML IJ SOLN: 12.5000 mg | INTRAMUSCULAR | Status: DC | PRN

## 2020-04-30 MED ORDER — SODIUM CHLORIDE 0.9 % IV SOLN
500.0000 mg | Freq: Once | INTRAVENOUS | Status: AC
  Administered 2020-04-30: 500 mg via INTRAVENOUS
  Filled 2020-04-30: qty 25

## 2020-04-30 MED ORDER — COCONUT OIL OIL: 1.0000 "application " | TOPICAL_OIL | Status: DC | PRN

## 2020-04-30 MED ORDER — ENOXAPARIN SODIUM 40 MG/0.4ML ~~LOC~~ SOLN: 40.0000 mg | SUBCUTANEOUS | Status: DC

## 2020-04-30 MED ORDER — NALBUPHINE HCL 10 MG/ML IJ SOLN: 5.0000 mg | Freq: Once | INTRAMUSCULAR | Status: DC | PRN

## 2020-04-30 MED ORDER — SIMETHICONE 80 MG PO CHEW
80.0000 mg | CHEWABLE_TABLET | ORAL | Status: DC
  Administered 2020-04-30: 80 mg via ORAL
  Filled 2020-04-30: qty 1

## 2020-04-30 MED ORDER — IBUPROFEN 800 MG PO TABS
800.0000 mg | ORAL_TABLET | Freq: Four times a day (QID) | ORAL | Status: DC
  Administered 2020-05-01 – 2020-05-02 (×5): 800 mg via ORAL
  Filled 2020-04-30 (×5): qty 1

## 2020-04-30 MED ORDER — ENOXAPARIN SODIUM 60 MG/0.6ML ~~LOC~~ SOLN
50.0000 mg | SUBCUTANEOUS | Status: DC
  Administered 2020-04-30 – 2020-05-01 (×2): 50 mg via SUBCUTANEOUS
  Filled 2020-04-30 (×2): qty 0.6

## 2020-04-30 MED ORDER — OXYCODONE HCL 5 MG PO TABS
5.0000 mg | ORAL_TABLET | ORAL | Status: DC | PRN
  Administered 2020-05-01: 5 mg via ORAL
  Filled 2020-04-30: qty 1

## 2020-04-30 MED ORDER — SIMETHICONE 80 MG PO CHEW
80.0000 mg | CHEWABLE_TABLET | Freq: Three times a day (TID) | ORAL | Status: DC
  Administered 2020-04-30 – 2020-05-01 (×4): 80 mg via ORAL
  Filled 2020-04-30 (×5): qty 1

## 2020-04-30 MED ORDER — SODIUM CHLORIDE 0.9% FLUSH: 3.0000 mL | INTRAVENOUS | Status: DC | PRN

## 2020-04-30 MED ORDER — WITCH HAZEL-GLYCERIN EX PADS: 1.0000 "application " | MEDICATED_PAD | CUTANEOUS | Status: DC | PRN

## 2020-04-30 MED ORDER — DIPHENHYDRAMINE HCL 25 MG PO CAPS: 25.0000 mg | ORAL_CAPSULE | ORAL | Status: DC | PRN

## 2020-04-30 MED ORDER — NALBUPHINE HCL 10 MG/ML IJ SOLN: 5.0000 mg | INTRAMUSCULAR | Status: DC | PRN

## 2020-04-30 MED ORDER — SIMETHICONE 80 MG PO CHEW: 80.0000 mg | CHEWABLE_TABLET | ORAL | Status: DC | PRN

## 2020-04-30 MED ORDER — PRENATAL MULTIVITAMIN CH
1.0000 | ORAL_TABLET | Freq: Every day | ORAL | Status: DC
  Administered 2020-05-01: 1 via ORAL
  Filled 2020-04-30 (×2): qty 1

## 2020-04-30 MED ORDER — TETANUS-DIPHTH-ACELL PERTUSSIS 5-2.5-18.5 LF-MCG/0.5 IM SUSP: 0.5000 mL | Freq: Once | INTRAMUSCULAR | Status: DC

## 2020-04-30 MED ORDER — DIBUCAINE (PERIANAL) 1 % EX OINT: 1.0000 "application " | TOPICAL_OINTMENT | CUTANEOUS | Status: DC | PRN

## 2020-04-30 MED ORDER — NALBUPHINE HCL 10 MG/ML IJ SOLN
5.0000 mg | INTRAMUSCULAR | Status: DC | PRN
  Administered 2020-04-30: 5 mg via INTRAVENOUS
  Filled 2020-04-30: qty 1

## 2020-04-30 MED ORDER — SENNOSIDES-DOCUSATE SODIUM 8.6-50 MG PO TABS
2.0000 | ORAL_TABLET | ORAL | Status: DC
  Administered 2020-04-30 – 2020-05-02 (×3): 2 via ORAL
  Filled 2020-04-30 (×3): qty 2

## 2020-04-30 MED ORDER — DIPHENHYDRAMINE HCL 25 MG PO CAPS: 25.0000 mg | ORAL_CAPSULE | Freq: Four times a day (QID) | ORAL | Status: DC | PRN

## 2020-04-30 NOTE — Progress Notes (Signed)
POSTPARTUM PROGRESS NOTE  Subjective: Maria Brooks is a 29 y.o. G2P2002 s/p LTCS at [redacted]w[redacted]d.  She reports she doing well. No acute events overnight. She denies any problems with ambulating, voiding or po intake. Foley in place. Denies nausea or vomiting. She has not passed flatus. Pain is well controlled.  Lochia is minimal.  Objective: Blood pressure 100/65, pulse 71, temperature 98.7 F (37.1 C), temperature source Oral, resp. rate 18, height 5\' 2"  (1.575 m), weight 97.4 kg, last menstrual period 08/04/2019, SpO2 97 %, unknown if currently breastfeeding.  Physical Exam:  General: alert, cooperative and no distress Chest: no respiratory distress Abdomen: soft, non-tender  Uterine Fundus: firm and at level of umbilicus Extremities: No calf swelling or tenderness  trace LE edema bilaterally  Recent Labs    04/29/20 0905 04/30/20 0347  HGB 9.4* 7.7*  HCT 33.0* 27.2*    Assessment/Plan: Maria Brooks is a 29 y.o. G2P2002 s/p LTCS at [redacted]w[redacted]d for placental abruption.  Routine Postpartum Care: Doing well, pain well-controlled.  -- Continue routine care, lactation support  -- Contraception: natural family planning -- Feeding: breast -- h/o DVT: plan to resume home lovenox 80mg  daily on POD#2. --Anemia: Hgb 9.4 >7.7 >IV venafer today Dispo: Plan for discharge POD#2-3.  [redacted]w[redacted]d, MD OB Fellow, [facility] 04/30/2020 10:21 PM

## 2020-04-30 NOTE — Anesthesia Postprocedure Evaluation (Signed)
Anesthesia Post Note  Patient: Maria Brooks  Procedure(s) Performed: CESAREAN SECTION     Patient location during evaluation: Mother Baby Anesthesia Type: Epidural Level of consciousness: oriented and awake and alert Pain management: pain level controlled Vital Signs Assessment: post-procedure vital signs reviewed and stable Respiratory status: spontaneous breathing and respiratory function stable Cardiovascular status: blood pressure returned to baseline and stable Postop Assessment: no headache, no backache, no apparent nausea or vomiting and able to ambulate Anesthetic complications: no   No complications documented.  Last Vitals:  Vitals:   04/29/20 2345 04/30/20 0000  BP: 101/69 101/68  Pulse: 96 87  Resp: 15 16  Temp:    SpO2: 100% 100%    Last Pain:  Vitals:   04/30/20 0000  TempSrc:   PainSc: 0-No pain   Pain Goal:    LLE Motor Response: Purposeful movement (04/30/20 0000) LLE Sensation: Tingling (04/30/20 0000) RLE Motor Response: Purposeful movement (04/30/20 0000) RLE Sensation: Tingling (04/30/20 0000)     Epidural/Spinal Function Cutaneous sensation: Tingles (04/30/20 0000), Patient able to flex knees: Yes (04/30/20 0000), Patient able to lift hips off bed: Yes (04/30/20 0000), Back pain beyond tenderness at insertion site: No (04/30/20 0000), Progressively worsening motor and/or sensory loss: No (04/30/20 0000), Bowel and/or bladder incontinence post epidural: No (04/30/20 0000)  Trevor Iha

## 2020-04-30 NOTE — Lactation Note (Signed)
This note was copied from a baby's chart. Lactation Consultation Note  Patient Name: Maria Brooks [id number] Date: 04/30/2020 Reason for consult: Initial assessment;Early term 39-38.6wks  Baby is 49 hours old , experienced Breast feeder of 1 year.  As LC entered the room per mom its about time to feed the baby.  LC offered with orientee to check the diaper and baby had a large wet while  Changing a large black to mec loose stool.  In the mean time LC reviewed hand expressing and mom was independent with  Expressing several drops.  Mom preferred using the boppy pillow and cross cradle and after laid the baby next to her and she worked on taping upper lip and baby eventually latched with  Depth, and increased swallows noted and per mom comfortable. Baby still feeding at 8 minutes.  Per mom has a DEBP at home and a HAKKA hand pump.  LC provided her with the [facility] pamphlet with resource numbers and website for support group.    Maternal Data Has patient been taught Hand Expression?: No (mom is an experienced BF of 1 year and was able to hand express with ease) Does the patient have breastfeeding experience prior to this delivery?: Yes  Feeding Feeding Type: Breast Fed  LATCH Score Latch: Grasps breast easily, tongue down, lips flanged, rhythmical sucking.  Audible Swallowing: Spontaneous and intermittent  Type of Nipple: Everted at rest and after stimulation  Comfort (Breast/Nipple): Soft / non-tender  Hold (Positioning): Assistance needed to correctly position infant at breast and maintain latch.  LATCH Score: 9  Interventions Interventions: Breast feeding basics reviewed;Assisted with latch;Skin to skin;Breast compression;Adjust position;Support pillows;Position options  Lactation Tools Discussed/Used WIC Program: No   Consult Status Consult Status: Follow-up Date: 05/01/20 Follow-up type: In-patient    Maria Brooks 04/30/2020, 2:57 PM

## 2020-05-01 MED ORDER — OXYCODONE-ACETAMINOPHEN 5-325 MG PO TABS: 1.0000 | ORAL_TABLET | ORAL | 0 refills | Status: DC | PRN

## 2020-05-01 MED ORDER — IBUPROFEN 800 MG PO TABS: 800.0000 mg | ORAL_TABLET | Freq: Four times a day (QID) | ORAL | 0 refills | Status: DC

## 2020-05-01 MED ORDER — INFLUENZA VAC SPLIT QUAD 0.5 ML IM SUSY
0.5000 mL | PREFILLED_SYRINGE | INTRAMUSCULAR | Status: AC | PRN
  Administered 2020-05-01: 0.5 mL via INTRAMUSCULAR
  Filled 2020-05-01: qty 0.5

## 2020-05-01 NOTE — Discharge Instructions (Signed)

## 2020-05-01 NOTE — Lactation Note (Signed)
This note was copied from a baby's chart. Lactation Consultation Note  Patient Name: Maria Brooks Date: 05/01/2020   Infant is 36 hrs old. "Maria Brooks" lost 225 g between his last 2 weights, but he also had 6 voids & 8 stools during that time period. I assisted with latching infant using the teacup hold. He latched with relative ease. Swallows were noted & verified by cervical auscultation. His suck:swallow ratio was 1:1.   I gave Mom anticipatory guidance about trying side-lying position once she has healed more from her CSection, especially if she notices that Maria Brooks ever pulls off b/c of the flow, etc.  Parents know how to get in touch with [place] for any post-dc questions.   Lurline Hare [facility] 05/01/2020, 10:32 AM

## 2020-05-02 LAB — SURGICAL PATHOLOGY

## 2020-05-02 MED ORDER — ACETAMINOPHEN 500 MG PO TABS: 1000.0000 mg | ORAL_TABLET | Freq: Three times a day (TID) | ORAL | Status: DC | PRN

## 2020-05-02 MED ORDER — FERROUS SULFATE 325 (65 FE) MG PO TABS: 325.0000 mg | ORAL_TABLET | ORAL | 1 refills | Status: DC

## 2020-05-02 MED ORDER — COCONUT OIL OIL: 1.0000 "application " | TOPICAL_OIL | 0 refills | Status: DC | PRN

## 2020-05-02 MED ORDER — IBUPROFEN 600 MG PO TABS
600.0000 mg | ORAL_TABLET | Freq: Four times a day (QID) | ORAL | 0 refills | Status: DC | PRN
Start: 2020-05-02 — End: 2020-06-05

## 2020-05-02 MED ORDER — ENOXAPARIN SODIUM 80 MG/0.8ML ~~LOC~~ SOLN: 80.0000 mg | SUBCUTANEOUS | Status: DC

## 2020-05-02 MED ORDER — OXYCODONE HCL 5 MG PO TABS
5.0000 mg | ORAL_TABLET | Freq: Four times a day (QID) | ORAL | 0 refills | Status: DC | PRN
Start: 2020-05-02 — End: 2020-06-05

## 2020-05-02 NOTE — Lactation Note (Signed)
This note was copied from a baby's chart. Lactation Consultation Note  Patient Name: Maria Brooks [id number] Date: 05/02/2020  Parents are preparing to go home. Mom has no questions or concerns regarding breastfeeding.   Lurline Hare [facility] 05/02/2020, 10:32 AM

## 2020-05-02 NOTE — Lactation Note (Signed)
This note was copied from a baby's chart. Lactation Consultation Note  Patient Name: Maria Brooks [id number] Date: 05/02/2020   [facility] visit attempted, but pediatrician in room. Infant noted to have only lost 40 g over the last 24 hrs. Lactation to return.   Lurline Hare [facility] 05/02/2020, 8:47 AM

## 2020-05-02 NOTE — Discharge Summary (Addendum)
Postpartum Discharge Summary    Patient Name: Maria Brooks DOB: 04/04/1991 MRN: [id number]  Date of admission: 04/29/2020 Delivery date:04/29/2020  Delivering provider: Florian Buff  Date of discharge: 05/02/2020  Admitting diagnosis: Episode of heavy vaginal bleeding [N93.9] Intrauterine pregnancy: [redacted]w[redacted]d    Secondary diagnosis:  Active Problems:   Episode of heavy vaginal bleeding   Placental abruption   Cesarean delivery delivered  Additional problems: Hx DVT    Discharge diagnosis: Term Pregnancy Delivered                                              Post partum procedures: none Augmentation: IP Foley Complications: Placental Abruption  Hospital course: Induction of Labor With Cesarean Section   29y.o. yo G2P2002 at 364w3das admitted to the hospital 04/29/2020 for induction of labor. Patient had a labor course significant for arriving with large amount of vaginal bleeding, and so decision was made to proceed with induction of labor. The patient went for cesarean section due to Non-Reassuring FHR which was found to be due to placental abruption. Delivery details are as follows: Membrane Rupture Time/Date: 10:02 PM ,04/29/2020   Delivery Method:C-Section, Low Transverse  Details of operation can be found in separate operative Note.  Patient had an uncomplicated postpartum course. She is ambulating, tolerating a regular diet, passing flatus, and urinating well.  Patient is discharged home in stable condition on 05/02/20.      Newborn Data: Birth date:04/29/2020  Birth time:10:15 PM  Gender:Female  Living status:Living  Apgars:5 ,9  Weight:3530 g                                 Magnesium Sulfate received: No BMZ received: No Rhophylac:N/A MMR:N/A T-DaP:Given prenatally Flu: No Transfusion:No  Physical exam  Vitals:   05/01/20 0500 05/01/20 1804 05/01/20 2121 05/02/20 0542  BP: 110/68 102/66 103/63 (!) 93/55  Pulse: 70 76 90 86  Resp: _0 Temp: 97.9 F  (36.6 C) 97.8 F (36.6 C) 99.1 F (37.3 C) 98.4 F (36.9 C)  TempSrc: Oral Oral Oral Oral  SpO2:  98% 99%   Weight:      Height:       General: alert, cooperative and no distress Lochia: appropriate Uterine Fundus: firm Incision: Healing well with no significant drainage DVT Evaluation: No evidence of DVT seen on physical exam. Labs: Lab Results  Component Value Date   WBC 16.4 (H) 04/30/2020   HGB 7.7 (L) 04/30/2020   HCT 27.2 (L) 04/30/2020   MCV 71.4 (L) 04/30/2020   PLT 204 04/30/2020   CMP Latest Ref Rng & Units 04/30/2020  Glucose 70 - 99 mg/dL -  BUN 6 - 20 mg/dL -  Creatinine 0.44 - 1.00 mg/dL 0.53  Sodium 135 - 145 mmol/L -  Potassium 3.5 - 5.1 mmol/L -  Chloride 98 - 111 mmol/L -  CO2 22 - 32 mmol/L -  Calcium 8.9 - 10.3 mg/dL -  Total Protein 6.5 - 8.1 g/dL -  Total Bilirubin 0.3 - 1.2 mg/dL -  Alkaline Phos 38 - 126 U/L -  AST 15 - 41 U/L -  ALT 0 - 44 U/L -   Edinburgh Score: Edinburgh Postnatal Depression Scale Screening Tool 04/30/2020  I have been able to laugh  and see the funny side of things. 0  I have looked forward with enjoyment to things. 0  I have blamed myself unnecessarily when things went wrong. 2  I have been anxious or worried for no good reason. 1  I have felt scared or panicky for no good reason. 1  Things have been getting on top of me. 1  I have been so unhappy that I have had difficulty sleeping. 0  I have felt sad or miserable. 0  I have been so unhappy that I have been crying. 1  The thought of harming myself has occurred to me. 0  Edinburgh Postnatal Depression Scale Total 6     After visit meds:  Allergies as of 05/02/2020       Reactions   Penicillins Other (See Comments)   Family history of allergic reaction        Medication List     TAKE these medications    acetaminophen 500 MG tablet Commonly known as: TYLENOL Take 2 tablets (1,000 mg total) by mouth every 8 (eight) hours as needed.   coconut oil  Oil Apply 1 application topically as needed (nipple pain).   enoxaparin 80 MG/0.8ML injection Commonly known as: LOVENOX Inject 0.8 mLs (80 mg total) into the skin daily.   ferrous sulfate 325 (65 FE) MG tablet Take 1 tablet (325 mg total) by mouth every other day.   ibuprofen 600 MG tablet Commonly known as: ADVIL Take 1 tablet (600 mg total) by mouth every 6 (six) hours as needed.   oxyCODONE 5 MG immediate release tablet Commonly known as: Oxy IR/ROXICODONE Take 1-2 tablets (5-10 mg total) by mouth every 6 (six) hours as needed for severe pain or breakthrough pain.   prenatal vitamin w/FE, FA 27-1 MG Tabs tablet Take 1 tablet by mouth daily at 12 noon.         Discharge home in stable condition Infant Feeding: Bottle and Breast Infant Disposition:home with mother Discharge instruction: per After Visit Summary and Postpartum booklet. Activity: Advance as tolerated. Pelvic rest for 6 weeks.  Diet: routine diet Future Appointments: Future Appointments  Date Time Provider [place]  05/16/2020 10:50 AM Florian Buff, MD [facility]  06/05/2020  1:50 PM Roma Schanz, CNM [facility]   Follow up Visit:  Follow-up Information     [facility] Follow up.   Specialty: Obstetrics and Gynecology Why: in 1 week for incision check and 4 weeks for a postpartum appointment Contact information: [address] [place] [phone]                 Please schedule this patient for a In person postpartum visit in 6 weeks with the following provider: Any provider. Additional Postpartum F/U:Incision check 1 week  High risk pregnancy complicated by: Bleeding disorder on Lovenox and placental abruption Delivery mode:  C-Section, Low Transverse  Anticipated Birth Control:   natural family planning   05/02/2020 Baldo Ash, MD   I personally saw and evaluated the patient, performing the key elements of  the service. I developed and verified the management plan that is described in the resident's/student's note, and I agree with the content with my edits above. VSS, HRR&R, Resp unlabored, Legs neg.  Nigel Berthold, CNM 05/04/2020 9:13 AM

## 2020-05-03 ENCOUNTER — Encounter: Payer: BC Managed Care – PPO | Admitting: Obstetrics and Gynecology

## 2020-05-10 ENCOUNTER — Inpatient Hospital Stay (HOSPITAL_COMMUNITY): Admit: 2020-05-10 | Payer: Self-pay

## 2020-05-16 ENCOUNTER — Ambulatory Visit (INDEPENDENT_AMBULATORY_CARE_PROVIDER_SITE_OTHER): Payer: BC Managed Care – PPO | Admitting: Obstetrics & Gynecology

## 2020-05-16 ENCOUNTER — Encounter: Payer: Self-pay | Admitting: Obstetrics & Gynecology

## 2020-05-16 VITALS — BP 117/85 | HR 101 | Ht 62.0 in | Wt 196.0 lb

## 2020-05-16 DIAGNOSIS — Z98891 History of uterine scar from previous surgery: Secondary | ICD-10-CM

## 2020-05-16 NOTE — Progress Notes (Signed)
  HPI: Patient returns for routine postoperative follow-up having undergone repeat C section  on 04/29/20.  The patient's immediate postoperative recovery has been unremarkable. Since hospital discharge the patient reports no problems.   Current Outpatient Medications: enoxaparin (LOVENOX) 80 MG/0.8ML injection, Inject 0.8 mLs (80 mg total) into the skin daily., Disp: 0.8 mL, Rfl: 6 ferrous sulfate 325 (65 FE) MG tablet, Take 1 tablet (325 mg total) by mouth every other day., Disp: 30 tablet, Rfl: 1 prenatal vitamin w/FE, FA (PRENATAL 1 + 1) 27-1 MG TABS tablet, Take 1 tablet by mouth daily at 12 noon., Disp: , Rfl:  acetaminophen (TYLENOL) 500 MG tablet, Take 2 tablets (1,000 mg total) by mouth every 8 (eight) hours as needed. (Patient not taking: Reported on 05/16/2020), Disp: , Rfl:  coconut oil OIL, Apply 1 application topically as needed (nipple pain). (Patient not taking: Reported on 05/16/2020), Disp: , Rfl: 0 ibuprofen (ADVIL) 600 MG tablet, Take 1 tablet (600 mg total) by mouth every 6 (six) hours as needed. (Patient not taking: Reported on 05/16/2020), Disp: 30 tablet, Rfl: 0 oxyCODONE (OXY IR/ROXICODONE) 5 MG immediate release tablet, Take 1-2 tablets (5-10 mg total) by mouth every 6 (six) hours as needed for severe pain or breakthrough pain. (Patient not taking: Reported on 05/16/2020), Disp: 12 tablet, Rfl: 0  No current facility-administered medications for this visit.    Blood pressure 117/85, pulse (!) 101, height 5\' 2"  (1.575 m), weight 196 lb (88.9 kg), currently breastfeeding.  Physical Exam: Incision clean dry intact Honeycomb dressing removed Abdomen is soft benign  Diagnostic Tests:   Pathology:   Impression: S/p emergency Caesarean section Normal post op course  Plan: 4 weeks pp visit  Follow up: 4  weeks  , MD

## 2020-06-05 ENCOUNTER — Other Ambulatory Visit: Payer: Self-pay

## 2020-06-05 ENCOUNTER — Ambulatory Visit (INDEPENDENT_AMBULATORY_CARE_PROVIDER_SITE_OTHER): Payer: BC Managed Care – PPO | Admitting: Women's Health

## 2020-06-05 ENCOUNTER — Encounter: Payer: Self-pay | Admitting: Women's Health

## 2020-06-05 NOTE — Progress Notes (Signed)
POSTPARTUM VISIT Patient name: Maria Brooks MRN [id number]  Date of birth: 1990/10/19 Chief Complaint:   Postpartum Care  History of Present Illness:   Maria Brooks is a 29 y.o. G36P2002 Caucasian female being seen today for a postpartum visit. She is 5 weeks postpartum following a primary cesarean section, low transverse incision at 38.3 gestational weeks d/t fetal brady w/ 50% placental abruption noted at time of c/s. IOL: Yes, for vaginal bleeding. Anesthesia: epidural.  Laceration: n/a.  Complications: none. Inpatient contraception: no.   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 Substance use disorder: no. Last pap smear: 04/30/18 and results were normal. Next pap smear due: 2022 No LMP recorded. (Menstrual status: Lactating).  Postpartum course has been uncomplicated. Bleeding thin lochia. Bowel function is normal. Bladder function is normal. Urinary incontinence? No, fecal incontinence? No Patient is not sexually active. Last sexual activity: prior to birth of baby.  Desired contraception: NFP. Patient does want a pregnancy in the future.  Desired family size is 3-4 children.   Upstream - 06/05/20 1353      Pregnancy Intention Screening   Does the patient want to become pregnant in the next year? No    Does the patient's partner want to become pregnant in the next year? No    Would the patient like to discuss contraceptive options today? No      Contraception Wrap Up   Current Method No Contraceptive Precautions    End Method No Contraception Precautions   natural family planning   Contraception Counseling Provided Yes          The pregnancy intention screening data noted above was reviewed. Potential methods of contraception were discussed. The patient elected to proceed with FAM or LAM.   Flavia Shipper Postpartum Depression Screening: negative  Edinburgh Postnatal Depression Scale - 06/05/20 1354       Edinburgh Postnatal Depression Scale:  In the Past 7 Days   I have been able to laugh and see the funny side of things. 0    I have looked forward with enjoyment to things. 0    I have blamed myself unnecessarily when things went wrong. 2    I have been anxious or worried for no good reason. 0    I have felt scared or panicky for no good reason. 0    Things have been getting on top of me. 0    I have been so unhappy that I have had difficulty sleeping. 0    I have felt sad or miserable. 0    I have been so unhappy that I have been crying. 1    The thought of harming myself has occurred to me. 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 Baby is feeding by breast: milk supply adequate. Infant has a pediatrician/family doctor? Yes.  Childcare strategy if returning to work/school: n/a.  Pt has material needs met for her and baby: Yes.   Review of Systems:   Pertinent items are noted in HPI Denies Abnormal vaginal discharge w/ itching/odor/irritation, headaches, visual changes, shortness of breath, chest pain, abdominal pain, severe nausea/vomiting, or problems with urination or bowel movements. Pertinent History Reviewed:  Reviewed past medical,surgical, obstetrical and family history.  Reviewed problem list, medications and allergies. OB History  Gravida Para Term Preterm AB Living  _0 0 0 2  SAB TAB Ectopic Multiple Live Births  0 0 0 0 2    # Outcome Date GA Lbr Len/2nd Weight Sex Delivery Anes PTL Lv  2 Term 04/29/20 [redacted]w[redacted]d 7 lb 12.5 oz (3.53 kg) M CS-LTranv EPI  LIV  1 Term 11/09/18 [id number]:34 / 01:12 8 lb 6.6 oz (3.816 kg) F Vag-Spont EPI, Local N LIV     Birth Comments: WNL   Physical Assessment:   Vitals:   06/05/20 1352  BP: 105/72  Pulse: 73  Weight: 197 lb (89.4 kg)  Height: _1  (1.575 m)  Body mass index is 36.03 kg/m.       Physical Examination:   General  appearance: alert, well appearing, and in no distress  Mental status: alert, oriented to person, place, and time  Skin: warm & dry   Cardiovascular: normal heart rate noted   Respiratory: normal respiratory effort, no distress   Breasts: deferred, no complaints   Abdomen: soft, non-tender, c/s incision well-healed  Pelvic: examination not indicated  Rectal: not examined  Extremities: no edema  Chaperone: N/A         No results found for this or any previous visit (from the past 24 hour(s)).  Assessment & Plan:  1) Postpartum exam 2) 5 wks s/p primary cesarean section, low transverse incision 3) breast feeding 4) Depression screening 5) Contraception counseling, plans NFP  Essential components of care per ACOG recommendations:  1.  Mood and well being:   If positive depression screen, discussed and plan developed.   If using tobacco we discussed reduction/cessation and risk of relapse  If current substance abuse, we discussed and referral to local resources was offered.   2. Infant care and feeding:   If breastfeeding, discussed returning to work, pumping, breastfeeding-associated pain, guidance regarding return to fertility while lactating if not using another method. If needed, patient was provided with a letter to be allowed to pump q 2-3hrs to support lactation in a private location with access to a refrigerator to store breastmilk.    Recommended that all caregivers be immunized for flu, pertussis and other preventable communicable diseases  If pt does not have material needs met for her/baby, referred to local resources for help obtaining these.  3. Sexuality, contraception and birth spacing  Provided guidance regarding sexuality, management of dyspareunia, and resumption of intercourse  Discussed avoiding interpregnancy interval <56m7m and recommended birth spacing of 18 months  4. Sleep and fatigue  Discussed coping options for fatigue and sleep  disruption  Encouraged family/partner/community support of 4 hrs of uninterrupted sleep to help with mood and fatigue  5. Physical recovery   If pt had a C/S, assessed incisional pain and providing guidance on normal vs prolonged recovery  If pt had a laceration, perineal healing and pain reviewed.   If urinary or fecal incontinence, discussed management and referred to PT or uro/gyn if indicated   Patient is safe to resume physical activity. Discussed attainment of healthy weight.  6.  Chronic disease management  Discussed pregnancy complications if any, and their implications for future childbearing and long-term maternal health.  Review recommendations for prevention of recurrent pregnancy complications, such as 17 hydroxyprogesterone caproate to reduce risk for recurrent PTB not applicable, or aspirin to reduce risk of preeclampsia not applicable.  Pt had GDM: No. If yes, 2hr GTT scheduled: not applicable.  Reviewed medications and non-pregnant dosing including consideration of  whether pt is breastfeeding using a reliable resource such as LactMed: yes  Referred for f/u w/ PCP or subspecialist providers as indicated: not applicable  7. Health maintenance  Mammogram at 29yo or earlier if indicated  Pap smears as indicated  Meds: No orders of the defined types were placed in this encounter.   Follow-up: Return in about 6 months (around 12/03/2020) for Pap & physical.   No orders of the defined types were placed in this encounter.   [place], [facility] 06/05/2020 2:17 PM

## 2023-02-03 ENCOUNTER — Ambulatory Visit: Payer: BC Managed Care – PPO | Admitting: *Deleted

## 2023-02-03 VITALS — BP 131/80 | HR 84

## 2023-02-03 DIAGNOSIS — Z3201 Encounter for pregnancy test, result positive: Secondary | ICD-10-CM

## 2023-02-03 LAB — POCT URINE PREGNANCY: Preg Test, Ur: POSITIVE — AB

## 2023-02-03 NOTE — Progress Notes (Signed)
   NURSE VISIT- PREGNANCY CONFIRMATION   SUBJECTIVE:  Maria Brooks is a 32 y.o. G40P2002 female at [redacted]w[redacted]d by certain LMP of Patient's last menstrual period was 12/30/2022. Here for pregnancy confirmation.  Home pregnancy test: positive x many   She reports nausea.  She is taking prenatal vitamins.    OBJECTIVE:  LMP 12/30/2022   Breastfeeding No   Appears well, in no apparent distress  Results for orders placed or performed in visit on 02/03/23 (from the past 24 hour(s))  POCT urine pregnancy   Collection Time: 02/03/23  4:12 PM  Result Value Ref Range   Preg Test, Ur Positive (A) Negative    ASSESSMENT: Positive pregnancy test, [redacted]w[redacted]d by LMP    PLAN: Schedule for dating ultrasound in 3 weeks Prenatal vitamins: continue   Nausea medicines: not currently needed   OB packet given: Yes  Annamarie Dawley  02/03/2023 4:14 PM

## 2023-02-25 ENCOUNTER — Other Ambulatory Visit: Payer: Self-pay | Admitting: Obstetrics & Gynecology

## 2023-02-25 DIAGNOSIS — O3680X0 Pregnancy with inconclusive fetal viability, not applicable or unspecified: Secondary | ICD-10-CM

## 2023-02-26 ENCOUNTER — Other Ambulatory Visit: Payer: Self-pay | Admitting: Adult Health

## 2023-02-26 ENCOUNTER — Encounter: Payer: Self-pay | Admitting: Women's Health

## 2023-02-26 ENCOUNTER — Ambulatory Visit (INDEPENDENT_AMBULATORY_CARE_PROVIDER_SITE_OTHER): Payer: BC Managed Care – PPO

## 2023-02-26 DIAGNOSIS — Z3481 Encounter for supervision of other normal pregnancy, first trimester: Secondary | ICD-10-CM

## 2023-02-26 DIAGNOSIS — Z3A08 8 weeks gestation of pregnancy: Secondary | ICD-10-CM

## 2023-02-26 DIAGNOSIS — O3680X0 Pregnancy with inconclusive fetal viability, not applicable or unspecified: Secondary | ICD-10-CM

## 2023-02-26 MED ORDER — ONDANSETRON HCL 4 MG PO TABS: 4.0000 mg | ORAL_TABLET | Freq: Three times a day (TID) | ORAL | 1 refills | Status: DC | PRN

## 2023-02-26 NOTE — Progress Notes (Signed)
Will rx zofran °

## 2023-02-26 NOTE — Progress Notes (Signed)
[place] 8+2 wks,single IUP with yolk sac,CRL 16.98 mm,FHR 180 bpm,simple right corpus luteal cyst 2.8 x 2.6 x 2.4 cm,normal left ovary

## 2023-03-25 ENCOUNTER — Other Ambulatory Visit: Payer: Self-pay | Admitting: Women's Health

## 2023-03-25 MED ORDER — ONDANSETRON HCL 4 MG PO TABS: 4.0000 mg | ORAL_TABLET | Freq: Three times a day (TID) | ORAL | 1 refills | Status: DC | PRN

## 2023-03-31 DIAGNOSIS — Z3490 Encounter for supervision of normal pregnancy, unspecified, unspecified trimester: Secondary | ICD-10-CM | POA: Insufficient documentation

## 2023-03-31 DIAGNOSIS — O0990 Supervision of high risk pregnancy, unspecified, unspecified trimester: Secondary | ICD-10-CM | POA: Insufficient documentation

## 2023-04-01 ENCOUNTER — Other Ambulatory Visit: Payer: Self-pay | Admitting: Obstetrics & Gynecology

## 2023-04-01 DIAGNOSIS — Z3682 Encounter for antenatal screening for nuchal translucency: Secondary | ICD-10-CM

## 2023-04-02 ENCOUNTER — Ambulatory Visit (INDEPENDENT_AMBULATORY_CARE_PROVIDER_SITE_OTHER): Payer: BC Managed Care – PPO | Admitting: Women's Health

## 2023-04-02 ENCOUNTER — Other Ambulatory Visit (HOSPITAL_COMMUNITY)
Admission: RE | Admit: 2023-04-02 | Discharge: 2023-04-02 | Disposition: A | Discharge status: Home / Self Care | Payer: BC Managed Care – PPO | Source: Ambulatory Visit | Attending: Women's Health | Admitting: Women's Health

## 2023-04-02 ENCOUNTER — Ambulatory Visit (INDEPENDENT_AMBULATORY_CARE_PROVIDER_SITE_OTHER): Payer: BC Managed Care – PPO

## 2023-04-02 ENCOUNTER — Encounter: Payer: Self-pay | Admitting: Advanced Practice Midwife

## 2023-04-02 ENCOUNTER — Encounter: Payer: BC Managed Care – PPO | Admitting: *Deleted

## 2023-04-02 VITALS — BP 107/74 | HR 72 | Wt 231.8 lb

## 2023-04-02 DIAGNOSIS — Z3481 Encounter for supervision of other normal pregnancy, first trimester: Secondary | ICD-10-CM

## 2023-04-02 DIAGNOSIS — Z3480 Encounter for supervision of other normal pregnancy, unspecified trimester: Secondary | ICD-10-CM

## 2023-04-02 DIAGNOSIS — Z3682 Encounter for antenatal screening for nuchal translucency: Secondary | ICD-10-CM

## 2023-04-02 DIAGNOSIS — Z86718 Personal history of other venous thrombosis and embolism: Secondary | ICD-10-CM

## 2023-04-02 DIAGNOSIS — Z98891 History of uterine scar from previous surgery: Secondary | ICD-10-CM | POA: Diagnosis not present

## 2023-04-02 DIAGNOSIS — G08 Intracranial and intraspinal phlebitis and thrombophlebitis: Secondary | ICD-10-CM

## 2023-04-02 DIAGNOSIS — Z113 Encounter for screening for infections with a predominantly sexual mode of transmission: Secondary | ICD-10-CM | POA: Insufficient documentation

## 2023-04-02 DIAGNOSIS — Z3A13 13 weeks gestation of pregnancy: Secondary | ICD-10-CM | POA: Insufficient documentation

## 2023-04-02 DIAGNOSIS — Z124 Encounter for screening for malignant neoplasm of cervix: Secondary | ICD-10-CM | POA: Insufficient documentation

## 2023-04-02 DIAGNOSIS — Z363 Encounter for antenatal screening for malformations: Secondary | ICD-10-CM | POA: Diagnosis not present

## 2023-04-02 MED ORDER — ENOXAPARIN SODIUM 80 MG/0.8ML IJ SOSY
80.0000 mg | PREFILLED_SYRINGE | INTRAMUSCULAR | 2 refills | Status: DC
Start: 2023-04-02 — End: 2023-10-14

## 2023-04-02 NOTE — Progress Notes (Addendum)
INITIAL OBSTETRICAL VISIT Patient name: Maria Brooks MRN [id number]  Date of birth: 04/12/1991 Chief Complaint:   Initial Prenatal Visit (nausea)  History of Present Illness:   Maria Brooks is a 32 y.o. G66P2002 Caucasian female at [redacted]w[redacted]d by LMP c/w u/s at 8.1 weeks with an Estimated Date of Delivery: 10/06/23 being seen today for her initial obstetrical visit.   Patient's last menstrual period was 12/30/2022. Her obstetrical history is significant for  term SVD 2020; pLTCS for abruption 2021 .   Today she reports nausea.  Last pap Oct 2019. Results were: NILM w/ HRHPV not done     04/02/2023   11:13 AM 02/03/2020   10:03 AM 11/02/2019   11:14 AM 04/30/2018    3:03 PM  Depression screen PHQ 2/9  Decreased Interest 0 1 0 1  Down, Depressed, Hopeless 0 0 0 0  PHQ - 2 Score 0 1 0 1  Altered sleeping 0 1 0 1  Tired, decreased energy 1 0 1 1  Change in appetite 1 0 0 0  Feeling bad or failure about yourself  0 0 0 0  Trouble concentrating 0 0 0 0  Moving slowly or fidgety/restless 0 0 0 0  Suicidal thoughts 0 0 0 0  PHQ-9 Score 2 2 1 3   Difficult doing work/chores  Not difficult at all Not difficult at all         04/02/2023   11:14 AM 02/03/2020   10:03 AM 11/02/2019   11:15 AM  GAD 7 : Generalized Anxiety Score  Nervous, Anxious, on Edge 0 0 0  Control/stop worrying 0 1 0  Worry too much - different things 0 0 1  Trouble relaxing 0 0 0  Restless 0 0 0  Easily annoyed or irritable 0 0 0  Afraid - awful might happen 0 0 0  Total GAD 7 Score 0 1 1  Anxiety Difficulty  Not difficult at all Not difficult at all     Review of Systems:   Pertinent items are noted in HPI Denies cramping/contractions, leakage of fluid, vaginal bleeding, abnormal vaginal discharge w/ itching/odor/irritation, headaches, visual changes, shortness of breath, chest pain, abdominal pain, severe nausea/vomiting, or problems with urination or bowel movements unless otherwise stated above.  Pertinent  History Reviewed:  Reviewed past medical,surgical, social, obstetrical and family history.  Reviewed problem list, medications and allergies. OB History  Gravida Para Term Preterm AB Living  3 2 2  0 0 2  SAB IAB Ectopic Multiple Live Births  0 0 0 0 2    # Outcome Date GA Lbr Len/2nd Weight Sex Type Anes PTL Lv  3 Current           2 Term 04/29/20 [redacted]w[redacted]d  7 lb 12.5 oz (3.53 kg) M CS-LTranv EPI N LIV     Complications: Fetal Intolerance, Placental abruption  1 Term 11/09/18 [redacted]w[redacted]d 28:34 / 01:12 8 lb 6.6 oz (3.816 kg) F Vag-Spont EPI, Local N LIV     Birth Comments: WNL   Physical Assessment:   Vitals:   04/02/23 1144  BP: 107/74  Pulse: 72  Weight: 231 lb 12.8 oz (105.1 kg)  Body mass index is 42.4 kg/m.       Physical Examination:  General appearance - well appearing, and in no distress  Mental status - alert, oriented to person, place, and time  Psych:  She has a normal mood and affect  Skin - warm and dry, normal color,  no suspicious lesions noted  Chest - effort normal, all lung fields clear to auscultation bilaterally  Heart - normal rate and regular rhythm  Abdomen - soft, nontender  Extremities:  No swelling or varicosities noted  Pelvic - VULVA: normal appearing vulva with no masses, tenderness or lesions  VAGINA: normal appearing vagina with normal color and discharge, no lesions  CERVIX: normal appearing cervix without discharge or lesions, no CMT  Thin prep pap is done with HR HPV cotesting  Chaperone:  Sherri Kaywood     TODAY'S NT [place] 13+2 wks,measurements c/w dates,FHR 169 bpm,posterior placenta gr 0,normal left ovary,simple right corpus luteal cyst 2.5 x 2 x 2.4 cm,NB present,NT 1.6 mm,CRL 75.61 mm   No results found for this or any previous visit (from the past 24 hour(s)).  Assessment & Plan:  1) Low-Risk Pregnancy G3P2002 at [redacted]w[redacted]d with an Estimated Date of Delivery: 10/06/23   2) Initial OB visit  3) Hx cerebral venous sinus thrombosis in 2017 on COCs, has  taken daily Lovenox 80mg  during both past pregnancies; per LHE will start daily dosing  4) Hx pLTCS for abruption, desires VBAC> great candidate as had vag delivery prior; consent form copy given to look over and keep  Meds:  Meds ordered this encounter  Medications   enoxaparin (LOVENOX) 80 MG/0.8ML injection    Sig: Inject 0.8 mLs (80 mg total) into the skin daily.    Dispense:  72 mL    Refill:  2    Order Specific Question:   Supervising Provider    Answer:   Myna Hidalgo [[id number]]    Initial labs obtained Continue prenatal vitamins Reviewed n/v relief measures and warning s/s to report Reviewed recommended weight gain based on pre-gravid BMI Encouraged well-balanced diet Genetic & carrier screening discussed: requests Panorama and NT/IT, declines Horizon  (neg 2021) Ultrasound discussed; fetal survey: requested CCNC completed> form faxed if has or is planning to apply for medicaid The nature of  - [facility] [employer] with multiple MDs and other Advanced Practice Providers was explained to patient; also emphasized that fellows, residents, and students are part of our team. Does have home bp cuff. Office bp cuff given: no. Rx sent: no. Check bp weekly, let us know if consistently >140/90.   No indications for ASA therapy or early HgbA1c (per uptodate)  Follow-up: Return for 4wk LROB/2nd IT; 7wk LROB/anatomy u/s.   Orders Placed This Encounter  Procedures   Urine Culture   US OB Comp + 14 Wk   Integrated 1   CBC/D/Plt+RPR+Rh+ABO+RubIgG...   [facility] PRENATAL TEST    Arabella Merles CNM 04/02/2023 5:05 PM

## 2023-04-02 NOTE — Progress Notes (Signed)
[place] 13+2 wks,measurements c/w dates,FHR 169 bpm,posterior placenta gr 0,normal left ovary,simple right corpus luteal cyst 2.5 x 2 x 2.4 cm,NB present,NT 1.6 mm,CRL 75.61 mm

## 2023-04-02 NOTE — Patient Instructions (Signed)
Maria Brooks, thank you for choosing our office today! We appreciate the opportunity to meet your healthcare needs. You may receive a short survey by mail, e-mail, or through [employer]. If you are happy with your care we would appreciate if you could take just a few minutes to complete the survey questions. We read all of your comments and take your feedback very seriously. Thank you again for choosing our office.  Center for [employer] [facility] Team at [facility]  [facility] at [facility] ([address] [place], [place] [address]) Entrance C, located off of [address] Free 24/7 valet parking   Nausea & Vomiting Have saltine crackers or pretzels by your bed and eat a few bites before you raise your head out of bed in the morning Eat small frequent meals throughout the day instead of large meals Drink plenty of fluids throughout the day to stay hydrated, just don't drink a lot of fluids with your meals.  This can make your stomach fill up faster making you feel sick Do not brush your teeth right after you eat Products with real ginger are good for nausea, like ginger ale and ginger hard candy Make sure it says made with real ginger! Sucking on sour candy like lemon heads is also good for nausea If your prenatal vitamins make you nauseated, take them at night so you will sleep through the nausea Sea Bands If you feel like you need medicine for the nausea & vomiting please let us know If you are unable to keep any fluids or food down please let us know   Constipation Drink plenty of fluid, preferably water, throughout the day Eat foods high in fiber such as fruits, vegetables, and grains Exercise, such as walking, is a good way to keep your bowels regular Drink warm fluids, especially warm prune juice, or decaf coffee Eat a 1/2 cup of real oatmeal (not instant), 1/2 cup applesauce, and 1/2-1 cup warm prune juice every day If needed, you may take Colace (docusate sodium) stool softener  once or twice a day to help keep the stool soft.  If you still are having problems with constipation, you may take Miralax once daily as needed to help keep your bowels regular.   Home Blood Pressure Monitoring for Patients   Your provider has recommended that you check your blood pressure (BP) at least once a week at home. If you do not have a blood pressure cuff at home, one will be provided for you. Contact your provider if you have not received your monitor within 1 week.   Helpful Tips for Accurate Home Blood Pressure Checks  Don't smoke, exercise, or drink caffeine 30 minutes before checking your BP Use the restroom before checking your BP (a full bladder can raise your pressure) Relax in a comfortable upright chair Feet on the ground Left arm resting comfortably on a flat surface at the level of your heart Legs uncrossed Back supported Sit quietly and don't talk Place the cuff on your bare arm Adjust snuggly, so that only two fingertips can fit between your skin and the top of the cuff Check 2 readings separated by at least one minute Keep a log of your BP readings For a visual, please reference this diagram: http://ccnc.care/bpdiagram  Provider Name: [facility]     Phone: [phone]  Zone 1: ALL CLEAR  Continue to monitor your symptoms:  BP reading is less than 140 (top number) or less than 90 (bottom  number)  No right upper stomach pain No headaches or seeing spots No feeling nauseated or throwing up No swelling in face and hands  Zone 2: CAUTION Call your doctor's office for any of the following:  BP reading is greater than 140 (top number) or greater than 90 (bottom number)  Stomach pain under your ribs in the middle or right side Headaches or seeing spots Feeling nauseated or throwing up Swelling in face and hands  Zone 3: EMERGENCY  Seek immediate medical care if you have any of the following:  BP reading is greater than160 (top number) or greater than  110 (bottom number) Severe headaches not improving with Tylenol Serious difficulty catching your breath Any worsening symptoms from Zone 2    First Trimester of Pregnancy The first trimester of pregnancy is from week 1 until the end of week 12 (months 1 through 3). A week after a sperm fertilizes an egg, the egg will implant on the wall of the uterus. This embryo will begin to develop into a baby. Genes from you and your partner are forming the baby. The female genes determine whether the baby is a boy or a girl. At 6-8 weeks, the eyes and face are formed, and the heartbeat can be seen on ultrasound. At the end of 12 weeks, all the baby's organs are formed.  Now that you are pregnant, you will want to do everything you can to have a healthy baby. Two of the most important things are to get good prenatal care and to follow your health care provider's instructions. Prenatal care is all the medical care you receive before the baby's birth. This care will help prevent, find, and treat any problems during the pregnancy and childbirth. BODY CHANGES Your body goes through many changes during pregnancy. The changes vary from woman to woman.  You may gain or lose a couple of pounds at first. You may feel sick to your stomach (nauseous) and throw up (vomit). If the vomiting is uncontrollable, call your health care provider. You may tire easily. You may develop headaches that can be relieved by medicines approved by your health care provider. You may urinate more often. Painful urination may mean you have a bladder infection. You may develop heartburn as a result of your pregnancy. You may develop constipation because certain hormones are causing the muscles that push waste through your intestines to slow down. You may develop hemorrhoids or swollen, bulging veins (varicose veins). Your breasts may begin to grow larger and become tender. Your nipples may stick out more, and the tissue that surrounds them  (areola) may become darker. Your gums may bleed and may be sensitive to brushing and flossing. Dark spots or blotches (chloasma, mask of pregnancy) may develop on your face. This will likely fade after the baby is born. Your menstrual periods will stop. You may have a loss of appetite. You may develop cravings for certain kinds of food. You may have changes in your emotions from day to day, such as being excited to be pregnant or being concerned that something may go wrong with the pregnancy and baby. You may have more vivid and strange dreams. You may have changes in your hair. These can include thickening of your hair, rapid growth, and changes in texture. Some women also have hair loss during or after pregnancy, or hair that feels dry or thin. Your hair will most likely return to normal after your baby is born. WHAT TO EXPECT AT YOUR PRENATAL  VISITS During a routine prenatal visit: You will be weighed to make sure you and the baby are growing normally. Your blood pressure will be taken. Your abdomen will be measured to track your baby's growth. The fetal heartbeat will be listened to starting around week 10 or 12 of your pregnancy. Test results from any previous visits will be discussed. Your health care provider may ask you: How you are feeling. If you are feeling the baby move. If you have had any abnormal symptoms, such as leaking fluid, bleeding, severe headaches, or abdominal cramping. If you have any questions. Other tests that may be performed during your first trimester include: Blood tests to find your blood type and to check for the presence of any previous infections. They will also be used to check for low iron levels (anemia) and Rh antibodies. Later in the pregnancy, blood tests for diabetes will be done along with other tests if problems develop. Urine tests to check for infections, diabetes, or protein in the urine. An ultrasound to confirm the proper growth and development  of the baby. An amniocentesis to check for possible genetic problems. Fetal screens for spina bifida and Down syndrome. You may need other tests to make sure you and the baby are doing well. HOME CARE INSTRUCTIONS  Medicines Follow your health care provider's instructions regarding medicine use. Specific medicines may be either safe or unsafe to take during pregnancy. Take your prenatal vitamins as directed. If you develop constipation, try taking a stool softener if your health care provider approves. Diet Eat regular, well-balanced meals. Choose a variety of foods, such as meat or vegetable-based protein, fish, milk and low-fat dairy products, vegetables, fruits, and whole grain breads and cereals. Your health care provider will help you determine the amount of weight gain that is right for you. Avoid raw meat and uncooked cheese. These carry germs that can cause birth defects in the baby. Eating four or five small meals rather than three large meals a day may help relieve nausea and vomiting. If you start to feel nauseous, eating a few soda crackers can be helpful. Drinking liquids between meals instead of during meals also seems to help nausea and vomiting. If you develop constipation, eat more high-fiber foods, such as fresh vegetables or fruit and whole grains. Drink enough fluids to keep your urine clear or pale yellow. Activity and Exercise Exercise only as directed by your health care provider. Exercising will help you: Control your weight. Stay in shape. Be prepared for labor and delivery. Experiencing pain or cramping in the lower abdomen or low back is a good sign that you should stop exercising. Check with your health care provider before continuing normal exercises. Try to avoid standing for long periods of time. Move your legs often if you must stand in one place for a long time. Avoid heavy lifting. Wear low-heeled shoes, and practice good posture. You may continue to have sex  unless your health care provider directs you otherwise. Relief of Pain or Discomfort Wear a good support bra for breast tenderness.   Take warm sitz baths to soothe any pain or discomfort caused by hemorrhoids. Use hemorrhoid cream if your health care provider approves.   Rest with your legs elevated if you have leg cramps or low back pain. If you develop varicose veins in your legs, wear support hose. Elevate your feet for 15 minutes, 3-4 times a day. Limit salt in your diet. Prenatal Care Schedule your prenatal visits by the  twelfth week of pregnancy. They are usually scheduled monthly at first, then more often in the last 2 months before delivery. Write down your questions. Take them to your prenatal visits. Keep all your prenatal visits as directed by your health care provider. Safety Wear your seat belt at all times when driving. Make a list of emergency phone numbers, including numbers for family, friends, the hospital, and police and fire departments. General Tips Ask your health care provider for a referral to a local prenatal education class. Begin classes no later than at the beginning of month 6 of your pregnancy. Ask for help if you have counseling or nutritional needs during pregnancy. Your health care provider can offer advice or refer you to specialists for help with various needs. Do not use hot tubs, steam rooms, or saunas. Do not douche or use tampons or scented sanitary pads. Do not cross your legs for long periods of time. Avoid cat litter boxes and soil used by cats. These carry germs that can cause birth defects in the baby and possibly loss of the fetus by miscarriage or stillbirth. Avoid all smoking, herbs, alcohol, and medicines not prescribed by your health care provider. Chemicals in these affect the formation and growth of the baby. Schedule a dentist appointment. At home, brush your teeth with a soft toothbrush and be gentle when you floss. SEEK MEDICAL CARE IF:   You have dizziness. You have mild pelvic cramps, pelvic pressure, or nagging pain in the abdominal area. You have persistent nausea, vomiting, or diarrhea. You have a bad smelling vaginal discharge. You have pain with urination. You notice increased swelling in your face, hands, legs, or ankles. SEEK IMMEDIATE MEDICAL CARE IF:  You have a fever. You are leaking fluid from your vagina. You have spotting or bleeding from your vagina. You have severe abdominal cramping or pain. You have rapid weight gain or loss. You vomit blood or material that looks like coffee grounds. You are exposed to [place] measles and have never had them. You are exposed to fifth disease or chickenpox. You develop a severe headache. You have shortness of breath. You have any kind of trauma, such as from a fall or a car accident. Document Released: 06/25/2001 Document Revised: 11/15/2013 Document Reviewed: 05/11/2013 [facility] Patient Information 2015 [place], [place]. This information is not intended to replace advice given to you by your health care provider. Make sure you discuss any questions you have with your health care provider.

## 2023-04-02 NOTE — Addendum Note (Signed)
Addended by: Cam Hai D on: 04/02/2023 05:07 PM   Modules accepted: Orders

## 2023-04-03 LAB — INTEGRATED 1

## 2023-04-04 LAB — URINE CULTURE

## 2023-04-07 LAB — CYTOLOGY - PAP
Chlamydia: NEGATIVE
Comment: NEGATIVE
Comment: NEGATIVE
Comment: NORMAL
Diagnosis: NEGATIVE
High risk HPV: NEGATIVE
Neisseria Gonorrhea: NEGATIVE

## 2023-04-09 LAB — PANORAMA PRENATAL TEST FULL PANEL:PANORAMA TEST PLUS 5 ADDITIONAL MICRODELETIONS: FETAL FRACTION: 5.4

## 2023-04-14 LAB — OB RESULTS CONSOLE GBS: GBS: POSITIVE

## 2023-04-30 ENCOUNTER — Ambulatory Visit (INDEPENDENT_AMBULATORY_CARE_PROVIDER_SITE_OTHER): Payer: BC Managed Care – PPO | Admitting: Obstetrics and Gynecology

## 2023-04-30 VITALS — BP 109/73 | HR 70 | Wt 229.0 lb

## 2023-04-30 DIAGNOSIS — Z3A17 17 weeks gestation of pregnancy: Secondary | ICD-10-CM

## 2023-04-30 DIAGNOSIS — Z1379 Encounter for other screening for genetic and chromosomal anomalies: Secondary | ICD-10-CM

## 2023-04-30 DIAGNOSIS — Z3480 Encounter for supervision of other normal pregnancy, unspecified trimester: Secondary | ICD-10-CM

## 2023-04-30 DIAGNOSIS — Z98891 History of uterine scar from previous surgery: Secondary | ICD-10-CM

## 2023-04-30 DIAGNOSIS — G08 Intracranial and intraspinal phlebitis and thrombophlebitis: Secondary | ICD-10-CM

## 2023-04-30 MED ORDER — ONDANSETRON HCL 4 MG PO TABS
4.0000 mg | ORAL_TABLET | Freq: Three times a day (TID) | ORAL | 1 refills | Status: DC | PRN
Start: 2023-04-30 — End: 2023-06-09

## 2023-04-30 NOTE — Progress Notes (Signed)
   PRENATAL VISIT NOTE  Subjective:  Maria Brooks is a 32 y.o. G3P2002 at [redacted]w[redacted]d being seen today for ongoing prenatal care.  She is currently monitored for the following issues for this low-risk pregnancy and has Cerebral venous sinus thrombosis; Encounter for supervision of normal pregnancy, antepartum; and History of cesarean delivery on their problem list.  Patient reports  nausea in AM but does well throughout day if take nausea meds in morning .  Contractions: Not present. Vag. Bleeding: None.  Movement: Present. Denies leaking of fluid.   The following portions of the patient's history were reviewed and updated as appropriate: allergies, current medications, past family history, past medical history, past social history, past surgical history and problem list.   Objective:   Vitals:   04/30/23 0853  BP: 109/73  Pulse: 70  Weight: 229 lb (103.9 kg)    Fetal Status: Fetal Heart Rate (bpm): 160   Movement: Present     General:  Alert, oriented and cooperative. Patient is in no acute distress.  Skin: Skin is warm and dry. No rash noted.   Cardiovascular: Normal heart rate noted  Respiratory: Normal respiratory effort, no problems with respiration noted  Abdomen: Soft, gravid, appropriate for gestational age.  Pain/Pressure: Absent     Pelvic: Cervical exam deferred        Extremities: Normal range of motion.     Mental Status: Normal mood and affect. Normal behavior. Normal judgment and thought content.   Assessment and Plan:  Pregnancy: G3P2002 at [redacted]w[redacted]d 1. History of cesarean delivery Desires VBAC  2. Supervision of other normal pregnancy, antepartum BP and FHR normal   3. [redacted] weeks gestation of pregnancy 4. Genetic screening Labwork today  - [facility] 2  5. Cerebral venous sinus thrombosis On lovenox daily    Preterm labor symptoms and general obstetric precautions including but not limited to vaginal bleeding, contractions, leaking of fluid and fetal movement  were reviewed in detail with the patient. Please refer to After Visit Summary for other counseling recommendations.   Return in about 4 weeks (around 05/28/2023) for OB VISIT (MD or APP).  Future Appointments  Date Time Provider Department Center  05/21/2023  3:00 PM [facility] [place] [facility] None  05/21/2023  3:50 PM Arabella Merles, CNM [facility]    Albertine Grates, FNP

## 2023-05-21 ENCOUNTER — Encounter: Payer: Self-pay | Admitting: Advanced Practice Midwife

## 2023-05-21 ENCOUNTER — Ambulatory Visit: Payer: BC Managed Care – PPO

## 2023-05-21 ENCOUNTER — Ambulatory Visit (INDEPENDENT_AMBULATORY_CARE_PROVIDER_SITE_OTHER): Payer: BC Managed Care – PPO | Admitting: Advanced Practice Midwife

## 2023-05-21 VITALS — BP 109/76 | HR 87 | Wt 228.0 lb

## 2023-05-21 DIAGNOSIS — Z3480 Encounter for supervision of other normal pregnancy, unspecified trimester: Secondary | ICD-10-CM

## 2023-05-21 DIAGNOSIS — Z363 Encounter for antenatal screening for malformations: Secondary | ICD-10-CM | POA: Diagnosis not present

## 2023-05-21 DIAGNOSIS — Z3A20 20 weeks gestation of pregnancy: Secondary | ICD-10-CM

## 2023-05-21 DIAGNOSIS — Z3482 Encounter for supervision of other normal pregnancy, second trimester: Secondary | ICD-10-CM

## 2023-05-21 NOTE — Progress Notes (Signed)
   LOW-RISK PREGNANCY VISIT Patient name: Maria Brooks MRN [id number]  Date of birth: 1991-03-04 Chief Complaint:   Routine Prenatal Visit  History of Present Illness:   Maria Brooks is a 32 y.o. G38P2002 female at [redacted]w[redacted]d with an Estimated Date of Delivery: 10/06/23 being seen today for ongoing management of a low-risk pregnancy.  Today she reports no complaints. Contractions: Not present. Vag. Bleeding: None.  Movement: Present. denies leaking of fluid. Review of Systems:   Pertinent items are noted in HPI Denies abnormal vaginal discharge w/ itching/odor/irritation, headaches, visual changes, shortness of breath, chest pain, abdominal pain, severe nausea/vomiting, or problems with urination or bowel movements unless otherwise stated above. Pertinent History Reviewed:  Reviewed past medical,surgical, social, obstetrical and family history.  Reviewed problem list, medications and allergies. Physical Assessment:   Vitals:   05/21/23 1505  BP: 109/76  Pulse: 87  Weight: 228 lb (103.4 kg)  Body mass index is 41.7 kg/m.        Physical Examination:   General appearance: Well appearing, and in no distress  Mental status: Alert, oriented to person, place, and time  Skin: Warm & dry  Cardiovascular: Normal heart rate noted  Respiratory: Normal respiratory effort, no distress  Abdomen: Soft, gravid, nontender  Pelvic: Cervical exam deferred         Extremities: Edema: None  Fetal Status: Fetal Heart Rate (bpm): 169 u/s   Movement: Present    Anatomy u/s: [place] 20+2 wks,breech,cx 4.5 cm,posterior placenta gr 0,normal ovaries,SVP of fluid 4.3 cm,FHR 169 bpm,EFW 348 g 48%,anatomy complete,no obvious abnormalities   No results found for this or any previous visit (from the past 24 hour(s)).  Assessment & Plan:  1) Low-risk pregnancy G3P2002 at [redacted]w[redacted]d with an Estimated Date of Delivery: 10/06/23   2) Prev C/S for abruption w G2, desires TOLAC  3) Hx provoked thrombosis, taking daily Lovenox  80mg   4) 2nd IT result not calculated, will get the missing info and have lab recalculate   Meds: No orders of the defined types were placed in this encounter.  Labs/procedures today: anatomy u/s  Plan:  Continue routine obstetrical care   Reviewed: Preterm labor symptoms and general obstetric precautions including but not limited to vaginal bleeding, contractions, leaking of fluid and fetal movement were reviewed in detail with the patient.  All questions were answered. Has home bp cuff. Check bp weekly, let us know if >140/90.   Follow-up: Return in about 4 weeks (around 06/18/2023) for LROB, in person.  No orders of the defined types were placed in this encounter.  Arabella Merles CNM 05/21/2023 7:26 PM

## 2023-05-21 NOTE — Progress Notes (Signed)
[place] 20+2 wks,breech,cx 4.5 cm,posterior placenta gr 0,normal ovaries,SVP of fluid 4.3 cm,FHR 169 bpm,EFW 348 g 48%,anatomy complete,no obvious abnormalities

## 2023-05-21 NOTE — Patient Instructions (Signed)
Maria Brooks, thank you for choosing our office today! We appreciate the opportunity to meet your healthcare needs. You may receive a short survey by mail, e-mail, or through [employer]. If you are happy with your care we would appreciate if you could take just a few minutes to complete the survey questions. We read all of your comments and take your feedback very seriously. Thank you again for choosing our office.  Center for [employer] Team at [facility] at [facility] ([address] [place], [place] [address]) Entrance C, located off of [address] Free 24/7 valet parking  Go to [email or web address] to register for FREE online childbirth classes  Call the office [phone]) or go to [facility] if: You begin to severe cramping Your water breaks.  Sometimes it is a big gush of fluid, sometimes it is just a trickle that keeps getting your panties wet or running down your legs You have vaginal bleeding.  It is normal to have a small amount of spotting if your cervix was checked.   [facility] Pediatricians/Family Doctors [facility]): [address] Colette Ribas, [phone]           [facility]: [address], [phone]                [facility] Medicine [facility]): [address], [phone] (call to ask if accepting patients) [facility] Department: [address], [place], [phone]    [facility] Pediatricians/Family Doctors [facility]): [phone] [address], [phone] [facility]: [address] [place], [phone] [facility]: [address], [phone]  [facility] Doctors  [facility]): [phone] [facility]: [address], [phone]   [facility] Doctors [facility]: [address], [phone]  [facility] Doctors  [facility]: [phone], [phone]  Home Blood Pressure Monitoring for Patients   Your provider has recommended that you check your blood pressure (BP) at least once a week at home. If you do not have a blood pressure cuff at home, one will be provided for you. Contact your provider if you have not received your monitor within 1 week.   Helpful Tips for Accurate Home Blood Pressure Checks  Don't smoke, exercise, or drink caffeine 30 minutes before checking your BP Use the restroom before checking your BP (a full bladder can raise your pressure) Relax in a comfortable upright chair Feet on the ground Left arm resting comfortably on a flat surface at the level of your heart Legs uncrossed Back supported Sit quietly and don't talk Place the cuff on your bare arm Adjust snuggly, so that only two fingertips can fit between your skin and the top of the cuff Check 2 readings separated by at least one minute Keep a log of your BP readings For a visual, please reference this diagram: http://ccnc.care/bpdiagram  Provider Name: [facility]     Phone: [phone]  Zone 1: ALL CLEAR  Continue to monitor your symptoms:  BP reading is less than 140 (top number) or less than 90 (bottom number)  No right upper stomach pain No headaches or seeing spots No feeling nauseated or throwing up No swelling in face and hands  Zone 2: CAUTION Call your doctor's office for any of the following:  BP reading is greater than 140 (top number) or greater than  90 (bottom number)  Stomach pain under your ribs in the middle or right side Headaches or seeing spots Feeling nauseated or throwing up Swelling in face and hands  Zone 3: EMERGENCY  Seek immediate medical care if you have any of the following:  BP reading is greater than160 (top number) or greater than 110 (bottom number) Severe headaches not improving with Tylenol Serious difficulty catching your breath Any worsening symptoms from  Zone 2     Second Trimester of Pregnancy The second trimester is from week 14 through week 27 (months 4 through 6). The second trimester is often a time when you feel your best. Your body has adjusted to being pregnant, and you begin to feel better physically. Usually, morning sickness has lessened or quit completely, you may have more energy, and you may have an increase in appetite. The second trimester is also a time when the fetus is growing rapidly. At the end of the sixth month, the fetus is about 9 inches long and weighs about 1 pounds. You will likely begin to feel the baby move (quickening) between 16 and 20 weeks of pregnancy. Body changes during your second trimester Your body continues to go through many changes during your second trimester. The changes vary from woman to woman. Your weight will continue to increase. You will notice your lower abdomen bulging out. You may begin to get stretch marks on your hips, abdomen, and breasts. You may develop headaches that can be relieved by medicines. The medicines should be approved by your health care provider. You may urinate more often because the fetus is pressing on your bladder. You may develop or continue to have heartburn as a result of your pregnancy. You may develop constipation because certain hormones are causing the muscles that push waste through your intestines to slow down. You may develop hemorrhoids or swollen, bulging veins (varicose veins). You may have back pain. This is caused by: Weight gain. Pregnancy hormones that are relaxing the joints in your pelvis. A shift in weight and the muscles that support your balance. Your breasts will continue to grow and they will continue to become tender. Your gums may bleed and may be sensitive to brushing and flossing. Dark spots or blotches (chloasma, mask of pregnancy) may develop on your face. This will likely fade after the baby is born. A dark line from your belly button to  the pubic area (linea nigra) may appear. This will likely fade after the baby is born. You may have changes in your hair. These can include thickening of your hair, rapid growth, and changes in texture. Some women also have hair loss during or after pregnancy, or hair that feels dry or thin. Your hair will most likely return to normal after your baby is born.  What to expect at prenatal visits During a routine prenatal visit: You will be weighed to make sure you and the fetus are growing normally. Your blood pressure will be taken. Your abdomen will be measured to track your baby's growth. The fetal heartbeat will be listened to. Any test results from the previous visit will be discussed.  Your health care provider may ask you: How you are feeling. If you are feeling the baby move. If you have had any abnormal symptoms, such as leaking fluid, bleeding, severe headaches, or abdominal cramping. If you are using any tobacco products, including cigarettes, chewing tobacco, and electronic cigarettes. If you have any questions.  Other tests that may be performed during  your second trimester include: Blood tests that check for: Low iron levels (anemia). High blood sugar that affects pregnant women (gestational diabetes) between 107 and 28 weeks. Rh antibodies. This is to check for a protein on red blood cells (Rh factor). Urine tests to check for infections, diabetes, or protein in the urine. An ultrasound to confirm the proper growth and development of the baby. An amniocentesis to check for possible genetic problems. Fetal screens for spina bifida and Down syndrome. HIV (human immunodeficiency virus) testing. Routine prenatal testing includes screening for HIV, unless you choose not to have this test.  Follow these instructions at home: Medicines Follow your health care provider's instructions regarding medicine use. Specific medicines may be either safe or unsafe to take during  pregnancy. Take a prenatal vitamin that contains at least 600 micrograms (mcg) of folic acid. If you develop constipation, try taking a stool softener if your health care provider approves. Eating and drinking Eat a balanced diet that includes fresh fruits and vegetables, whole grains, good sources of protein such as meat, eggs, or tofu, and low-fat dairy. Your health care provider will help you determine the amount of weight gain that is right for you. Avoid raw meat and uncooked cheese. These carry germs that can cause birth defects in the baby. If you have low calcium intake from food, talk to your health care provider about whether you should take a daily calcium supplement. Limit foods that are high in fat and processed sugars, such as fried and sweet foods. To prevent constipation: Drink enough fluid to keep your urine clear or pale yellow. Eat foods that are high in fiber, such as fresh fruits and vegetables, whole grains, and beans. Activity Exercise only as directed by your health care provider. Most women can continue their usual exercise routine during pregnancy. Try to exercise for 30 minutes at least 5 days a week. Stop exercising if you experience uterine contractions. Avoid heavy lifting, wear low heel shoes, and practice good posture. A sexual relationship may be continued unless your health care provider directs you otherwise. Relieving pain and discomfort Wear a good support bra to prevent discomfort from breast tenderness. Take warm sitz baths to soothe any pain or discomfort caused by hemorrhoids. Use hemorrhoid cream if your health care provider approves. Rest with your legs elevated if you have leg cramps or low back pain. If you develop varicose veins, wear support hose. Elevate your feet for 15 minutes, 3-4 times a day. Limit salt in your diet. Prenatal Care Write down your questions. Take them to your prenatal visits. Keep all your prenatal visits as told by your health  care provider. This is important. Safety Wear your seat belt at all times when driving. Make a list of emergency phone numbers, including numbers for family, friends, the hospital, and police and fire departments. General instructions Ask your health care provider for a referral to a local prenatal education class. Begin classes no later than the beginning of month 6 of your pregnancy. Ask for help if you have counseling or nutritional needs during pregnancy. Your health care provider can offer advice or refer you to specialists for help with various needs. Do not use hot tubs, steam rooms, or saunas. Do not douche or use tampons or scented sanitary pads. Do not cross your legs for long periods of time. Avoid cat litter boxes and soil used by cats. These carry germs that can cause birth defects in the baby and possibly loss of the  fetus by miscarriage or stillbirth. Avoid all smoking, herbs, alcohol, and unprescribed drugs. Chemicals in these products can affect the formation and growth of the baby. Do not use any products that contain nicotine or tobacco, such as cigarettes and e-cigarettes. If you need help quitting, ask your health care provider. Visit your dentist if you have not gone yet during your pregnancy. Use a soft toothbrush to brush your teeth and be gentle when you floss. Contact a health care provider if: You have dizziness. You have mild pelvic cramps, pelvic pressure, or nagging pain in the abdominal area. You have persistent nausea, vomiting, or diarrhea. You have a bad smelling vaginal discharge. You have pain when you urinate. Get help right away if: You have a fever. You are leaking fluid from your vagina. You have spotting or bleeding from your vagina. You have severe abdominal cramping or pain. You have rapid weight gain or weight loss. You have shortness of breath with chest pain. You notice sudden or extreme swelling of your face, hands, ankles, feet, or legs. You  have not felt your baby move in over an hour. You have severe headaches that do not go away when you take medicine. You have vision changes. Summary The second trimester is from week 14 through week 27 (months 4 through 6). It is also a time when the fetus is growing rapidly. Your body goes through many changes during pregnancy. The changes vary from woman to woman. Avoid all smoking, herbs, alcohol, and unprescribed drugs. These chemicals affect the formation and growth your baby. Do not use any tobacco products, such as cigarettes, chewing tobacco, and e-cigarettes. If you need help quitting, ask your health care provider. Contact your health care provider if you have any questions. Keep all prenatal visits as told by your health care provider. This is important. This information is not intended to replace advice given to you by your health care provider. Make sure you discuss any questions you have with your health care provider. Document Released: 06/25/2001 Document Revised: 12/07/2015 Document Reviewed: 09/01/2012 Elsevier Interactive Patient Education  2017 [employer].

## 2023-05-22 LAB — INTEGRATED 2
AFP MoM: 1.08
Alpha-Fetoprotein: 28.4 ng/mL
Crown Rump Length: 75.6 mm
DIA MoM: 0.7
DIA Value: 87.4 pg/mL
Estriol, Unconjugated: 1.46 ng/mL
Gest. Age on Collection Date: 13.4 wk
Gestational Age: 17.4 wk
Maternal Age at EDD: 32.6 a
Nuchal Translucency (NT): 1.6 mm
Nuchal Translucency MoM: 1.02
Number of Fetuses: 1
PAPP-A MoM: 1.9
PAPP-A Value: 1499.5 ng/mL
Sonographer ID#: 309760
Test Results:: NEGATIVE
Weight: 232 [lb_av]
Weight: 232 [lb_av]
hCG MoM: 0.84
hCG Value: 16.8 [IU]/mL
uE3 MoM: 1.45

## 2023-05-22 LAB — INTEGRATED 1
Crown Rump Length: 75.6 mm
Gest. Age on Collection Date: 13.4 wk
Maternal Age at EDD: 32.6 a
Nuchal Translucency (NT): 1.6 mm
Number of Fetuses: 1
PAPP-A Value: 1499.5 ng/mL
Sonographer ID#: 309760
Weight: 232 [lb_av]

## 2023-05-22 LAB — CBC/D/PLT+RPR+RH+ABO+RUBIGG...
Antibody Screen: NEGATIVE
Basophils Absolute: 0 10*3/uL (ref 0.0–0.2)
Basos: 0 %
EOS (ABSOLUTE): 0 10*3/uL (ref 0.0–0.4)
Eos: 0 %
HCV Ab: NONREACTIVE
HIV Screen 4th Generation wRfx: NONREACTIVE
Hematocrit: 44.3 % (ref 34.0–46.6)
Hemoglobin: 14.2 g/dL (ref 11.1–15.9)
Hepatitis B Surface Ag: NEGATIVE
Immature Grans (Abs): 0 10*3/uL (ref 0.0–0.1)
Immature Granulocytes: 0 %
Lymphocytes Absolute: 1.6 10*3/uL (ref 0.7–3.1)
Lymphs: 21 %
MCH: 27.5 pg (ref 26.6–33.0)
MCHC: 32.1 g/dL (ref 31.5–35.7)
MCV: 86 fL (ref 79–97)
Monocytes Absolute: 0.5 10*3/uL (ref 0.1–0.9)
Monocytes: 6 %
Neutrophils Absolute: 5.8 10*3/uL (ref 1.4–7.0)
Neutrophils: 73 %
Platelets: 181 10*3/uL (ref 150–450)
RBC: 5.17 x10E6/uL (ref 3.77–5.28)
RDW: 15.4 % (ref 11.7–15.4)
RPR Ser Ql: NONREACTIVE
Rh Factor: POSITIVE
Rubella Antibodies, IGG: 1.66 {index} (ref >0.99)
WBC: 7.9 10*3/uL (ref 3.4–10.8)

## 2023-05-22 LAB — HCV INTERPRETATION

## 2023-06-09 ENCOUNTER — Encounter: Payer: Self-pay | Admitting: Women's Health

## 2023-06-09 ENCOUNTER — Other Ambulatory Visit: Payer: Self-pay | Admitting: Women's Health

## 2023-06-09 DIAGNOSIS — Z3480 Encounter for supervision of other normal pregnancy, unspecified trimester: Secondary | ICD-10-CM

## 2023-06-09 MED ORDER — ONDANSETRON HCL 4 MG PO TABS
4.0000 mg | ORAL_TABLET | Freq: Three times a day (TID) | ORAL | 6 refills | Status: DC | PRN
Start: 2023-06-09 — End: 2023-10-14

## 2023-06-19 ENCOUNTER — Encounter: Payer: Self-pay | Admitting: Women's Health

## 2023-06-19 ENCOUNTER — Ambulatory Visit (INDEPENDENT_AMBULATORY_CARE_PROVIDER_SITE_OTHER): Payer: BC Managed Care – PPO | Admitting: Women's Health

## 2023-06-19 VITALS — BP 119/83 | HR 86 | Wt 226.0 lb

## 2023-06-19 DIAGNOSIS — Z3A24 24 weeks gestation of pregnancy: Secondary | ICD-10-CM

## 2023-06-19 DIAGNOSIS — Z6841 Body Mass Index (BMI) 40.0 and over, adult: Secondary | ICD-10-CM

## 2023-06-19 DIAGNOSIS — O0992 Supervision of high risk pregnancy, unspecified, second trimester: Secondary | ICD-10-CM

## 2023-06-19 DIAGNOSIS — O0990 Supervision of high risk pregnancy, unspecified, unspecified trimester: Secondary | ICD-10-CM

## 2023-06-19 DIAGNOSIS — Z86718 Personal history of other venous thrombosis and embolism: Secondary | ICD-10-CM

## 2023-06-19 NOTE — Progress Notes (Signed)
HIGH-RISK PREGNANCY VISIT Patient name: Maria Brooks MRN [id number]  Date of birth: [birth year] Chief Complaint:   Routine Prenatal Visit  History of Present Illness:   Maria Brooks is a 32 y.o. 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    Today she reports no complaints. Contractions: Not present. Vag. Bleeding: None.  Movement: Present. denies leaking of fluid.      04/02/2023   11:13 AM 02/03/2020   10:03 AM 11/02/2019   11:14 AM 04/30/2018    3:03 PM  Depression screen PHQ 2/9  Decreased Interest 0 1 0 1  Down, Depressed, Hopeless 0 0 0 0  PHQ - 2 Score 0 1 0 1  Altered sleeping 0 1 0 1  Tired, decreased energy 1 0 1 1  Change in appetite 1 0 0 0  Feeling bad or failure about yourself  0 0 0 0  Trouble concentrating 0 0 0 0  Moving slowly or fidgety/restless 0 0 0 0  Suicidal thoughts 0 0 0 0  PHQ-9 Score 2 2 1 3   Difficult doing work/chores  Not difficult at all Not difficult at all         04/02/2023   11:14 AM 02/03/2020   10:03 AM 11/02/2019   11:15 AM  GAD 7 : Generalized Anxiety Score  Nervous, Anxious, on Edge 0 0 0  Control/stop worrying 0 1 0  Worry too much - different things 0 0 1  Trouble relaxing 0 0 0  Restless 0 0 0  Easily annoyed or irritable 0 0 0  Afraid - awful might happen 0 0 0  Total GAD 7 Score 0 1 1  Anxiety Difficulty  Not difficult at all Not difficult at all     Review of Systems:   Pertinent items are noted in HPI Denies abnormal vaginal discharge w/ itching/odor/irritation, headaches, visual changes, shortness of breath, chest pain, abdominal pain, severe nausea/vomiting, or problems with urination or bowel movements unless otherwise stated above. Pertinent History Reviewed:  Reviewed past medical,surgical, social, obstetrical and family history.  Reviewed problem  list, medications and allergies. Physical Assessment:   Vitals:   06/19/23 1447  BP: 119/83  Pulse: 86  Weight: 226 lb (102.5 kg)  Body mass index is 41.34 kg/m.           Physical Examination:   General appearance: alert, well appearing, and in no distress  Mental status: alert, oriented to person, place, and time  Skin: warm & dry   Extremities: Edema: None    Cardiovascular: normal heart rate noted  Respiratory: normal respiratory effort, no distress  Abdomen: gravid, soft, non-tender  Pelvic: Cervical exam deferred         Fetal Status: Fetal Heart Rate (bpm): 160 Fundal Height: 26 cm Movement: Present    Fetal Surveillance Testing today: doppler   Chaperone: N/A    No results found for this or any previous visit (from the past 24 hour(s)).  Assessment & Plan:  High-risk pregnancy: G3P2002 at [redacted]w[redacted]d with an Estimated Date of Delivery: 10/06/23   1) SVB then C/S, for abruption, wants TOLAC  2) PG BMI >=40, currently 41  3) H/O cerebral venous sinus thrombosis> on lovenox  Meds: No orders of the defined types were placed in this encounter.   Labs/procedures today: none  Treatment Plan:  U/S q4wks   2x/wk nst or weekly BPP @ 34wks   Deliver @ 39-40wks   Reviewed: Preterm labor symptoms and general obstetric precautions including but not limited to vaginal bleeding, contractions, leaking of fluid and fetal movement were reviewed in detail with the patient.  All questions were answered. Does have home bp cuff. Office bp cuff given: not applicable. Check bp weekly, let us know if consistently >140 and/or >90.  Follow-up: Return in about 4 weeks (around 07/17/2023) for HROB, PN2, US:EFW, MD or CNM, in person.   No future appointments.  Orders Placed This Encounter  Procedures   US OB Follow Up   Cheral Marker CNM, [facility] 06/19/2023 2:57 PM

## 2023-06-19 NOTE — Patient Instructions (Signed)
Maria Brooks, thank you for choosing our office today! We appreciate the opportunity to meet your healthcare needs. You may receive a short survey by mail, e-mail, or through [employer]. If you are happy with your care we would appreciate if you could take just a few minutes to complete the survey questions. We read all of your comments and take your feedback very seriously. Thank you again for choosing our office.  Center for [employer] Team at [facility]  [facility] at [facility] ([address] [place], [place] [address]) Entrance C, located off of [address] Free 24/7 valet parking   You will have your sugar test next visit.  Please do not eat or drink anything after midnight the night before you come, not even water.  You will be here for at least two hours.  Please make an appointment online for the bloodwork at [email or web address] for 8:00am (or as close to this as possible). Make sure you select the [facility] service center.   CLASSES: Go to [email or web address] to register for classes (childbirth, breastfeeding, waterbirth, infant CPR, daddy bootcamp, etc.)  Call the office [phone]) or go to [facility] if: You begin to have strong, frequent contractions Your water breaks.  Sometimes it is a big gush of fluid, sometimes it is just a trickle that keeps getting your panties wet or running down your legs You have vaginal bleeding.  It is normal to have a small amount of spotting if your cervix was checked.  You don't feel your baby moving like normal.  If you don't, get you something to eat and drink and lay down and focus on feeling your baby move.   If your baby is still not moving like normal, you should call the office or go to [facility].  Call the office [phone]) or go to [facility] hospital for these signs of pre-eclampsia: Severe headache that does not go away with Tylenol Visual changes- seeing spots, double, blurred vision Pain under your right breast or upper  abdomen that does not go away with Tums or heartburn medicine Nausea and/or vomiting Severe swelling in your hands, feet, and face    [place] Pediatricians/Family Doctors  [facility]): [address] Colette Ribas, [phone]           [facility]: [address], [phone]                [facility]): [address], [phone]  [facility] Department: [address], [place], [phone]    [facility] Pediatricians/Family Doctors [facility]): [address], [phone] [facility]: [address] [place], [phone] [facility]: [address], [phone]  [facility] Doctors  [facility] [phone] [facility]: [address], [phone]   [facility] Doctors [facility]: [address] [phone]  [facility] Doctors  [facility]: [phone], [phone]  Home Blood Pressure Monitoring for Patients   Your provider has recommended that you check your blood pressure (BP) at least once a week at home. If you do not have a blood pressure cuff at home, one will be provided for you. Contact your provider if you have not received your monitor within 1 week.   Helpful Tips for Accurate Home Blood Pressure Checks  Don't smoke, exercise, or drink caffeine 30 minutes before checking  your BP Use the restroom before checking your BP (a full bladder can raise your pressure) Relax in a comfortable upright chair Feet on the ground Left arm resting comfortably on a flat surface at the level of your heart Legs uncrossed Back supported Sit quietly and don't talk Place the cuff on your bare arm Adjust snuggly, so that only two fingertips can fit between your skin and the top of the cuff Check 2 readings separated by at least one  minute Keep a log of your BP readings For a visual, please reference this diagram: http://ccnc.care/bpdiagram  Provider Name: [facility]     Phone: [phone]  Zone 1: ALL CLEAR  Continue to monitor your symptoms:  BP reading is less than 140 (top number) or less than 90 (bottom number)  No right upper stomach pain No headaches or seeing spots No feeling nauseated or throwing up No swelling in face and hands  Zone 2: CAUTION Call your doctor's office for any of the following:  BP reading is greater than 140 (top number) or greater than 90 (bottom number)  Stomach pain under your ribs in the middle or right side Headaches or seeing spots Feeling nauseated or throwing up Swelling in face and hands  Zone 3: EMERGENCY  Seek immediate medical care if you have any of the following:  BP reading is greater than160 (top number) or greater than 110 (bottom number) Severe headaches not improving with Tylenol Serious difficulty catching your breath Any worsening symptoms from Zone 2   Second Trimester of Pregnancy The second trimester is from week 13 through week 28, months 4 through 6. The second trimester is often a time when you feel your best. Your body has also adjusted to being pregnant, and you begin to feel better physically. Usually, morning sickness has lessened or quit completely, you may have more energy, and you may have an increase in appetite. The second trimester is also a time when the fetus is growing rapidly. At the end of the sixth month, the fetus is about 9 inches long and weighs about 1 pounds. You will likely begin to feel the baby move (quickening) between 18 and 20 weeks of the pregnancy. BODY CHANGES Your body goes through many changes during pregnancy. The changes vary from woman to woman.  Your weight will continue to increase. You will notice your lower abdomen bulging out. You may begin to get stretch marks on your hips, abdomen, and breasts. You may  develop headaches that can be relieved by medicines approved by your health care provider. You may urinate more often because the fetus is pressing on your bladder. You may develop or continue to have heartburn as a result of your pregnancy. You may develop constipation because certain hormones are causing the muscles that push waste through your intestines to slow down. You may develop hemorrhoids or swollen, bulging veins (varicose veins). You may have back pain because of the weight gain and pregnancy hormones relaxing your joints between the bones in your pelvis and as a result of a shift in weight and the muscles that support your balance. Your breasts will continue to grow and be tender. Your gums may bleed and may be sensitive to brushing and flossing. Dark spots or blotches (chloasma, mask of pregnancy) may develop on your face. This will likely fade after the baby is born. A dark line from your belly button to the pubic area (linea nigra) may appear. This will likely fade after the  baby is born. You may have changes in your hair. These can include thickening of your hair, rapid growth, and changes in texture. Some women also have hair loss during or after pregnancy, or hair that feels dry or thin. Your hair will most likely return to normal after your baby is born. WHAT TO EXPECT AT YOUR PRENATAL VISITS During a routine prenatal visit: You will be weighed to make sure you and the fetus are growing normally. Your blood pressure will be taken. Your abdomen will be measured to track your baby's growth. The fetal heartbeat will be listened to. Any test results from the previous visit will be discussed. Your health care provider may ask you: How you are feeling. If you are feeling the baby move. If you have had any abnormal symptoms, such as leaking fluid, bleeding, severe headaches, or abdominal cramping. If you have any questions. Other tests that may be performed during your second  trimester include: Blood tests that check for: Low iron levels (anemia). Gestational diabetes (between 24 and 28 weeks). Rh antibodies. Urine tests to check for infections, diabetes, or protein in the urine. An ultrasound to confirm the proper growth and development of the baby. An amniocentesis to check for possible genetic problems. Fetal screens for spina bifida and Down syndrome. HOME CARE INSTRUCTIONS  Avoid all smoking, herbs, alcohol, and unprescribed drugs. These chemicals affect the formation and growth of the baby. Follow your health care provider's instructions regarding medicine use. There are medicines that are either safe or unsafe to take during pregnancy. Exercise only as directed by your health care provider. Experiencing uterine cramps is a good sign to stop exercising. Continue to eat regular, healthy meals. Wear a good support bra for breast tenderness. Do not use hot tubs, steam rooms, or saunas. Wear your seat belt at all times when driving. Avoid raw meat, uncooked cheese, cat litter boxes, and soil used by cats. These carry germs that can cause birth defects in the baby. Take your prenatal vitamins. Try taking a stool softener (if your health care provider approves) if you develop constipation. Eat more high-fiber foods, such as fresh vegetables or fruit and whole grains. Drink plenty of fluids to keep your urine clear or pale yellow. Take warm sitz baths to soothe any pain or discomfort caused by hemorrhoids. Use hemorrhoid cream if your health care provider approves. If you develop varicose veins, wear support hose. Elevate your feet for 15 minutes, 3-4 times a day. Limit salt in your diet. Avoid heavy lifting, wear low heel shoes, and practice good posture. Rest with your legs elevated if you have leg cramps or low back pain. Visit your dentist if you have not gone yet during your pregnancy. Use a soft toothbrush to brush your teeth and be gentle when you floss. A  sexual relationship may be continued unless your health care provider directs you otherwise. Continue to go to all your prenatal visits as directed by your health care provider. SEEK MEDICAL CARE IF:  You have dizziness. You have mild pelvic cramps, pelvic pressure, or nagging pain in the abdominal area. You have persistent nausea, vomiting, or diarrhea. You have a bad smelling vaginal discharge. You have pain with urination. SEEK IMMEDIATE MEDICAL CARE IF:  You have a fever. You are leaking fluid from your vagina. You have spotting or bleeding from your vagina. You have severe abdominal cramping or pain. You have rapid weight gain or loss. You have shortness of breath with chest pain. You  notice sudden or extreme swelling of your face, hands, ankles, feet, or legs. You have not felt your baby move in over an hour. You have severe headaches that do not go away with medicine. You have vision changes. Document Released: 06/25/2001 Document Revised: 07/06/2013 Document Reviewed: 09/01/2012 [facility] Patient Information 2015 [place], [place]. This information is not intended to replace advice given to you by your health care provider. Make sure you discuss any questions you have with your health care provider.

## 2023-07-16 NOTE — L&D Delivery Note (Signed)
 Late entry for 3/29 @ 1830:  -Called to bedside due to deceleration while pushing.  Pt noted to be C/C/+2.   Verbal consent: obtained from patient.  Risks and benefits discussed in detail.  Risks include, but are not limited to the risks of anesthesia, bleeding, infection, damage to maternal tissues, fetal cephalhematoma.  There is also the risk of inability to effect vaginal delivery of the head, or shoulder dystocia that cannot be resolved by established maneuvers, leading to the need for emergency cesarean section.  Bell applied- with pushing 2 pulls performed with some descent.  Vacuum adjusted and awaiting another contraction.   With 2nd contraction, minimal descent with bloody fluid noted.  FHT noted to be 70bpm.  At that time, decision was made to proceed with emergent C-section  The risks of surgery were discussed with the patient including but were not limited to: bleeding which may require transfusion or reoperation; infection which may require antibiotics; injury to bowel, bladder, ureters or other surrounding organs; injury to the fetus; need for additional procedures including hysterectomy in the event of a life-threatening hemorrhage; formation of adhesions; placental abnormalities with subsequent pregnancies; incisional problems; thromboembolic phenomenon and other postoperative/anesthesia complications.  The patient concurred with the proposed plan, giving informed written consent for the procedure.    Patient has been NPO . Anesthesia and OR aware. Preoperative prophylactic antibiotics and SCDs ordered on call to the OR.  To OR when ready.  Myna Hidalgo, DO Attending Obstetrician & Gynecologist, [facility] for [employer], [facility]

## 2023-07-21 ENCOUNTER — Ambulatory Visit: Payer: BC Managed Care – PPO | Admitting: Radiology

## 2023-07-21 ENCOUNTER — Other Ambulatory Visit: Payer: BC Managed Care – PPO

## 2023-07-21 ENCOUNTER — Ambulatory Visit (INDEPENDENT_AMBULATORY_CARE_PROVIDER_SITE_OTHER): Payer: BC Managed Care – PPO | Admitting: Obstetrics & Gynecology

## 2023-07-21 VITALS — BP 112/74 | HR 95 | Wt 227.2 lb

## 2023-07-21 DIAGNOSIS — Z3A28 28 weeks gestation of pregnancy: Secondary | ICD-10-CM

## 2023-07-21 DIAGNOSIS — Z131 Encounter for screening for diabetes mellitus: Secondary | ICD-10-CM

## 2023-07-21 DIAGNOSIS — Z3A29 29 weeks gestation of pregnancy: Secondary | ICD-10-CM | POA: Diagnosis not present

## 2023-07-21 DIAGNOSIS — O0993 Supervision of high risk pregnancy, unspecified, third trimester: Secondary | ICD-10-CM

## 2023-07-21 DIAGNOSIS — O34219 Maternal care for unspecified type scar from previous cesarean delivery: Secondary | ICD-10-CM

## 2023-07-21 DIAGNOSIS — Z86718 Personal history of other venous thrombosis and embolism: Secondary | ICD-10-CM

## 2023-07-21 DIAGNOSIS — Z6841 Body Mass Index (BMI) 40.0 and over, adult: Secondary | ICD-10-CM

## 2023-07-21 DIAGNOSIS — Z98891 History of uterine scar from previous surgery: Secondary | ICD-10-CM

## 2023-07-21 DIAGNOSIS — O0990 Supervision of high risk pregnancy, unspecified, unspecified trimester: Secondary | ICD-10-CM

## 2023-07-21 DIAGNOSIS — O0992 Supervision of high risk pregnancy, unspecified, second trimester: Secondary | ICD-10-CM

## 2023-07-21 NOTE — Progress Notes (Signed)
 HIGH-RISK PREGNANCY VISIT Patient name: Maria Brooks MRN [id number]  Date of birth: 1990/12/31 Chief Complaint:   Routine Prenatal Visit  History of Present Illness:   Maria Brooks is a 33 y.o. G45P2002 female at [redacted]w[redacted]d with an Estimated Date of Delivery: 10/06/23 being seen today for ongoing management of a high-risk pregnancy complicated by:  -TOLAC -h/o thrombosis on Lovenox  daily -Morbid obesity  Today she reports no complaints.   Contractions: Not present. Vag. Bleeding: None.  Movement: Present. denies leaking of fluid.      04/02/2023   11:13 AM 02/03/2020   10:03 AM 11/02/2019   11:14 AM 04/30/2018    3:03 PM  Depression screen PHQ 2/9  Decreased Interest 0 1 0 1  Down, Depressed, Hopeless 0 0 0 0  PHQ - 2 Score 0 1 0 1  Altered sleeping 0 1 0 1  Tired, decreased energy 1 0 1 1  Change in appetite 1 0 0 0  Feeling bad or failure about yourself  0 0 0 0  Trouble concentrating 0 0 0 0  Moving slowly or fidgety/restless 0 0 0 0  Suicidal thoughts 0 0 0 0  PHQ-9 Score 2 2 1 3   Difficult doing work/chores  Not difficult at all Not difficult at all      Current Outpatient Medications  Medication Instructions   enoxaparin  (LOVENOX ) 80 mg, Subcutaneous, Every 24 hours   ondansetron  (ZOFRAN ) 4 mg, Oral, Every 8 hours PRN   prenatal vitamin w/FE, FA (PRENATAL 1 + 1) 27-1 MG TABS tablet 1 tablet, Daily     Review of Systems:   Pertinent items are noted in HPI Denies abnormal vaginal discharge w/ itching/odor/irritation, headaches, visual changes, shortness of breath, chest pain, abdominal pain, severe nausea/vomiting, or problems with urination or bowel movements unless otherwise stated above. Pertinent History Reviewed:  Reviewed past medical,surgical, social, obstetrical and family history.  Reviewed problem list, medications and allergies. Physical Assessment:   Vitals:   07/21/23 1133  BP: 112/74  Pulse: 95  Weight: 227 lb 3.2 oz (103.1 kg)  Body mass index  is 41.56 kg/m.           Physical Examination:   General appearance: alert, well appearing, and in no distress  Mental status: normal mood, behavior, speech, dress, motor activity, and thought processes  Skin: warm & dry   Extremities:      Cardiovascular: normal heart rate noted  Respiratory: normal respiratory effort, no distress  Abdomen: gravid, soft, non-tender  Pelvic: Cervical exam deferred         Fetal Status:     Movement: Present    Fetal Surveillance Testing today: Single active fetus,  cephalic presentaton,  FHR=164bpm Posterior placenta,  AFI = 18.1 cm   75%   SVP = 6.2 cm EFW 49% 1377g  Cervix long and closed  -  Rt ov not seen - Normal Left ov    Chaperone: N/A    No results found for this or any previous visit (from the past 24 hours).   Assessment & Plan:  High-risk pregnancy: G3P2002 at [redacted]w[redacted]d with an Estimated Date of Delivery: 10/06/23   1) h.o thrombosis Continue Lovenox   2) TOLAC - We discussed her history of c-section. Her previous c-section was due to   abruption .  She has a history of   vaginal delivery in first pregnancy - We discussed the risks associated with repeat c-section: bleeding, infection, injury to surrounding organs/tissues I.e. bowel/bladder,  development of scar tissue, wound complications such as wound separation or infection, need for additional surgery and discussed potential for future complications such as placenta accreta spectrum  nm- We discussed the risks associated with TOLAC specifically focusing on the risk of uterine rupture. We discussed with the risk of uterine rupture that while rare it is not easily predicted, that it is a surgical emergency, and it can be potentially catastrophic for mom and baby.  - After counseling, the patient was given the opportunity to ask questions and all questions answered.  - After considering her options, she would like to [facility] - Information provided to the patient  3) BMI >40 Continue growth q  4wks AGA today   Meds: No orders of the defined types were placed in this encounter.   Labs/procedures today: growth scan  Treatment Plan:  PN2 today, continue routine OB care and as outlined above  Reviewed: Preterm labor symptoms and general obstetric precautions including but not limited to vaginal bleeding, contractions, leaking of fluid and fetal movement were reviewed in detail with the patient.  All questions were answered. PT has home bp cuff. Check bp weekly, let us  know if >140/90.   Follow-up: Return in about 2 weeks (around 08/04/2023) for HROB visit and growth ever 4wks.   Future Appointments  Date Time Provider Department Center  08/04/2023  9:50 AM Kizzie Suzen SAUNDERS, CNM [facility]    No orders of the defined types were placed in this encounter.   Marthella Osorno, DO Attending Obstetrician & Gynecologist, [facility] for [employer], [facility]

## 2023-07-21 NOTE — Progress Notes (Signed)
 GA [redacted] weeks gest  Single active fetus,  cephalic presentaton,  FHR=164bpm Posterior placenta,  AFI = 18.1 cm   75%   SVP = 6.2 cm EFW 49% 1377g  Cervix long and closed  -  Rt ov not seen - Normal Left ov

## 2023-07-22 LAB — CBC
Hematocrit: 38.8 % (ref 34.0–46.6)
Hemoglobin: 11.7 g/dL (ref 11.1–15.9)
MCH: 25.4 pg — ABNORMAL LOW (ref 26.6–33.0)
MCHC: 30.2 g/dL — ABNORMAL LOW (ref 31.5–35.7)
MCV: 84 fL (ref 79–97)
Platelets: 183 10*3/uL (ref 150–450)
RBC: 4.61 x10E6/uL (ref 3.77–5.28)
RDW: 13.9 % (ref 11.7–15.4)
WBC: 10.8 10*3/uL (ref 3.4–10.8)

## 2023-07-22 LAB — GLUCOSE TOLERANCE, 2 HOURS W/ 1HR
Glucose, 1 hour: 134 mg/dL (ref 70–179)
Glucose, 2 hour: 82 mg/dL (ref 70–152)
Glucose, Fasting: 86 mg/dL (ref 70–91)

## 2023-07-22 LAB — RPR: RPR Ser Ql: NONREACTIVE

## 2023-07-22 LAB — ANTIBODY SCREEN: Antibody Screen: NEGATIVE

## 2023-07-22 LAB — HIV ANTIBODY (ROUTINE TESTING W REFLEX): HIV Screen 4th Generation wRfx: NONREACTIVE

## 2023-08-04 ENCOUNTER — Ambulatory Visit: Payer: BC Managed Care – PPO | Admitting: Women's Health

## 2023-08-04 ENCOUNTER — Encounter: Payer: Self-pay | Admitting: Women's Health

## 2023-08-04 VITALS — BP 114/75 | HR 97 | Wt 229.0 lb

## 2023-08-04 DIAGNOSIS — Z86718 Personal history of other venous thrombosis and embolism: Secondary | ICD-10-CM

## 2023-08-04 DIAGNOSIS — Z3A31 31 weeks gestation of pregnancy: Secondary | ICD-10-CM

## 2023-08-04 DIAGNOSIS — O0993 Supervision of high risk pregnancy, unspecified, third trimester: Secondary | ICD-10-CM

## 2023-08-04 DIAGNOSIS — Z23 Encounter for immunization: Secondary | ICD-10-CM

## 2023-08-04 DIAGNOSIS — O0990 Supervision of high risk pregnancy, unspecified, unspecified trimester: Secondary | ICD-10-CM

## 2023-08-04 DIAGNOSIS — Z6841 Body Mass Index (BMI) 40.0 and over, adult: Secondary | ICD-10-CM

## 2023-08-04 NOTE — Patient Instructions (Signed)
[place], thank you for choosing our office today! We appreciate the opportunity to meet your healthcare needs. You may receive a short survey by mail, e-mail, or through [employer]. If you are happy with your care we would appreciate if you could take just a few minutes to complete the survey questions. We read all of your comments and take your feedback very seriously. Thank you again for choosing our office.  Center for [employer] Team at [facility]  [facility] at [facility] ([address] [place], [place] [address]) Entrance C, located off of [address] Free 24/7 valet parking   CLASSES: Go to [email or web address] to register for classes (childbirth, breastfeeding, waterbirth, infant CPR, daddy bootcamp, etc.)  Call the office [phone]) or go to [facility] if: You begin to have strong, frequent contractions Your water breaks.  Sometimes it is a big gush of fluid, sometimes it is just a trickle that keeps getting your panties wet or running down your legs You have vaginal bleeding.  It is normal to have a small amount of spotting if your cervix was checked.  You don't feel your baby moving like normal.  If you don't, get you something to eat and drink and lay down and focus on feeling your baby move.   If your baby is still not moving like normal, you should call the office or go to [facility].  Call the office [phone]) or go to [facility] hospital for these signs of pre-eclampsia: Severe headache that does not go away with Tylenol Visual changes- seeing spots, double, blurred vision Pain under your right breast or upper abdomen that does not go away with Tums or heartburn medicine Nausea and/or vomiting Severe swelling in your hands, feet, and face   Tdap Vaccine It is recommended that you get the Tdap vaccine during the third trimester of EACH pregnancy to help protect your baby from getting pertussis (whooping cough) 27-36 weeks is the BEST time to do  this so that you can pass the protection on to your baby. During pregnancy is better than after pregnancy, but if you are unable to get it during pregnancy it will be offered at the hospital.  You can get this vaccine with [place], at the health department, your family doctor, or some local pharmacies Everyone who will be around your baby should also be up-to-date on their vaccines before the baby comes. Adults (who are not pregnant) only need 1 dose of Tdap during adulthood.   [facility] Pediatricians/Family Doctors [facility]): [address] [place]lette Ribas, [phone]           [facility]: [address], [phone]                [facility]): [address], [phone] (call to ask if accepting patients) [facility] Department: [address], [place], [phone]    [facility] Pediatricians/Family Doctors [facility]): [phone], [phone] [facility]: [address] [place], [phone] [facility]: [address], [phone]  [facility] Doctors  [facility]): [phone] [facility]: [address], [phone]   [facility] Doctors [facility]: [address] [phone]  [facility] Family Doctors  [facility]: [phone], [phone]  Home Blood Pressure Monitoring for Patients   Your provider has recommended that you check your  blood pressure (BP) at least once a week at home. If you do not have a blood pressure cuff at home, one will be provided for you. Contact your provider if you have not received your monitor within 1 week.   Helpful Tips for Accurate Home Blood Pressure Checks  Don't smoke, exercise, or drink caffeine 30 minutes before checking your BP Use the restroom before checking your BP (a full bladder can raise your  pressure) Relax in a comfortable upright chair Feet on the ground Left arm resting comfortably on a flat surface at the level of your heart Legs uncrossed Back supported Sit quietly and don't talk Place the cuff on your bare arm Adjust snuggly, so that only two fingertips can fit between your skin and the top of the cuff Check 2 readings separated by at least one minute Keep a log of your BP readings For a visual, please reference this diagram: http://ccnc.care/bpdiagram  Provider Name: [facility]     Phone: [phone]  Zone 1: ALL CLEAR  Continue to monitor your symptoms:  BP reading is less than 140 (top number) or less than 90 (bottom number)  No right upper stomach pain No headaches or seeing spots No feeling nauseated or throwing up No swelling in face and hands  Zone 2: CAUTION Call your doctor's office for any of the following:  BP reading is greater than 140 (top number) or greater than 90 (bottom number)  Stomach pain under your ribs in the middle or right side Headaches or seeing spots Feeling nauseated or throwing up Swelling in face and hands  Zone 3: EMERGENCY  Seek immediate medical care if you have any of the following:  BP reading is greater than160 (top number) or greater than 110 (bottom number) Severe headaches not improving with Tylenol Serious difficulty catching your breath Any worsening symptoms from Zone 2   Third Trimester of Pregnancy The third trimester is from week 29 through week 42, months 7 through 9. The third trimester is a time when the fetus is growing rapidly. At the end of the ninth month, the fetus is about 20 inches in length and weighs 6-10 pounds.  BODY CHANGES Your body goes through many changes during pregnancy. The changes vary from woman to woman.  Your weight will continue to increase. You can expect to gain 25-35 pounds (11-16 kg) by the end of the pregnancy. You may begin to get stretch marks on your hips, abdomen,  and breasts. You may urinate more often because the fetus is moving lower into your pelvis and pressing on your bladder. You may develop or continue to have heartburn as a result of your pregnancy. You may develop constipation because certain hormones are causing the muscles that push waste through your intestines to slow down. You may develop hemorrhoids or swollen, bulging veins (varicose veins). You may have pelvic pain because of the weight gain and pregnancy hormones relaxing your joints between the bones in your pelvis. Backaches may result from overexertion of the muscles supporting your posture. You may have changes in your hair. These can include thickening of your hair, rapid growth, and changes in texture. Some women also have hair loss during or after pregnancy, or hair that feels dry or thin. Your hair will most likely return to normal after your baby is born. Your breasts will continue to grow and be tender. A yellow discharge may leak from your breasts called colostrum. Your belly button may stick out. You may  feel short of breath because of your expanding uterus. You may notice the fetus "dropping," or moving lower in your abdomen. You may have a bloody mucus discharge. This usually occurs a few days to a week before labor begins. Your cervix becomes thin and soft (effaced) near your due date. WHAT TO EXPECT AT YOUR PRENATAL EXAMS  You will have prenatal exams every 2 weeks until week 36. Then, you will have weekly prenatal exams. During a routine prenatal visit: You will be weighed to make sure you and the fetus are growing normally. Your blood pressure is taken. Your abdomen will be measured to track your baby's growth. The fetal heartbeat will be listened to. Any test results from the previous visit will be discussed. You may have a cervical check near your due date to see if you have effaced. At around 36 weeks, your caregiver will check your cervix. At the same time, your  caregiver will also perform a test on the secretions of the vaginal tissue. This test is to determine if a type of bacteria, Group B streptococcus, is present. Your caregiver will explain this further. Your caregiver may ask you: What your birth plan is. How you are feeling. If you are feeling the baby move. If you have had any abnormal symptoms, such as leaking fluid, bleeding, severe headaches, or abdominal cramping. If you have any questions. Other tests or screenings that may be performed during your third trimester include: Blood tests that check for low iron levels (anemia). Fetal testing to check the health, activity level, and growth of the fetus. Testing is done if you have certain medical conditions or if there are problems during the pregnancy. FALSE LABOR You may feel small, irregular contractions that eventually go away. These are called Braxton Hicks contractions, or false labor. Contractions may last for hours, days, or even weeks before true labor sets in. If contractions come at regular intervals, intensify, or become painful, it is best to be seen by your caregiver.  SIGNS OF LABOR  Menstrual-like cramps. Contractions that are 5 minutes apart or less. Contractions that start on the top of the uterus and spread down to the lower abdomen and back. A sense of increased pelvic pressure or back pain. A watery or bloody mucus discharge that comes from the vagina. If you have any of these signs before the 37th week of pregnancy, call your caregiver right away. You need to go to the hospital to get checked immediately. HOME CARE INSTRUCTIONS  Avoid all smoking, herbs, alcohol, and unprescribed drugs. These chemicals affect the formation and growth of the baby. Follow your caregiver's instructions regarding medicine use. There are medicines that are either safe or unsafe to take during pregnancy. Exercise only as directed by your caregiver. Experiencing uterine cramps is a good sign to  stop exercising. Continue to eat regular, healthy meals. Wear a good support bra for breast tenderness. Do not use hot tubs, steam rooms, or saunas. Wear your seat belt at all times when driving. Avoid raw meat, uncooked cheese, cat litter boxes, and soil used by cats. These carry germs that can cause birth defects in the baby. Take your prenatal vitamins. Try taking a stool softener (if your caregiver approves) if you develop constipation. Eat more high-fiber foods, such as fresh vegetables or fruit and whole grains. Drink plenty of fluids to keep your urine clear or pale yellow. Take warm sitz baths to soothe any pain or discomfort caused by hemorrhoids. Use hemorrhoid cream if  your caregiver approves. If you develop varicose veins, wear support hose. Elevate your feet for 15 minutes, 3-4 times a day. Limit salt in your diet. Avoid heavy lifting, wear low heal shoes, and practice good posture. Rest a lot with your legs elevated if you have leg cramps or low back pain. Visit your dentist if you have not gone during your pregnancy. Use a soft toothbrush to brush your teeth and be gentle when you floss. A sexual relationship may be continued unless your caregiver directs you otherwise. Do not travel far distances unless it is absolutely necessary and only with the approval of your caregiver. Take prenatal classes to understand, practice, and ask questions about the labor and delivery. Make a trial run to the hospital. Pack your hospital bag. Prepare the baby's nursery. Continue to go to all your prenatal visits as directed by your caregiver. SEEK MEDICAL CARE IF: You are unsure if you are in labor or if your water has broken. You have dizziness. You have mild pelvic cramps, pelvic pressure, or nagging pain in your abdominal area. You have persistent nausea, vomiting, or diarrhea. You have a bad smelling vaginal discharge. You have pain with urination. SEEK IMMEDIATE MEDICAL CARE IF:  You  have a fever. You are leaking fluid from your vagina. You have spotting or bleeding from your vagina. You have severe abdominal cramping or pain. You have rapid weight loss or gain. You have shortness of breath with chest pain. You notice sudden or extreme swelling of your face, hands, ankles, feet, or legs. You have not felt your baby move in over an hour. You have severe headaches that do not go away with medicine. You have vision changes. Document Released: 06/25/2001 Document Revised: 07/06/2013 Document Reviewed: 09/01/2012 [facility] Patient Information 2015 [place], [place]. This information is not intended to replace advice given to you by your health care provider. Make sure you discuss any questions you have with your health care provider.

## 2023-08-04 NOTE — Progress Notes (Signed)
HIGH-RISK PREGNANCY VISIT Patient name: Maria Brooks MRN [id number]  Date of birth: 1991-06-03 Chief Complaint:   Routine Prenatal Visit  History of Present Illness:   Maria Brooks is a 33 y.o. G77P2002 female at [redacted]w[redacted]d with an Estimated Date of Delivery: 10/06/23 being seen today for ongoing management of a high-risk pregnancy complicated by PG BMI 42, h/o provoked thrombosis.    Today she reports pelvic pressure. Contractions: Not present. Vag. Bleeding: None.  Movement: Present. denies leaking of fluid.      04/02/2023   11:13 AM 02/03/2020   10:03 AM 11/02/2019   11:14 AM 04/30/2018    3:03 PM  Depression screen PHQ 2/9  Decreased Interest 0 1 0 1  Down, Depressed, Hopeless 0 0 0 0  PHQ - 2 Score 0 1 0 1  Altered sleeping 0 1 0 1  Tired, decreased energy 1 0 1 1  Change in appetite 1 0 0 0  Feeling bad or failure about yourself  0 0 0 0  Trouble concentrating 0 0 0 0  Moving slowly or fidgety/restless 0 0 0 0  Suicidal thoughts 0 0 0 0  PHQ-9 Score 2 2 1 3   Difficult doing work/chores  Not difficult at all Not difficult at all         04/02/2023   11:14 AM 02/03/2020   10:03 AM 11/02/2019   11:15 AM  GAD 7 : Generalized Anxiety Score  Nervous, Anxious, on Edge 0 0 0  Control/stop worrying 0 1 0  Worry too much - different things 0 0 1  Trouble relaxing 0 0 0  Restless 0 0 0  Easily annoyed or irritable 0 0 0  Afraid - awful might happen 0 0 0  Total GAD 7 Score 0 1 1  Anxiety Difficulty  Not difficult at all Not difficult at all     Review of Systems:   Pertinent items are noted in HPI Denies abnormal vaginal discharge w/ itching/odor/irritation, headaches, visual changes, shortness of breath, chest pain, abdominal pain, severe nausea/vomiting, or problems with urination or bowel movements unless otherwise stated above. Pertinent History Reviewed:  Reviewed past medical,surgical, social, obstetrical and family history.  Reviewed problem list, medications and  allergies. Physical Assessment:   Vitals:   08/04/23 0947  BP: 114/75  Pulse: 97  Weight: 229 lb (103.9 kg)  Body mass index is 41.88 kg/m.           Physical Examination:   General appearance: alert, well appearing, and in no distress  Mental status: alert, oriented to person, place, and time  Skin: warm & dry   Extremities: Edema: None    Cardiovascular: normal heart rate noted  Respiratory: normal respiratory effort, no distress  Abdomen: gravid, soft, non-tender  Pelvic: Cervical exam deferred         Fetal Status: Fetal Heart Rate (bpm): 150 Fundal Height: 31 cm Movement: Present    Fetal Surveillance Testing today: doppler   Chaperone: N/A    No results found for this or any previous visit (from the past 24 hours).  Assessment & Plan:  High-risk pregnancy: G3P2002 at [redacted]w[redacted]d with an Estimated Date of Delivery: 10/06/23   1) PG BMI 42, stable  2) H/O provoked thrombosis, on Lovenox  3) Prev c/s> vag then c/s for abruption, plans TOLAC (consent signed 1/6)  Meds: No orders of the defined types were placed in this encounter.  Labs/procedures today: Tdap  Treatment Plan:  U/S 24,  28, 32, 36wks   2x/wk nst or weekly BPP @ 34wks   Deliver @ 39-40wks   Reviewed: Preterm labor symptoms and general obstetric precautions including but not limited to vaginal bleeding, contractions, leaking of fluid and fetal movement were reviewed in detail with the patient.  All questions were answered. Does have home bp cuff. Office bp cuff given: not applicable. Check bp weekly, let us know if consistently >140 and/or >90.  Follow-up: Return for As scheduled-add efw u/s to 2/3, start weekly bpp/hrob at 34w til 40w.   Future Appointments  Date Time Provider Department Center  08/18/2023  8:30 AM [facility] - FT IMG 2 [facility] None  08/18/2023  9:30 AM Myna Hidalgo, DO [facility]  09/01/2023  9:10 AM Eure, Amaryllis Dyke, MD [facility]    Orders Placed This Encounter  Procedures   Tdap  vaccine greater than or equal to 7yo IM   Cheral Marker CNM, [facility] 08/04/2023 10:05 AM

## 2023-08-18 ENCOUNTER — Ambulatory Visit: Payer: BC Managed Care – PPO | Admitting: Radiology

## 2023-08-18 ENCOUNTER — Encounter: Payer: Self-pay | Admitting: Obstetrics & Gynecology

## 2023-08-18 ENCOUNTER — Ambulatory Visit: Payer: BC Managed Care – PPO | Admitting: Obstetrics & Gynecology

## 2023-08-18 VITALS — BP 115/78 | HR 101 | Wt 230.0 lb

## 2023-08-18 DIAGNOSIS — O0993 Supervision of high risk pregnancy, unspecified, third trimester: Secondary | ICD-10-CM | POA: Diagnosis not present

## 2023-08-18 DIAGNOSIS — Z3A33 33 weeks gestation of pregnancy: Secondary | ICD-10-CM | POA: Diagnosis not present

## 2023-08-18 DIAGNOSIS — Z86718 Personal history of other venous thrombosis and embolism: Secondary | ICD-10-CM

## 2023-08-18 DIAGNOSIS — O0990 Supervision of high risk pregnancy, unspecified, unspecified trimester: Secondary | ICD-10-CM

## 2023-08-18 DIAGNOSIS — Z98891 History of uterine scar from previous surgery: Secondary | ICD-10-CM | POA: Diagnosis not present

## 2023-08-18 DIAGNOSIS — Z6841 Body Mass Index (BMI) 40.0 and over, adult: Secondary | ICD-10-CM | POA: Diagnosis not present

## 2023-08-18 DIAGNOSIS — O0992 Supervision of high risk pregnancy, unspecified, second trimester: Secondary | ICD-10-CM

## 2023-08-18 NOTE — Progress Notes (Signed)
HIGH-RISK PREGNANCY VISIT Patient name: Maria Brooks MRN [id number]  Date of birth: 05-Sep-1990 Chief Complaint:   Routine Prenatal Visit  History of Present Illness:   Maria Brooks is a 33 y.o. G59P2002 female at [redacted]w[redacted]d with an Estimated Date of Delivery: 10/06/23 being seen today for ongoing management of a high-risk pregnancy complicated by:  -prior C-section, desires TOLAC -provoked thrombosis- on Lovenox -BMI >40  Today she reports no complaints.   Contractions: Not present. Vag. Bleeding: None.  Movement: Present. denies leaking of fluid.      04/02/2023   11:13 AM 02/03/2020   10:03 AM 11/02/2019   11:14 AM 04/30/2018    3:03 PM  Depression screen PHQ 2/9  Decreased Interest 0 1 0 1  Down, Depressed, Hopeless 0 0 0 0  PHQ - 2 Score 0 1 0 1  Altered sleeping 0 1 0 1  Tired, decreased energy 1 0 1 1  Change in appetite 1 0 0 0  Feeling bad or failure about yourself  0 0 0 0  Trouble concentrating 0 0 0 0  Moving slowly or fidgety/restless 0 0 0 0  Suicidal thoughts 0 0 0 0  PHQ-9 Score 2 2 1 3   Difficult doing work/chores  Not difficult at all Not difficult at all      Current Outpatient Medications  Medication Instructions   enoxaparin (LOVENOX) 80 mg, Subcutaneous, Every 24 hours   ondansetron (ZOFRAN) 4 mg, Oral, Every 8 hours PRN   prenatal vitamin w/FE, FA (PRENATAL 1 + 1) 27-1 MG TABS tablet 1 tablet, Daily     Review of Systems:   Pertinent items are noted in HPI Denies abnormal vaginal discharge w/ itching/odor/irritation, headaches, visual changes, shortness of breath, chest pain, abdominal pain, severe nausea/vomiting, or problems with urination or bowel movements unless otherwise stated above. Pertinent History Reviewed:  Reviewed past medical,surgical, social, obstetrical and family history.  Reviewed problem list, medications and allergies. Physical Assessment:   Vitals:   08/18/23 0907  BP: 115/78  Pulse: (!) 101  Weight: 230 lb (104.3 kg)   Body mass index is 42.07 kg/m.           Physical Examination:   General appearance: alert, well appearing, and in no distress  Mental status: normal mood, behavior, speech, dress, motor activity, and thought processes  Skin: warm & dry   Extremities:      Cardiovascular: normal heart rate noted  Respiratory: normal respiratory effort, no distress  Abdomen: gravid, soft, non-tender  Pelvic: Cervical exam deferred         Fetal Status:     Movement: Present    Fetal Surveillance Testing today: Single active fetus in cephalic presentation,  FHR = 409 bpm Fundal Right Lateral pl, gr1,  AFI = 14.8 cm,  53%,  MVP = 6.2 cm EFW 54% 2197g  cervix appears long and closed  -  nl ov's   Chaperone: N/A    No results found for this or any previous visit (from the past 24 hours).   Assessment & Plan:  High-risk pregnancy: G3P2002 at [redacted]w[redacted]d with an Estimated Date of Delivery: 10/06/23   1. Supervision of high risk pregnancy, antepartum (Primary) -routine OB care  2. BMI 40.0-44.9, adult (HCC) Growth AGA, continue q 4wks Scheduled for antepartum testing  3. 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 History of cesarean delivery Desires TOLAC  5. [redacted] weeks gestation of pregnancy   Meds: No orders of  the defined types were placed in this encounter.   Labs/procedures today: growth scan  Treatment Plan:  routine OB care and as outlined above  Reviewed: Preterm labor symptoms and general obstetric precautions including but not limited to vaginal bleeding, contractions, leaking of fluid and fetal movement were reviewed in detail with the patient.  All questions were answered.   Follow-up: Return in about 1 week (around 08/25/2023) for weekly as scheduled and growht in 4 wks.   Future Appointments  Date Time Provider Department Center  08/25/2023 10:45 AM [facility] - FTO[facility]YN [place] [facility] None  08/25/2023 11:30 AM Cheral Marker, CNM [facility]  09/01/2023 10:00 AM [facility] -  FT IMG 2 [facility] None  09/01/2023 10:50 AM Jacklyn Shell, CNM [facility]  09/08/2023  9:15 AM [facility] - FT IMG 2 [facility]FT[facility] None  09/08/2023 10:10 AM Lazaro Arms, MD [facility] FTOBGYN  09/15/2023  9:15 AM [facility] - FTOBGYN [place] [facility]FT[facility]G None  09/15/2023 10:10 AM Cheral Marker, CNM [facility]TO[facility]  09/22/2023  9:15 AM [facility] - FT IMG 2 [facility]FT[facility]G None  09/22/2023 10:10 AM Cheral Marker, CNM [facility] FTO[facility]YN  09/29/2023  9:15 AM [facility] - FTOBGYN [place] [facility]FT[facility]G None  09/29/2023 10:10 AM Myna Hidalgo, DO [facility] FTO[facility]YN  10/06/2023  9:15 AM [facility] - FTOBGYN [place] [facility]FT[facility] None  10/06/2023 10:10 AM Myna Hidalgo, DO [facility] FTO[facility]YN    No orders of the defined types were placed in this encounter.   Myna Hidalgo, DO Attending Obstetrician & Gynecologist, [facility] for [employer], [facility]

## 2023-08-18 NOTE — Progress Notes (Signed)
[place]:  GA = 33 weeks Single active fetus in cephalic presentation,  FHR = 098 bpm Fundal Right Lateral pl, gr1,  AFI = 14.8 cm,  53%,  MVP = 6.2 cm EFW 54% 2197g  cervix appears long and closed  -  nl ov's

## 2023-08-19 ENCOUNTER — Encounter: Payer: Self-pay | Admitting: Women's Health

## 2023-08-25 ENCOUNTER — Encounter: Payer: BC Managed Care – PPO | Admitting: Women's Health

## 2023-08-25 ENCOUNTER — Other Ambulatory Visit: Payer: BC Managed Care – PPO

## 2023-09-01 ENCOUNTER — Encounter: Payer: Self-pay | Admitting: Advanced Practice Midwife

## 2023-09-01 ENCOUNTER — Other Ambulatory Visit: Payer: BC Managed Care – PPO | Admitting: Radiology

## 2023-09-01 ENCOUNTER — Encounter: Payer: BC Managed Care – PPO | Admitting: Obstetrics & Gynecology

## 2023-09-01 ENCOUNTER — Ambulatory Visit: Payer: BC Managed Care – PPO | Admitting: Advanced Practice Midwife

## 2023-09-01 VITALS — BP 115/80 | HR 90 | Wt 231.0 lb

## 2023-09-01 DIAGNOSIS — Z98891 History of uterine scar from previous surgery: Secondary | ICD-10-CM

## 2023-09-01 DIAGNOSIS — Z3A35 35 weeks gestation of pregnancy: Secondary | ICD-10-CM

## 2023-09-01 DIAGNOSIS — G08 Intracranial and intraspinal phlebitis and thrombophlebitis: Secondary | ICD-10-CM

## 2023-09-01 DIAGNOSIS — O0993 Supervision of high risk pregnancy, unspecified, third trimester: Secondary | ICD-10-CM

## 2023-09-01 DIAGNOSIS — O34211 Maternal care for low transverse scar from previous cesarean delivery: Secondary | ICD-10-CM

## 2023-09-01 DIAGNOSIS — O0990 Supervision of high risk pregnancy, unspecified, unspecified trimester: Secondary | ICD-10-CM

## 2023-09-01 NOTE — Patient Instructions (Signed)

## 2023-09-01 NOTE — Progress Notes (Signed)
   LOW-RISK PREGNANCY VISIT Patient name: Maria Brooks MRN [id number]  Date of birth: Jan 22, 1991 Chief Complaint:   Routine Prenatal Visit  History of Present Illness:   Maria Brooks is a 33 y.o. G50P2002 female at [redacted]w[redacted]d with an Estimated Date of Delivery: 10/06/23 being seen today for ongoing management of a low-risk pregnancy.  Today she reports no complaints. Contractions: Not present. Vag. Bleeding: None.  Movement: Present. denies leaking of fluid. Review of Systems:   Pertinent items are noted in HPI Denies abnormal vaginal discharge w/ itching/odor/irritation, headaches, visual changes, shortness of breath, chest pain, abdominal pain, severe nausea/vomiting, or problems with urination or bowel movements unless otherwise stated above. Pertinent History Reviewed:  Reviewed past medical,surgical, social, obstetrical and family history.  Reviewed problem list, medications and allergies. Physical Assessment:   Vitals:   09/01/23 1111  BP: 115/80  Pulse: 90  Weight: 231 lb (104.8 kg)  Body mass index is 42.25 kg/m.        Physical Examination:   General appearance: Well appearing, and in no distress  Mental status: Alert, oriented to person, place, and time  Skin: Warm & dry  Cardiovascular: Normal heart rate noted  Respiratory: Normal respiratory effort, no distress  Abdomen: Soft, gravid, nontender  Pelvic: Cervical exam deferred         Extremities: Edema: None Chaperone:  N/A   Fetal Status: Fetal Heart Rate (bpm): 158 Fundal Height: 36 cm Movement: Present      No results found for this or any previous visit (from the past 24 hours).  Assessment & Plan:    Pregnancy: G3P2002 at [redacted]w[redacted]d 1. [redacted] weeks gestation of pregnancy (Primary)   2. History of cesarean delivery Plans TOLAC, consent signed 07/21/23  3. Morbid obesity with BMI of 40.0-44.9, adult (HCC) Insurance isn't covering BPPs or NSTs, pt declines  4. Cerebral venous sinus thrombosis Continue lovenox  5.  Supervision of high risk pregnancy, antepartum      Meds: No orders of the defined types were placed in this encounter.  Labs/procedures today: none  Plan:  Continue routine obstetrical care  Next visit: prefers in person    Reviewed: Term labor symptoms and general obstetric precautions including but not limited to vaginal bleeding, contractions, leaking of fluid and fetal movement were reviewed in detail with the patient.  All questions were answered. Has home bp cuff. . Check bp weekly, let us know if >140/90.   Follow-up: Return in about 1 week (around 09/08/2023) for LROB.  Future Appointments  Date Time Provider Department Center  09/08/2023  9:10 AM Cheral Marker, CNM [facility]  09/15/2023 10:10 AM Cheral Marker, CNM [facility]  09/22/2023  9:10 AM [facility] NURSE [facility]  09/22/2023  9:30 AM Cheral Marker, CNM [facility]  09/29/2023  8:50 AM [facility] NURSE [facility]  09/29/2023  8:50 AM Myna Hidalgo, DO [facility]  10/06/2023 10:10 AM Myna Hidalgo, DO [facility]    No orders of the defined types were placed in this encounter.  Jacklyn Shell DNP, CNM 09/01/2023 2:05 PM

## 2023-09-08 ENCOUNTER — Encounter: Payer: Self-pay | Admitting: Women's Health

## 2023-09-08 ENCOUNTER — Ambulatory Visit: Payer: BC Managed Care – PPO | Admitting: Women's Health

## 2023-09-08 ENCOUNTER — Other Ambulatory Visit: Payer: BC Managed Care – PPO | Admitting: Radiology

## 2023-09-08 ENCOUNTER — Encounter: Payer: BC Managed Care – PPO | Admitting: Obstetrics & Gynecology

## 2023-09-08 ENCOUNTER — Other Ambulatory Visit (HOSPITAL_COMMUNITY)
Admission: RE | Admit: 2023-09-08 | Discharge: 2023-09-08 | Disposition: A | Discharge status: Home / Self Care | Payer: BC Managed Care – PPO | Source: Ambulatory Visit | Attending: Women's Health | Admitting: Women's Health

## 2023-09-08 VITALS — BP 122/84 | HR 99 | Wt 232.0 lb

## 2023-09-08 DIAGNOSIS — Z86718 Personal history of other venous thrombosis and embolism: Secondary | ICD-10-CM

## 2023-09-08 DIAGNOSIS — O0990 Supervision of high risk pregnancy, unspecified, unspecified trimester: Secondary | ICD-10-CM

## 2023-09-08 DIAGNOSIS — O0993 Supervision of high risk pregnancy, unspecified, third trimester: Secondary | ICD-10-CM | POA: Insufficient documentation

## 2023-09-08 DIAGNOSIS — Z3A36 36 weeks gestation of pregnancy: Secondary | ICD-10-CM | POA: Insufficient documentation

## 2023-09-08 DIAGNOSIS — Z6841 Body Mass Index (BMI) 40.0 and over, adult: Secondary | ICD-10-CM

## 2023-09-08 NOTE — Patient Instructions (Signed)
 Sherald, thank you for choosing our office today! We appreciate the opportunity to meet your healthcare needs. You may receive a short survey by mail, e-mail, or through [employer]. If you are happy with your care we would appreciate if you could take just a few minutes to complete the survey questions. We read all of your comments and take your feedback very seriously. Thank you again for choosing our office.  Center for [employer] Team at [facility]  [facility] at [facility] ([address] [place], [place] [address]) Entrance C, located off of [address] Free 24/7 valet parking   CLASSES: Go to [email or web address] to register for classes (childbirth, breastfeeding, waterbirth, infant CPR, daddy bootcamp, etc.)  Call the office [phone]) or go to [facility] if: You begin to have strong, frequent contractions Your water breaks.  Sometimes it is a big gush of fluid, sometimes it is just a trickle that keeps getting your panties wet or running down your legs You have vaginal bleeding.  It is normal to have a small amount of spotting if your cervix was checked.  You don't feel your baby moving like normal.  If you don't, get you something to eat and drink and lay down and focus on feeling your baby move.   If your baby is still not moving like normal, you should call the office or go to [facility].  Call the office [phone]) or go to [facility] hospital for these signs of pre-eclampsia: Severe headache that does not go away with Tylenol Visual changes- seeing spots, double, blurred vision Pain under your right breast or upper abdomen that does not go away with Tums or heartburn medicine Nausea and/or vomiting Severe swelling in your hands, feet, and face   Tdap Vaccine It is recommended that you get the Tdap vaccine during the third trimester of EACH pregnancy to help protect your baby from getting pertussis (whooping cough) 27-36 weeks is the BEST time to do  this so that you can pass the protection on to your baby. During pregnancy is better than after pregnancy, but if you are unable to get it during pregnancy it will be offered at the hospital.  You can get this vaccine with [place], at the health department, your family doctor, or some local pharmacies Everyone who will be around your baby should also be up-to-date on their vaccines before the baby comes. Adults (who are not pregnant) only need 1 dose of Tdap during adulthood.   [facility] Pediatricians/Family Doctors  Pediatrics [facility]): [address] Colette Ribas, [phone]           [facility]: [address], [phone]                [facility] Medicine [facility]): [address], [phone] (call to ask if accepting patients) [facility] Department: [address], [place], [phone]    [facility] Pediatricians/Family Doctors [facility]): [phone] [address], [phone] [facility]: [address] [place], [phone] [facility]: [address], [phone]  [facility] Doctors  [facility]): [phone] [facility]: [address], [phone]   [facility] Doctors [facility]: [address] [phone]  [facility] Family Doctors  [facility]: [phone], [phone]  Home Blood Pressure Monitoring for Patients   Your provider has recommended that you check your  blood pressure (BP) at least once a week at home. If you do not have a blood pressure cuff at home, one will be provided for you. Contact your provider if you have not received your monitor within 1 week.   Helpful Tips for Accurate Home Blood Pressure Checks  Don't smoke, exercise, or drink caffeine 30 minutes before checking your BP Use the restroom before checking your BP (a full bladder can raise your  pressure) Relax in a comfortable upright chair Feet on the ground Left arm resting comfortably on a flat surface at the level of your heart Legs uncrossed Back supported Sit quietly and don't talk Place the cuff on your bare arm Adjust snuggly, so that only two fingertips can fit between your skin and the top of the cuff Check 2 readings separated by at least one minute Keep a log of your BP readings For a visual, please reference this diagram: http://ccnc.care/bpdiagram  Provider Name: [facility]     Phone: [phone]  Zone 1: ALL CLEAR  Continue to monitor your symptoms:  BP reading is less than 140 (top number) or less than 90 (bottom number)  No right upper stomach pain No headaches or seeing spots No feeling nauseated or throwing up No swelling in face and hands  Zone 2: CAUTION Call your doctor's office for any of the following:  BP reading is greater than 140 (top number) or greater than 90 (bottom number)  Stomach pain under your ribs in the middle or right side Headaches or seeing spots Feeling nauseated or throwing up Swelling in face and hands  Zone 3: EMERGENCY  Seek immediate medical care if you have any of the following:  BP reading is greater than160 (top number) or greater than 110 (bottom number) Severe headaches not improving with Tylenol Serious difficulty catching your breath Any worsening symptoms from Zone 2  Preterm Labor and Birth Information  The normal length of a pregnancy is 39-41 weeks. Preterm labor is when labor starts before 37 completed weeks of pregnancy. What are the risk factors for preterm labor? Preterm labor is more likely to occur in women who: Have certain infections during pregnancy such as a bladder infection, sexually transmitted infection, or infection inside the uterus (chorioamnionitis). Have a shorter-than-normal cervix. Have gone into preterm labor before. Have had surgery on their cervix. Are younger than age 44  or older than age 8. Are African American. Are pregnant with twins or multiple babies (multiple gestation). Take street drugs or smoke while pregnant. Do not gain enough weight while pregnant. Became pregnant shortly after having been pregnant. What are the symptoms of preterm labor? Symptoms of preterm labor include: Cramps similar to those that can happen during a menstrual period. The cramps may happen with diarrhea. Pain in the abdomen or lower back. Regular uterine contractions that may feel like tightening of the abdomen. A feeling of increased pressure in the pelvis. Increased watery or bloody mucus discharge from the vagina. Water breaking (ruptured amniotic sac). Why is it important to recognize signs of preterm labor? It is important to recognize signs of preterm labor because babies who are born prematurely may not be fully developed. This can put them at an increased risk for: Long-term (chronic) heart and lung problems. Difficulty immediately after birth with regulating body systems, including blood sugar, body temperature, heart rate, and breathing rate. Bleeding in the brain. Cerebral palsy. Learning difficulties. Death. These risks are highest for babies who are born before 34 weeks  of pregnancy. How is preterm labor treated? Treatment depends on the length of your pregnancy, your condition, and the health of your baby. It may involve: Having a stitch (suture) placed in your cervix to prevent your cervix from opening too early (cerclage). Taking or being given medicines, such as: Hormone medicines. These may be given early in pregnancy to help support the pregnancy. Medicine to stop contractions. Medicines to help mature the baby's lungs. These may be prescribed if the risk of delivery is high. Medicines to prevent your baby from developing cerebral palsy. If the labor happens before 34 weeks of pregnancy, you may need to stay in the hospital. What should I do if I  think I am in preterm labor? If you think that you are going into preterm labor, call your health care provider right away. How can I prevent preterm labor in future pregnancies? To increase your chance of having a full-term pregnancy: Do not use any tobacco products, such as cigarettes, chewing tobacco, and e-cigarettes. If you need help quitting, ask your health care provider. Do not use street drugs or medicines that have not been prescribed to you during your pregnancy. Talk with your health care provider before taking any herbal supplements, even if you have been taking them regularly. Make sure you gain a healthy amount of weight during your pregnancy. Watch for infection. If you think that you might have an infection, get it checked right away. Make sure to tell your health care provider if you have gone into preterm labor before. This information is not intended to replace advice given to you by your health care provider. Make sure you discuss any questions you have with your health care provider. Document Revised: 10/23/2018 Document Reviewed: 11/22/2015 Elsevier Patient Education  2020 [employer].

## 2023-09-08 NOTE — Progress Notes (Signed)
 HIGH-RISK PREGNANCY VISIT Patient name: Maria Brooks MRN [id number]  Date of birth: 01/20/1991 Chief Complaint:   Routine Prenatal Visit (culture)  History of Present Illness:   Maria Brooks is a 32 y.o. G79P2002 female at [redacted]w[redacted]d with an Estimated Date of Delivery: 10/06/23 being seen today for ongoing management of a high-risk pregnancy complicated by PG BMI 42, h/o provoked thrombosis.    Today she reports  waking up frequently . Contractions: Not present.  .  Movement: Present. denies leaking of fluid.      04/02/2023   11:13 AM 02/03/2020   10:03 AM 11/02/2019   11:14 AM 04/30/2018    3:03 PM  Depression screen PHQ 2/9  Decreased Interest 0 1 0 1  Down, Depressed, Hopeless 0 0 0 0  PHQ - 2 Score 0 1 0 1  Altered sleeping 0 1 0 1  Tired, decreased energy 1 0 1 1  Change in appetite 1 0 0 0  Feeling bad or failure about yourself  0 0 0 0  Trouble concentrating 0 0 0 0  Moving slowly or fidgety/restless 0 0 0 0  Suicidal thoughts 0 0 0 0  PHQ-9 Score 2 2 1 3   Difficult doing work/chores  Not difficult at all Not difficult at all         04/02/2023   11:14 AM 02/03/2020   10:03 AM 11/02/2019   11:15 AM  GAD 7 : Generalized Anxiety Score  Nervous, Anxious, on Edge 0 0 0  Control/stop worrying 0 1 0  Worry too much - different things 0 0 1  Trouble relaxing 0 0 0  Restless 0 0 0  Easily annoyed or irritable 0 0 0  Afraid - awful might happen 0 0 0  Total GAD 7 Score 0 1 1  Anxiety Difficulty  Not difficult at all Not difficult at all     Review of Systems:   Pertinent items are noted in HPI Denies abnormal vaginal discharge w/ itching/odor/irritation, headaches, visual changes, shortness of breath, chest pain, abdominal pain, severe nausea/vomiting, or problems with urination or bowel movements unless otherwise stated above. Pertinent History Reviewed:  Reviewed past medical,surgical, social, obstetrical and family history.  Reviewed problem list, medications and  allergies. Physical Assessment:   Vitals:   09/08/23 0922  BP: 122/84  Pulse: 99  Weight: 232 lb (105.2 kg)  Body mass index is 42.43 kg/m.           Physical Examination:   General appearance: alert, well appearing, and in no distress  Mental status: alert, oriented to person, place, and time  Skin: warm & dry   Extremities: Edema: None    Cardiovascular: normal heart rate noted  Respiratory: normal respiratory effort, no distress  Abdomen: gravid, soft, non-tender  Pelvic: Cervical exam performed  Dilation: 1 Effacement (%): Thick Station: Ballotable  Fetal Status: Fetal Heart Rate (bpm): 160 Fundal Height: 36 cm Movement: Present Presentation: Vertex  Fetal Surveillance Testing today: doppler   Chaperone: N/A    No results found for this or any previous visit (from the past 24 hours).  Assessment & Plan:  High-risk pregnancy: G3P2002 at [redacted]w[redacted]d with an Estimated Date of Delivery: 10/06/23   1) PG BMI 42, last EFW 54% @ 33w, wants to discuss IOL @ 40w visit if still pregnant  2) Prev c/s, wants TOLAC, consent signed 1/6  3) H/O provoked thrombosis> on lovenox 80mg  daily  Meds: No orders of the defined types  were placed in this encounter.   Labs/procedures today: GBS, GC/CT, and SVE  Treatment Plan:  U/S q4wks (declines)   2x/wk nst or weekly BPP (insurance not covering, so she declines)   Deliver @ 39-40.6wks   Reviewed: Preterm labor symptoms and general obstetric precautions including but not limited to vaginal bleeding, contractions, leaking of fluid and fetal movement were reviewed in detail with the patient.  All questions were answered. Does have home bp cuff. Office bp cuff given: not applicable. Check bp weekly, let us know if consistently >140 and/or >90.  Follow-up: Return for As scheduled.   Future Appointments  Date Time Provider Department Center  09/15/2023  8:30 AM Jacklyn Shell, CNM [facility]  09/22/2023  9:10 AM [facility] NURSE  [facility]  09/22/2023  9:30 AM Cheral Marker, CNM [facility]  09/29/2023  8:50 AM [facility] NURSE [facility]  09/29/2023  8:50 AM Myna Hidalgo, DO [facility]  10/06/2023 10:10 AM Myna Hidalgo, DO [facility]    Orders Placed This Encounter  Procedures   Strep Gp B NAA+Rflx   Cheral Marker CNM, [facility] 09/08/2023 9:54 AM

## 2023-09-09 LAB — CERVICOVAGINAL ANCILLARY ONLY
Chlamydia: NEGATIVE
Comment: NEGATIVE
Comment: NORMAL
Neisseria Gonorrhea: NEGATIVE

## 2023-09-10 LAB — STREP GP B NAA+RFLX: Strep Gp B NAA+Rflx: NEGATIVE

## 2023-09-15 ENCOUNTER — Ambulatory Visit (INDEPENDENT_AMBULATORY_CARE_PROVIDER_SITE_OTHER): Payer: BC Managed Care – PPO | Admitting: Advanced Practice Midwife

## 2023-09-15 ENCOUNTER — Encounter: Payer: Self-pay | Admitting: Advanced Practice Midwife

## 2023-09-15 ENCOUNTER — Other Ambulatory Visit: Payer: BC Managed Care – PPO

## 2023-09-15 VITALS — BP 110/75 | HR 85 | Wt 235.0 lb

## 2023-09-15 DIAGNOSIS — Z98891 History of uterine scar from previous surgery: Secondary | ICD-10-CM | POA: Diagnosis not present

## 2023-09-15 DIAGNOSIS — O0990 Supervision of high risk pregnancy, unspecified, unspecified trimester: Secondary | ICD-10-CM

## 2023-09-15 DIAGNOSIS — Z3A37 37 weeks gestation of pregnancy: Secondary | ICD-10-CM | POA: Diagnosis not present

## 2023-09-15 DIAGNOSIS — Z6841 Body Mass Index (BMI) 40.0 and over, adult: Secondary | ICD-10-CM

## 2023-09-15 NOTE — Progress Notes (Signed)
 HIGH-RISK PREGNANCY VISIT Patient name: Maria Brooks MRN [id number]  Date of birth: 09-Jan-1991 Chief Complaint:   Routine Prenatal Visit  History of Present Illness:   Maria Brooks is a 33 y.o. G61P2002 female at [redacted]w[redacted]d with an Estimated Date of Delivery: 10/06/23 being seen today for ongoing management of a high-risk pregnancy complicated by morbid obesity BMI >=40.    Today she reports no complaints. Contractions: Irregular. Vag. Bleeding: None.  Movement: Present. denies leaking of fluid.      04/02/2023   11:13 AM 02/03/2020   10:03 AM 11/02/2019   11:14 AM 04/30/2018    3:03 PM  Depression screen PHQ 2/9  Decreased Interest 0 1 0 1  Down, Depressed, Hopeless 0 0 0 0  PHQ - 2 Score 0 1 0 1  Altered sleeping 0 1 0 1  Tired, decreased energy 1 0 1 1  Change in appetite 1 0 0 0  Feeling bad or failure about yourself  0 0 0 0  Trouble concentrating 0 0 0 0  Moving slowly or fidgety/restless 0 0 0 0  Suicidal thoughts 0 0 0 0  PHQ-9 Score 2 2 1 3   Difficult doing work/chores  Not difficult at all Not difficult at all         04/02/2023   11:14 AM 02/03/2020   10:03 AM 11/02/2019   11:15 AM  GAD 7 : Generalized Anxiety Score  Nervous, Anxious, on Edge 0 0 0  Control/stop worrying 0 1 0  Worry too much - different things 0 0 1  Trouble relaxing 0 0 0  Restless 0 0 0  Easily annoyed or irritable 0 0 0  Afraid - awful might happen 0 0 0  Total GAD 7 Score 0 1 1  Anxiety Difficulty  Not difficult at all Not difficult at all     Review of Systems:   Pertinent items are noted in HPI Denies abnormal vaginal discharge w/ itching/odor/irritation, headaches, visual changes, shortness of breath, chest pain, abdominal pain, severe nausea/vomiting, or problems with urination or bowel movements unless otherwise stated above. Pertinent History Reviewed:  Reviewed past medical,surgical, social, obstetrical and family history.  Reviewed problem list, medications and  allergies. Physical Assessment:   Vitals:   09/15/23 0843  BP: 110/75  Pulse: 85  Weight: 235 lb (106.6 kg)  Body mass index is 42.98 kg/m.           Physical Examination:   General appearance: alert, well appearing, and in no distress  Mental status: alert, oriented to person, place, and time  Skin: warm & dry   Extremities: Edema: None    Cardiovascular: normal heart rate noted  Respiratory: normal respiratory effort, no distress  Abdomen: gravid, soft, non-tender  Pelvic: Cervical exam deferred         Chaperone: Cordelia Pen, RN    Fetal Status: Fetal Heart Rate (bpm): 152 Fundal Height: 38 cm Movement: Present    Fetal Surveillance Testing today: doppler (declines testing d/t not being covered by insurance)     No results found for this or any previous visit (from the past 24 hours).  Assessment & Plan:  High-risk pregnancy: G3P2002 at [redacted]w[redacted]d with an Estimated Date of Delivery: 10/06/23   1. [redacted] weeks gestation of pregnancy (Primary)   2. History of cesarean delivery Plans TOLAC  3. Morbid obesity with BMI of 40.0-44.9, adult (HCC) Prefers 40 week IOL (vs 39)  4. Supervision of high risk pregnancy, antepartum  Meds: No orders of the defined types were placed in this encounter.   Orders: No orders of the defined types were placed in this encounter.    Labs/procedures today: none   Reviewed: Term labor symptoms and general obstetric precautions including but not limited to vaginal bleeding, contractions, leaking of fluid and fetal movement were reviewed in detail with the patient.  All questions were answered. Does have home bp cuff. Office bp cuff given: not applicable. Check bp daily, let us know if consistently >140 and/or >90.  Follow-up: Return for As scheduled.   Future Appointments  Date Time Provider Department Center  09/22/2023  9:10 AM [facility] NURSE [facility]  09/22/2023  9:30 AM Cheral Marker, CNM [facility]  09/29/2023  8:50 AM  [facility] NURSE [facility]  09/29/2023  8:50 AM Myna Hidalgo, DO [facility]  10/06/2023 10:10 AM Myna Hidalgo, DO [facility]    No orders of the defined types were placed in this encounter.  Jacklyn Shell , DNP, CNM [facility] 09/15/2023 8:56 AM

## 2023-09-15 NOTE — Patient Instructions (Signed)

## 2023-09-22 ENCOUNTER — Other Ambulatory Visit: Payer: BC Managed Care – PPO | Admitting: Radiology

## 2023-09-22 ENCOUNTER — Encounter: Payer: Self-pay | Admitting: Women's Health

## 2023-09-22 ENCOUNTER — Ambulatory Visit: Payer: BC Managed Care – PPO | Admitting: Women's Health

## 2023-09-22 ENCOUNTER — Encounter: Payer: BC Managed Care – PPO | Admitting: Women's Health

## 2023-09-22 ENCOUNTER — Other Ambulatory Visit: Payer: BC Managed Care – PPO

## 2023-09-22 VITALS — BP 122/88 | HR 84 | Wt 232.0 lb

## 2023-09-22 DIAGNOSIS — Z6841 Body Mass Index (BMI) 40.0 and over, adult: Secondary | ICD-10-CM | POA: Diagnosis not present

## 2023-09-22 DIAGNOSIS — Z3A38 38 weeks gestation of pregnancy: Secondary | ICD-10-CM

## 2023-09-22 DIAGNOSIS — O0993 Supervision of high risk pregnancy, unspecified, third trimester: Secondary | ICD-10-CM | POA: Diagnosis not present

## 2023-09-22 DIAGNOSIS — O0990 Supervision of high risk pregnancy, unspecified, unspecified trimester: Secondary | ICD-10-CM

## 2023-09-22 NOTE — Progress Notes (Signed)
 HIGH-RISK PREGNANCY VISIT Patient name: Maria Brooks MRN [id number]  Date of birth: 1991-01-15 Chief Complaint:   Routine Prenatal Visit and Non-stress Test  History of Present Illness:   Maria Brooks is a 33 y.o. G80P2002 female at [redacted]w[redacted]d with an Estimated Date of Delivery: 10/06/23 being seen today for ongoing management of a high-risk pregnancy complicated by  PG BMI 42, h/o provoked thrombosis  Today she reports no complaints. Today's NST didn't get cancelled when she cancelled other bpp's/nst's (insurance wasn't covering u/s), so she wants to see if they'll cover NST. Contractions: Not present.  .  Movement: Present. denies leaking of fluid.      04/02/2023   11:13 AM 02/03/2020   10:03 AM 11/02/2019   11:14 AM 04/30/2018    3:03 PM  Depression screen PHQ 2/9  Decreased Interest 0 1 0 1  Down, Depressed, Hopeless 0 0 0 0  PHQ - 2 Score 0 1 0 1  Altered sleeping 0 1 0 1  Tired, decreased energy 1 0 1 1  Change in appetite 1 0 0 0  Feeling bad or failure about yourself  0 0 0 0  Trouble concentrating 0 0 0 0  Moving slowly or fidgety/restless 0 0 0 0  Suicidal thoughts 0 0 0 0  PHQ-9 Score 2 2 1 3   Difficult doing work/chores  Not difficult at all Not difficult at all         04/02/2023   11:14 AM 02/03/2020   10:03 AM 11/02/2019   11:15 AM  GAD 7 : Generalized Anxiety Score  Nervous, Anxious, on Edge 0 0 0  Control/stop worrying 0 1 0  Worry too much - different things 0 0 1  Trouble relaxing 0 0 0  Restless 0 0 0  Easily annoyed or irritable 0 0 0  Afraid - awful might happen 0 0 0  Total GAD 7 Score 0 1 1  Anxiety Difficulty  Not difficult at all Not difficult at all     Review of Systems:   Pertinent items are noted in HPI Denies abnormal vaginal discharge w/ itching/odor/irritation, headaches, visual changes, shortness of breath, chest pain, abdominal pain, severe nausea/vomiting, or problems with urination or bowel movements unless otherwise stated  above. Pertinent History Reviewed:  Reviewed past medical,surgical, social, obstetrical and family history.  Reviewed problem list, medications and allergies. Physical Assessment:   Vitals:   09/22/23 0920  BP: 122/88  Pulse: 84  Weight: 232 lb (105.2 kg)  Body mass index is 42.43 kg/m.           Physical Examination:   General appearance: alert, well appearing, and in no distress  Mental status: alert, oriented to person, place, and time  Skin: warm & dry   Extremities: Edema: None    Cardiovascular: normal heart rate noted  Respiratory: normal respiratory effort, no distress  Abdomen: gravid, soft, non-tender  Pelvic: Cervical exam deferred         Fetal Status:     Movement: Present    Fetal Surveillance Testing today: NST: FHR baseline 155 bpm, Variability: moderate, Accelerations:present, Decelerations:  Absent= Cat 1/reactive Toco: none    Chaperone: N/A  No results found for this or any previous visit (from the past 24 hours).  Assessment & Plan:  High-risk pregnancy: G3P2002 at [redacted]w[redacted]d with an Estimated Date of Delivery: 10/06/23   1) PG BMI 42, last EFW 54% @ 33w, wants to discuss IOL @ 40w visit if  still pregnant   2) Prev c/s, wants TOLAC, consent signed 1/6   3) H/O provoked thrombosis> on lovenox 80mg  daily  Meds: No orders of the defined types were placed in this encounter.   Labs/procedures today: NST  Treatment Plan:  U/S q4wks (declines)   2x/wk nst (checking to see if insurance will cover. Didn't cover BPPs)  Deliver @ 39-40.6wks   Reviewed: Term labor symptoms and general obstetric precautions including but not limited to vaginal bleeding, contractions, leaking of fluid and fetal movement were reviewed in detail with the patient.  All questions were answered. Does have home bp cuff. Office bp cuff given: not applicable. Check bp daily, let us know if consistently >140 and/or >90.  Follow-up: Return for add nst/nurse on Thursdays and NST to 3/24  appt.   Future Appointments  Date Time Provider Department Center  09/29/2023  8:50 AM [facility] NURSE [facility]  09/29/2023  8:50 AM Myna Hidalgo, DO [facility]  10/06/2023 10:10 AM Myna Hidalgo, DO [facility]    No orders of the defined types were placed in this encounter.  Cheral Marker CNM, [facility] 09/22/2023 9:42 AM

## 2023-09-22 NOTE — Patient Instructions (Signed)
 Jaquanda, thank you for choosing our office today! We appreciate the opportunity to meet your healthcare needs. You may receive a short survey by mail, e-mail, or through [employer]. If you are happy with your care we would appreciate if you could take just a few minutes to complete the survey questions. We read all of your comments and take your feedback very seriously. Thank you again for choosing our office.  Center for [employer] Team at [facility]  [facility] at [facility] ([address] [place], [place] [address]) Entrance C, located off of [address] Free 24/7 valet parking   CLASSES: Go to [email or web address] to register for classes (childbirth, breastfeeding, waterbirth, infant CPR, daddy bootcamp, etc.)  Call the office [phone]) or go to [facility] if: You begin to have strong, frequent contractions Your water breaks.  Sometimes it is a big gush of fluid, sometimes it is just a trickle that keeps getting your panties wet or running down your legs You have vaginal bleeding.  It is normal to have a small amount of spotting if your cervix was checked.  You don't feel your baby moving like normal.  If you don't, get you something to eat and drink and lay down and focus on feeling your baby move.   If your baby is still not moving like normal, you should call the office or go to [facility].  Call the office [phone]) or go to [facility] hospital for these signs of pre-eclampsia: Severe headache that does not go away with Tylenol Visual changes- seeing spots, double, blurred vision Pain under your right breast or upper abdomen that does not go away with Tums or heartburn medicine Nausea and/or vomiting Severe swelling in your hands, feet, and face   [facility] Pediatricians/Family Doctors [facility]): [address] [place]lette Rib[place], [phone]           [facility]: [address], [phone]                 [facility]): [address], [phone] (call to ask if accepting patients) [facility]: [address] [place], [phone]    [facility] Pediatricians/Family Doctors [facility]): [address], [phone] [facility]: [address] [place], [phone] [facility]: [address], [phone]  [facility] Doctors  [facility]): [phone] [facility]: [address] [phone]   [facility] Doctors [facility]: [address], [phone]  [facility] Doctors  [facility]: [phone], [phone]  Home Blood Pressure Monitoring for Patients   Your provider has recommended that you check your blood pressure (BP) at least once a week at home. If you do not have a blood pressure cuff at home, one will be provided for you. Contact your provider if you have not received your monitor within 1 week.   Helpful Tips for Accurate Home Blood Pressure Checks  Don't smoke, exercise, or drink caffeine 30 minutes before checking your BP Use the restroom before checking your BP (a full bladder can raise your pressure) Relax in a comfortable upright chair Feet on the ground Left arm resting comfortably on a flat surface at the level of your heart Legs uncrossed Back supported Sit quietly and don't talk Place the cuff on your bare arm Adjust snuggly, so that only two fingertips  can fit between your skin and the top of the cuff Check 2 readings separated by at least one minute Keep a log of your BP readings For a visual, please reference this diagram: http://ccnc.care/bpdiagram  Provider Name: [facility]     Phone: [phone]  Zone 1: ALL CLEAR  Continue to monitor your symptoms:  BP reading is less than 140 (top number) or less than 90 (bottom number)  No right  upper stomach pain No headaches or seeing spots No feeling nauseated or throwing up No swelling in face and hands  Zone 2: CAUTION Call your doctor's office for any of the following:  BP reading is greater than 140 (top number) or greater than 90 (bottom number)  Stomach pain under your ribs in the middle or right side Headaches or seeing spots Feeling nauseated or throwing up Swelling in face and hands  Zone 3: EMERGENCY  Seek immediate medical care if you have any of the following:  BP reading is greater than160 (top number) or greater than 110 (bottom number) Severe headaches not improving with Tylenol Serious difficulty catching your breath Any worsening symptoms from Zone 2   Braxton Hicks Contractions Contractions of the uterus can occur throughout pregnancy, but they are not always a sign that you are in labor. You may have practice contractions called Braxton Hicks contractions. These false labor contractions are sometimes confused with true labor. What are Deberah Pelton contractions? Braxton Hicks contractions are tightening movements that occur in the muscles of the uterus before labor. Unlike true labor contractions, these contractions do not result in opening (dilation) and thinning of the cervix. Toward the end of pregnancy (32-34 weeks), Braxton Hicks contractions can happen more often and may become stronger. These contractions are sometimes difficult to tell apart from true labor because they can be very uncomfortable. You should not feel embarrassed if you go to the hospital with false labor. Sometimes, the only way to tell if you are in true labor is for your health care provider to look for changes in the cervix. The health care provider will do a physical exam and may monitor your contractions. If you are not in true labor, the exam should show that your cervix is not dilating and your water has not broken. If there are no other health problems associated with your  pregnancy, it is completely safe for you to be sent home with false labor. You may continue to have Braxton Hicks contractions until you go into true labor. How to tell the difference between true labor and false labor True labor Contractions last 30-70 seconds. Contractions become very regular. Discomfort is usually felt in the top of the uterus, and it spreads to the lower abdomen and low back. Contractions do not go away with walking. Contractions usually become more intense and increase in frequency. The cervix dilates and gets thinner. False labor Contractions are usually shorter and not as strong as true labor contractions. Contractions are usually irregular. Contractions are often felt in the front of the lower abdomen and in the groin. Contractions may go away when you walk around or change positions while lying down. Contractions get weaker and are shorter-lasting as time goes on. The cervix usually does not dilate or become thin. Follow these instructions at home:  Take over-the-counter and prescription medicines only as told by your health care provider. Keep up with your usual exercises and follow other instructions from your health care provider. Eat and drink lightly if you think  you are going into labor. If Braxton Hicks contractions are making you uncomfortable: Change your position from lying down or resting to walking, or change from walking to resting. Sit and rest in a tub of warm water. Drink enough fluid to keep your urine pale yellow. Dehydration may cause these contractions. Do slow and deep breathing several times an hour. Keep all follow-up prenatal visits as told by your health care provider. This is important. Contact a health care provider if: You have a fever. You have continuous pain in your abdomen. Get help right away if: Your contractions become stronger, more regular, and closer together. You have fluid leaking or gushing from your vagina. You pass  blood-tinged mucus (bloody show). You have bleeding from your vagina. You have low back pain that you never had before. You feel your baby's head pushing down and causing pelvic pressure. Your baby is not moving inside you as much as it used to. Summary Contractions that occur before labor are called Braxton Hicks contractions, false labor, or practice contractions. Braxton Hicks contractions are usually shorter, weaker, farther apart, and less regular than true labor contractions. True labor contractions usually become progressively stronger and regular, and they become more frequent. Manage discomfort from [facility] contractions by changing position, resting in a warm bath, drinking plenty of water, or practicing deep breathing. This information is not intended to replace advice given to you by your health care provider. Make sure you discuss any questions you have with your health care provider. Document Revised: 06/13/2017 Document Reviewed: 11/14/2016 Elsevier Patient Education  2020 [employer].

## 2023-09-25 ENCOUNTER — Ambulatory Visit (INDEPENDENT_AMBULATORY_CARE_PROVIDER_SITE_OTHER): Admitting: *Deleted

## 2023-09-25 VITALS — BP 116/81 | HR 99 | Wt 236.0 lb

## 2023-09-25 DIAGNOSIS — Z3A38 38 weeks gestation of pregnancy: Secondary | ICD-10-CM

## 2023-09-25 DIAGNOSIS — O0990 Supervision of high risk pregnancy, unspecified, unspecified trimester: Secondary | ICD-10-CM

## 2023-09-25 DIAGNOSIS — O0993 Supervision of high risk pregnancy, unspecified, third trimester: Secondary | ICD-10-CM

## 2023-09-25 NOTE — Progress Notes (Unsigned)
   NURSE VISIT- NST  SUBJECTIVE:  Maria Brooks is a 33 y.o. G75P2002 female at [redacted]w[redacted]d, here for a NST for pregnancy complicated by Morbid obesity (BMI >=40).  She reports active fetal movement, contractions: none, vaginal bleeding: none, membranes: intact.   OBJECTIVE:  BP 116/81   Pulse 99   Wt 236 lb (107 kg)   LMP 12/30/2022   BMI 43.16 kg/m   Appears well, no apparent distress  No results found for this or any previous visit (from the past 24 hours).  NST: FHR baseline 150 bpm, Variability: moderate, Accelerations:present, Decelerations:  Absent= Cat 1 /reactive Toco: none   ASSESSMENT: G3P2002 at [redacted]w[redacted]d with Morbid obesity (BMI >=40) NST reactive  PLAN: EFM strip reviewed by Dr. Despina Hidden   Recommendations: keep next appointment as scheduled    Annamarie Dawley  09/25/2023 5:05 PM

## 2023-09-29 ENCOUNTER — Other Ambulatory Visit: Payer: BC Managed Care – PPO

## 2023-09-29 ENCOUNTER — Ambulatory Visit: Payer: BC Managed Care – PPO | Admitting: Obstetrics & Gynecology

## 2023-09-29 ENCOUNTER — Encounter: Payer: Self-pay | Admitting: Obstetrics & Gynecology

## 2023-09-29 VITALS — BP 115/75 | HR 98 | Wt 234.8 lb

## 2023-09-29 DIAGNOSIS — O0993 Supervision of high risk pregnancy, unspecified, third trimester: Secondary | ICD-10-CM | POA: Diagnosis not present

## 2023-09-29 DIAGNOSIS — Z3A39 39 weeks gestation of pregnancy: Secondary | ICD-10-CM

## 2023-09-29 DIAGNOSIS — O0990 Supervision of high risk pregnancy, unspecified, unspecified trimester: Secondary | ICD-10-CM

## 2023-09-29 DIAGNOSIS — Z6841 Body Mass Index (BMI) 40.0 and over, adult: Secondary | ICD-10-CM | POA: Diagnosis not present

## 2023-09-29 NOTE — Progress Notes (Signed)
 HIGH-RISK PREGNANCY VISIT Patient name: Maria Brooks MRN [id number]  Date of birth: 10-17-1990 Chief Complaint:   Routine Prenatal Visit  History of Present Illness:   Maria Brooks is a 33 y.o. G31P2002 female at [redacted]w[redacted]d with an Estimated Date of Delivery: 10/06/23 being seen today for ongoing management of a high-risk pregnancy complicated by:  -Prior C-section, desires TOLAC -on Lovenox due to provoked thrombosis -morbid obesity  Today she reports no complaints.   Contractions: Irregular. Vag. Bleeding: None.  Movement: Present. denies leaking of fluid.      04/02/2023   11:13 AM 02/03/2020   10:03 AM 11/02/2019   11:14 AM 04/30/2018    3:03 PM  Depression screen PHQ 2/9  Decreased Interest 0 1 0 1  Down, Depressed, Hopeless 0 0 0 0  PHQ - 2 Score 0 1 0 1  Altered sleeping 0 1 0 1  Tired, decreased energy 1 0 1 1  Change in appetite 1 0 0 0  Feeling bad or failure about yourself  0 0 0 0  Trouble concentrating 0 0 0 0  Moving slowly or fidgety/restless 0 0 0 0  Suicidal thoughts 0 0 0 0  PHQ-9 Score 2 2 1 3   Difficult doing work/chores  Not difficult at all Not difficult at all      Current Outpatient Medications  Medication Instructions   enoxaparin (LOVENOX) 80 mg, Subcutaneous, Every 24 hours   ondansetron (ZOFRAN) 4 mg, Oral, Every 8 hours PRN   prenatal vitamin w/FE, FA (PRENATAL 1 + 1) 27-1 MG TABS tablet 1 tablet, Daily     Review of Systems:   Pertinent items are noted in HPI Denies abnormal vaginal discharge w/ itching/odor/irritation, headaches, visual changes, shortness of breath, chest pain, abdominal pain, severe nausea/vomiting, or problems with urination or bowel movements unless otherwise stated above. Pertinent History Reviewed:  Reviewed past medical,surgical, social, obstetrical and family history.  Reviewed problem list, medications and allergies. Physical Assessment:   Vitals:   09/29/23 0854  BP: 115/75  Pulse: 98  Weight: 234 lb 12.8 oz  (106.5 kg)  Body mass index is 42.95 kg/m.           Physical Examination:   General appearance: alert, well appearing, and in no distress  Mental status: normal mood, behavior, speech, dress, motor activity, and thought processes  Skin: warm & dry   Extremities: Edema: None    Cardiovascular: normal heart rate noted  Respiratory: normal respiratory effort, no distress  Abdomen: gravid, soft, non-tender  Pelvic: Cervical exam deferred declined exam today        Fetal Status:     Movement: Present    Fetal Surveillance Testing today: NST   NST being performed due to obesity   Fetal Monitoring:  Baseline: 150 bpm, Variability: moderate, Accelerations: present, The accelerations are >15 bpm and more than 2 in 20 minutes, and Decelerations: Absent     Final diagnosis:  Reactive NST   Chaperone: N/A    No results found for this or any previous visit (from the past 24 hours).   Assessment & Plan:  High-risk pregnancy: G3P2002 at [redacted]w[redacted]d with an Estimated Date of Delivery: 10/06/23   -Prior C-section, desires TOLAC -on Lovenox due to provoked thrombosis Pt aware to stop Lovenox if she thinks she is in labor  -morbid obesity Reactive NST today  Meds: No orders of the defined types were placed in this encounter.   Labs/procedures today: NST  Treatment Plan:  routine OB care and as outlined above  Reviewed: Term labor symptoms and general obstetric precautions including but not limited to vaginal bleeding, contractions, leaking of fluid and fetal movement were reviewed in detail with the patient.  All questions were answered. Pt has home bp cuff. Check bp weekly, let us know if >140/90.   Follow-up: Return in about 1 week (around 10/06/2023) for HROB visit (ok for CNM).   Future Appointments  Date Time Provider Department Center  10/06/2023 10:10 AM [facility] NURSE [facility]  10/06/2023 10:10 AM Myna Hidalgo, DO [facility]    No orders of the defined types were  placed in this encounter.   Myna Hidalgo, DO Attending Obstetrician & Gynecologist, [facility] for [employer], [facility]

## 2023-10-06 ENCOUNTER — Ambulatory Visit: Payer: BC Managed Care – PPO | Admitting: Obstetrics & Gynecology

## 2023-10-06 ENCOUNTER — Other Ambulatory Visit: Payer: BC Managed Care – PPO

## 2023-10-06 ENCOUNTER — Telehealth (HOSPITAL_COMMUNITY): Payer: Self-pay | Admitting: *Deleted

## 2023-10-06 ENCOUNTER — Encounter (HOSPITAL_COMMUNITY): Payer: Self-pay | Admitting: *Deleted

## 2023-10-06 ENCOUNTER — Other Ambulatory Visit

## 2023-10-06 ENCOUNTER — Encounter: Payer: Self-pay | Admitting: Obstetrics & Gynecology

## 2023-10-06 VITALS — BP 124/83 | HR 88 | Wt 234.0 lb

## 2023-10-06 DIAGNOSIS — Z3A40 40 weeks gestation of pregnancy: Secondary | ICD-10-CM

## 2023-10-06 DIAGNOSIS — Z98891 History of uterine scar from previous surgery: Secondary | ICD-10-CM

## 2023-10-06 DIAGNOSIS — O0990 Supervision of high risk pregnancy, unspecified, unspecified trimester: Secondary | ICD-10-CM

## 2023-10-06 DIAGNOSIS — O0993 Supervision of high risk pregnancy, unspecified, third trimester: Secondary | ICD-10-CM

## 2023-10-06 DIAGNOSIS — Z6841 Body Mass Index (BMI) 40.0 and over, adult: Secondary | ICD-10-CM

## 2023-10-06 NOTE — Progress Notes (Signed)
 HIGH-RISK PREGNANCY VISIT Patient name: Maria Brooks MRN [id number]  Date of birth: [birth year] Chief Complaint:   No chief complaint on file.  History of Present Illness:   Maria Brooks is a 33 y.o. G46P2002 female at [redacted]w[redacted]d with an Estimated Date of Delivery: 10/06/23 being seen today for ongoing management of a high-risk pregnancy complicated by: -prior C-section- desires TOLAC -morbid obesity -h/o provoked thrombosis- on Lovenox daily  Today she reports no complaints.   Contractions: Irregular. Vag. Bleeding: None.  Movement: Present. denies leaking of fluid.      04/02/2023   11:13 AM 02/03/2020   10:03 AM 11/02/2019   11:14 AM 04/30/2018    3:03 PM  Depression screen PHQ 2/9  Decreased Interest 0 1 0 1  Down, Depressed, Hopeless 0 0 0 0  PHQ - 2 Score 0 1 0 1  Altered sleeping 0 1 0 1  Tired, decreased energy 1 0 1 1  Change in appetite 1 0 0 0  Feeling bad or failure about yourself  0 0 0 0  Trouble concentrating 0 0 0 0  Moving slowly or fidgety/restless 0 0 0 0  Suicidal thoughts 0 0 0 0  PHQ-9 Score 2 2 1 3   Difficult doing work/chores  Not difficult at all Not difficult at all      Current Outpatient Medications  Medication Instructions   enoxaparin (LOVENOX) 80 mg, Subcutaneous, Every 24 hours   ondansetron (ZOFRAN) 4 mg, Oral, Every 8 hours PRN   prenatal vitamin w/FE, FA (PRENATAL 1 + 1) 27-1 MG TABS tablet 1 tablet, Daily     Review of Systems:   Pertinent items are noted in HPI Denies abnormal vaginal discharge w/ itching/odor/irritation, headaches, visual changes, shortness of breath, chest pain, abdominal pain, severe nausea/vomiting, or problems with urination or bowel movements unless otherwise stated above. Pertinent History Reviewed:  Reviewed past medical,surgical, social, obstetrical and family history.  Reviewed problem list, medications and allergies. Physical Assessment:   Vitals:   10/06/23 1011  BP: 124/83  Pulse: 88  Weight: 234 lb  (106.1 kg)  Body mass index is 42.8 kg/m.           Physical Examination:   General appearance: alert, well appearing, and in no distress  Mental status: normal mood, behavior, speech, dress, motor activity, and thought processes  Skin: warm & dry   Extremities:      Cardiovascular: normal heart rate noted  Respiratory: normal respiratory effort, no distress  Abdomen: gravid, soft, non-tender  Pelvic: Cervical exam performed  Dilation: 1 Effacement (%): Thick Station: -3  Fetal Status:     Movement: Present    Fetal Surveillance Testing today: NST  NST being performed due to postdates, obesity   Fetal Monitoring:  Baseline: 150 bpm, Variability: moderate, Accelerations: present, The accelerations are >15 bpm and more than 2 in 20 minutes, and Decelerations: Absent     Final diagnosis:   Reactive NST      Chaperone:  pt declined     No results found for this or any previous visit (from the past 24 hours).   Assessment & Plan:  High-risk pregnancy: G3P2002 at [redacted]w[redacted]d with an Estimated Date of Delivery: 10/06/23   1) Prior C-section, desires TOLAC 2) h/o provoked thrombosis -plan to stop Lovenox evening before IOL  3) obesity -IOL scheduled for 3/29  Meds: No orders of the defined types were placed in this encounter.   Labs/procedures today: NST  Treatment Plan:  IOL scheduled for this Saturday  Reviewed: Term labor symptoms and general obstetric precautions including but not limited to vaginal bleeding, contractions, leaking of fluid and fetal movement were reviewed in detail with the patient.  All questions were answered. Pt has home bp cuff. Check bp weekly, let us know if >140/90.   Follow-up: No follow-ups on file.   Future Appointments  Date Time Provider Department Center  10/11/2023  6:30 AM [facility] SCHED ROOM [facility] None    No orders of the defined types were placed in this encounter.   Myna Hidalgo, DO Attending Obstetrician & Gynecologist, [facility] for [employer], [facility]

## 2023-10-06 NOTE — Telephone Encounter (Signed)
 Preadmission screen

## 2023-10-10 NOTE — H&P (Addendum)
 Attestation of Supervision of Student:  I confirm that I have verified the information documented in the medical resident's note and that I have also personally supervised the history, physical exam and all medical decision making activities.  I have verified that all services and findings are accurately documented in this student's note; and I agree with management and plan as outlined in the documentation. I have also made any necessary editorial changes.  Richardson Landry, CNM [facility] [employer], [facility] 10/11/2023 11:04 AM   OBSTETRIC ADMISSION HISTORY AND PHYSICAL  Maria Brooks is a 33 y.o. female G3P2002 with IUP at [redacted]w[redacted]d by LMP presenting for IOL due to obesity. She reports +FMs, No LOF, no VB, no blurry vision, headaches or peripheral edema, and RUQ pain.  She plans on breast feeding. She request NFP for birth control. She received her prenatal care at [facility]   Dating: By LMP --->  Estimated Date of Delivery: 10/06/23  Sono:    @[redacted]w[redacted]d , CWD, normal anatomy, cephalic presentation, 2179 g, 54% EFW   Prenatal History/Complications:  #Morbid obesity  #Prior c section  #Cerebral venous sinus thrombosis  #GBS positive   Past Medical History: Past Medical History:  Diagnosis Date   Allergy    Chickenpox    Dural venous sinus thrombosis 09/09/2015    Past Surgical History: Past Surgical History:  Procedure Laterality Date   CESAREAN SECTION  04/29/2020   Procedure: CESAREAN SECTION;  Surgeon: Lazaro Arms, MD;  Location: [facility] LD [facility]S;  Service: Obstetrics;;   none      Obstetrical History: OB History     Gravida  3   Para  2   Term  2   Preterm  0   AB  0   Living  2      SAB  0   IAB  0   Ectopic  0   Multiple  0   Live Births  2           Social History Social History   Socioeconomic History   Marital status: Married    Spouse name: Janaysha Depaulo   Number of children: 1   Years of education: Not on file    Highest education level: Not on file  Occupational History   Not on file  Tobacco Use   Smoking status: Never   Smokeless tobacco: Never  Vaping Use   Vaping status: Never Used  Substance and Sexual Activity   Alcohol use: Not Currently    Alcohol/week: 0.0 standard drinks of alcohol    Comment: rarely   Drug use: No   Sexual activity: Yes    Birth control/protection: None  Other Topics Concern   Not on file  Social History Narrative   Married.    Works as an TEFL teacher.   Enjoys resorting furniture, art.    Social Drivers of Corporate investment banker Strain: Low Risk  (04/02/2023)   Overall Financial Resource Strain (CARDIA)    Difficulty of Paying Living Expenses: Not hard at all  Food Insecurity: No Food Insecurity (10/11/2023)   Hunger Vital Sign    Worried About Running Out of Food in the Last Year: Never true    Ran Out of Food in the Last Year: Never true  Transportation Needs: No Transportation Needs (10/11/2023)   PRAPARE - Administrator, Civil Service (Medical): No    Lack of Transportation (Non-Medical): No  Physical Activity: Insufficiently Active (  04/02/2023)   Exercise Vital Sign    Days of Exercise per Week: 2 days    Minutes of Exercise per Session: 20 min  Stress: No Stress Concern Present (04/02/2023)   [employer] of Occupational Health - Occupational Stress Questionnaire    Feeling of Stress : Not at all  Social Connections: Socially Integrated (04/02/2023)   Social Connection and Isolation Panel [NHANES]    Frequency of Communication with Friends and Family: More than three times a week    Frequency of Social Gatherings with Friends and Family: More than three times a week    Attends Religious Services: More than 4 times per year    Active Member of [employer] or Organizations: Yes    Attends Engineer, structural: More than 4 times per year    Marital Status: Married    Family History: Family History  Problem Relation  Age of Onset   Clotting disorder Mother        reportedly present before developing lung cancer   Hyperlipidemia Father    Hypertension Father    Stroke Paternal Grandfather    Heart disease Paternal Grandfather    Cancer Maternal Grandmother        lung   Cataracts Maternal Grandmother    Alzheimer's disease Maternal Grandfather    Heart failure Paternal Grandmother     Allergies: Allergies  Allergen Reactions   Penicillins Other (See Comments)    Family history of allergic reaction    Medications Prior to Admission  Medication Sig Dispense Refill Last Dose/Taking   ondansetron (ZOFRAN) 4 MG tablet Take 1 tablet (4 mg total) by mouth every 8 (eight) hours as needed. 20 tablet 6 10/11/2023 at  6:00 AM   prenatal vitamin w/FE, FA (PRENATAL 1 + 1) 27-1 MG TABS tablet Take 1 tablet by mouth daily at 12 noon.   10/10/2023 at 10:00 PM   enoxaparin (LOVENOX) 80 MG/0.8ML injection Inject 0.8 mLs (80 mg total) into the skin daily. (Patient not taking: Reported on 10/06/2023) 72 mL 2 10/09/2023 at 11:00 PM     Review of Systems   All systems reviewed and negative except as stated in HPI  Blood pressure 114/76, pulse (!) 104, temperature 98.4 F (36.9 C), temperature source Oral, resp. rate 16, height 5\' 2"  (1.575 m), weight 107 kg, last menstrual period 12/30/2022. General appearance: alert and no distress Lungs: normal WOB on RA Heart: regular rate Abdomen: soft, non-tender; bowel sounds normal Pelvic: normal female genitalia Extremities: Homans sign is negative, no sign of DVT Presentation: cephalic Fetal monitoringBaseline: 150 bpm, Variability: Good {> 6 bpm), and Accelerations: Reactive Uterine activity: occasional Dilation: 1 Effacement (%): Thick Station: -3 Exam by:: Verdie Drown, RN   Prenatal labs: ABO, Rh: --/--/AB POS (03/29 7829) Antibody: NEG (03/29 5621) Rubella: 1.66 (09/18 1229) RPR: Non Reactive (01/06 0832)  HBsAg: Negative (09/18 1229)  HIV: Non Reactive  (01/06 0832)  GBS: --Theda Sers (02/24 0237)   ; GBS urine positive  Lab Results  Component Value Date   GBS Positive 04/14/2023   GTT: WNL Genetic screening: LR Anatomy US: WNL  Immunization History  Administered Date(s) Administered   HPV 9-valent 09/27/2014, 12/08/2014   Influenza,inj,Quad PF,6+ Mos 04/30/2018, 05/01/2020   Tdap 08/18/2018, 02/03/2020, 08/04/2023    Prenatal Transfer Tool  Maternal Diabetes: No Genetic Screening: Normal Maternal Ultrasounds/Referrals: Normal Fetal Ultrasounds or other Referrals:  None Maternal Substance Abuse:  No Significant Maternal Medications:  Meds include: Other: lovenox Significant Maternal Lab Results:  Group B Strep positive  Number of Prenatal Visits:greater than 3 verified prenatal visits Maternal Vaccinations:TDap Other Comments:  None   Results for orders placed or performed during the hospital encounter of 10/11/23 (from the past 24 hours)  Type and screen [facility]   Collection Time: 10/11/23  8:38 AM  Result Value Ref Range   ABO/RH(D) AB POS    Antibody Screen NEG    Sample Expiration      10/14/2023,2359 Performed at [facility], [address], [place], [place] [address]   CBC   Collection Time: 10/11/23  8:41 AM  Result Value Ref Range   WBC 7.7 4.0 - 10.5 K/uL   RBC 4.55 3.87 - 5.11 MIL/uL   Hemoglobin 10.9 (L) 12.0 - 15.0 g/dL   HCT 60.4 (L) 54.0 - 98.1 %   MCV 78.7 (L) 80.0 - 100.0 fL   MCH 24.0 (L) 26.0 - 34.0 pg   MCHC 30.4 30.0 - 36.0 g/dL   RDW 19.1 (H) 47.8 - 29.5 %   Platelets 222 150 - 400 K/uL   nRBC 0.0 0.0 - 0.2 %  Comprehensive metabolic panel with GFR   Collection Time: 10/11/23  8:41 AM  Result Value Ref Range   Sodium 135 135 - 145 mmol/L   Potassium 4.2 3.5 - 5.1 mmol/L   Chloride 104 98 - 111 mmol/L   CO2 19 (L) 22 - 32 mmol/L   Glucose, Bld 77 70 - 99 mg/dL   BUN 8 6 - 20 mg/dL   Creatinine, Ser 6.21 0.44 - 1.00 mg/dL   Calcium 9.0 8.9 - 30.8 mg/dL    Total Protein 5.7 (L) 6.5 - 8.1 g/dL   Albumin 2.7 (L) 3.5 - 5.0 g/dL   AST 36 15 - 41 U/L   ALT 30 0 - 44 U/L   Alkaline Phosphatase 111 38 - 126 U/L   Total Bilirubin 0.5 0.0 - 1.2 mg/dL   GFR, Estimated >65 >78 mL/min   Anion gap 12 5 - 15    Patient Active Problem List   Diagnosis Date Noted   Encounter for induction of labor 10/11/2023   Intrauterine pregnancy 10/11/2023   History of cesarean delivery 04/02/2023   Supervision of high risk pregnancy, antepartum 03/31/2023   Morbid obesity with BMI of 40.0-44.9, adult (HCC) 11/02/2019   Cerebral venous sinus thrombosis 10/13/2015    Assessment/Plan:  Maria Brooks is a 33 y.o. G3P2002 at [redacted]w[redacted]d here for IOL due to morbid obesity. Prior c section due to abruption wishes for TOLAC.   #Labor: Induction of labor. Pitocin  #Pain: Epidural prn #FWB: Reactive #GBS status:  Positive (manually entered at 15w), repeat at 36 weeks was negative. Hx of GBS during last pregnancy for which she was treated. Family hx anaphylaxis PCN. Elected not to treat at this time d/t negative repeat, mixed urogenital flora urine culture with no clear evidence of GBS, and no previous hx of GBS sepsis #Feeding: Breastmilk  #Reproductive Life planning: Natural Family Planning #Circ:  yes- if boy. Surprise baby.  #Cerebral venous sinus thrombosis: on Lovenex. Last taken Thursday 03/27  [name], DO  10/11/2023, 10:16 AM

## 2023-10-11 ENCOUNTER — Other Ambulatory Visit: Payer: Self-pay

## 2023-10-11 ENCOUNTER — Other Ambulatory Visit: Payer: Self-pay | Admitting: Obstetrics and Gynecology

## 2023-10-11 ENCOUNTER — Inpatient Hospital Stay (HOSPITAL_COMMUNITY)

## 2023-10-11 ENCOUNTER — Inpatient Hospital Stay (HOSPITAL_COMMUNITY): Admitting: Anesthesiology

## 2023-10-11 ENCOUNTER — Encounter (HOSPITAL_COMMUNITY)
Admission: RE | Disposition: A | Discharge status: Home / Self Care | Payer: Self-pay | Source: Home / Self Care | Attending: Obstetrics & Gynecology

## 2023-10-11 ENCOUNTER — Inpatient Hospital Stay (HOSPITAL_COMMUNITY)
Admission: RE | Admit: 2023-10-11 | Discharge: 2023-10-14 | DRG: 788 | Disposition: A | Discharge status: Home / Self Care | Attending: Obstetrics & Gynecology | Admitting: Obstetrics & Gynecology

## 2023-10-11 ENCOUNTER — Encounter (HOSPITAL_COMMUNITY): Payer: Self-pay | Admitting: Obstetrics & Gynecology

## 2023-10-11 DIAGNOSIS — O873 Cerebral venous thrombosis in the puerperium: Secondary | ICD-10-CM | POA: Diagnosis present

## 2023-10-11 DIAGNOSIS — D62 Acute posthemorrhagic anemia: Secondary | ICD-10-CM | POA: Diagnosis not present

## 2023-10-11 DIAGNOSIS — Z3490 Encounter for supervision of normal pregnancy, unspecified, unspecified trimester: Secondary | ICD-10-CM | POA: Diagnosis present

## 2023-10-11 DIAGNOSIS — Z98891 History of uterine scar from previous surgery: Principal | ICD-10-CM

## 2023-10-11 DIAGNOSIS — O99214 Obesity complicating childbirth: Secondary | ICD-10-CM | POA: Diagnosis present

## 2023-10-11 DIAGNOSIS — Z880 Allergy status to penicillin: Secondary | ICD-10-CM | POA: Diagnosis not present

## 2023-10-11 DIAGNOSIS — O9081 Anemia of the puerperium: Secondary | ICD-10-CM | POA: Diagnosis not present

## 2023-10-11 DIAGNOSIS — O99824 Streptococcus B carrier state complicating childbirth: Secondary | ICD-10-CM | POA: Diagnosis present

## 2023-10-11 DIAGNOSIS — O480 Post-term pregnancy: Secondary | ICD-10-CM | POA: Diagnosis not present

## 2023-10-11 DIAGNOSIS — O34211 Maternal care for low transverse scar from previous cesarean delivery: Secondary | ICD-10-CM | POA: Diagnosis present

## 2023-10-11 DIAGNOSIS — O99820 Streptococcus B carrier state complicating pregnancy: Secondary | ICD-10-CM | POA: Diagnosis not present

## 2023-10-11 DIAGNOSIS — Z8249 Family history of ischemic heart disease and other diseases of the circulatory system: Secondary | ICD-10-CM | POA: Diagnosis not present

## 2023-10-11 DIAGNOSIS — Z3A40 40 weeks gestation of pregnancy: Secondary | ICD-10-CM

## 2023-10-11 DIAGNOSIS — O0990 Supervision of high risk pregnancy, unspecified, unspecified trimester: Secondary | ICD-10-CM

## 2023-10-11 LAB — CBC
HCT: 35.8 % — ABNORMAL LOW (ref 36.0–46.0)
HCT: 33.2 % — ABNORMAL LOW (ref 36.0–46.0)
Hemoglobin: 10.9 g/dL — ABNORMAL LOW (ref 12.0–15.0)
Hemoglobin: 10.5 g/dL — ABNORMAL LOW (ref 12.0–15.0)
MCH: 24 pg — ABNORMAL LOW (ref 26.0–34.0)
MCH: 25.9 pg — ABNORMAL LOW (ref 26.0–34.0)
MCHC: 30.4 g/dL (ref 30.0–36.0)
MCHC: 31.6 g/dL (ref 30.0–36.0)
MCV: 78.7 fL — ABNORMAL LOW (ref 80.0–100.0)
MCV: 82 fL (ref 80.0–100.0)
Platelets: 222 10*3/uL (ref 150–400)
Platelets: 161 10*3/uL (ref 150–400)
RBC: 4.55 MIL/uL (ref 3.87–5.11)
RBC: 4.05 MIL/uL (ref 3.87–5.11)
RDW: 17.3 % — ABNORMAL HIGH (ref 11.5–15.5)
RDW: 17.3 % — ABNORMAL HIGH (ref 11.5–15.5)
WBC: 7.7 10*3/uL (ref 4.0–10.5)
WBC: 18.9 10*3/uL — ABNORMAL HIGH (ref 4.0–10.5)
nRBC: 0 % (ref 0.0–0.2)
nRBC: 0 % (ref 0.0–0.2)

## 2023-10-11 LAB — COMPREHENSIVE METABOLIC PANEL WITH GFR
ALT: 30 U/L (ref 0–44)
AST: 36 U/L (ref 15–41)
Albumin: 2.7 g/dL — ABNORMAL LOW (ref 3.5–5.0)
Alkaline Phosphatase: 111 U/L (ref 38–126)
Anion gap: 12 (ref 5–15)
BUN: 8 mg/dL (ref 6–20)
CO2: 19 mmol/L — ABNORMAL LOW (ref 22–32)
Calcium: 9 mg/dL (ref 8.9–10.3)
Chloride: 104 mmol/L (ref 98–111)
Creatinine, Ser: 0.7 mg/dL (ref 0.44–1.00)
GFR, Estimated: >60 mL/min (ref >60)
Glucose, Bld: 77 mg/dL (ref 70–99)
Potassium: 4.2 mmol/L (ref 3.5–5.1)
Sodium: 135 mmol/L (ref 135–145)
Total Bilirubin: 0.5 mg/dL (ref 0.0–1.2)
Total Protein: 5.7 g/dL — ABNORMAL LOW (ref 6.5–8.1)

## 2023-10-11 LAB — PREPARE RBC (CROSSMATCH)

## 2023-10-11 LAB — RPR: RPR Ser Ql: NONREACTIVE

## 2023-10-11 SURGERY — Surgical Case
Anesthesia: Epidural

## 2023-10-11 MED ORDER — SCOPOLAMINE 1 MG/3DAYS TD PT72
1.0000 | MEDICATED_PATCH | Freq: Once | TRANSDERMAL | Status: DC
  Administered 2023-10-11: 1.5 mg via TRANSDERMAL
  Filled 2023-10-11: qty 1

## 2023-10-11 MED ORDER — ACETAMINOPHEN 325 MG PO TABS: 650.0000 mg | ORAL_TABLET | ORAL | Status: DC | PRN

## 2023-10-11 MED ORDER — DEXAMETHASONE SODIUM PHOSPHATE 10 MG/ML IJ SOLN
INTRAMUSCULAR | Status: DC | PRN
  Administered 2023-10-11: 10 mg via INTRAVENOUS

## 2023-10-11 MED ORDER — ENOXAPARIN SODIUM 60 MG/0.6ML IJ SOSY
55.0000 mg | PREFILLED_SYRINGE | INTRAMUSCULAR | Status: DC
  Administered 2023-10-12 – 2023-10-14 (×3): 55 mg via SUBCUTANEOUS
  Filled 2023-10-11 (×3): qty 0.6

## 2023-10-11 MED ORDER — ONDANSETRON HCL 4 MG/2ML IJ SOLN
INTRAMUSCULAR | Status: DC | PRN
  Administered 2023-10-11: 4 mg via INTRAVENOUS

## 2023-10-11 MED ORDER — CEFAZOLIN SODIUM-DEXTROSE 2-4 GM/100ML-% IV SOLN: 2.0000 g | Freq: Once | INTRAVENOUS | Status: DC

## 2023-10-11 MED ORDER — MAGNESIUM HYDROXIDE 400 MG/5ML PO SUSP: 30.0000 mL | ORAL | Status: DC | PRN

## 2023-10-11 MED ORDER — FENTANYL CITRATE (PF) 100 MCG/2ML IJ SOLN
INTRAMUSCULAR | Status: AC
  Filled 2023-10-11: qty 2

## 2023-10-11 MED ORDER — OXYCODONE HCL 5 MG PO TABS: 5.0000 mg | ORAL_TABLET | ORAL | Status: DC | PRN

## 2023-10-11 MED ORDER — PHENYLEPHRINE 80 MCG/ML (10ML) SYRINGE FOR IV PUSH (FOR BLOOD PRESSURE SUPPORT): 80.0000 ug | PREFILLED_SYRINGE | INTRAVENOUS | Status: DC | PRN

## 2023-10-11 MED ORDER — FENTANYL CITRATE (PF) 100 MCG/2ML IJ SOLN
50.0000 ug | INTRAMUSCULAR | Status: DC | PRN
  Administered 2023-10-11: 50 ug via INTRAVENOUS
  Filled 2023-10-11: qty 2

## 2023-10-11 MED ORDER — LACTATED RINGERS IV SOLN: INTRAVENOUS | Status: DC

## 2023-10-11 MED ORDER — DIBUCAINE (PERIANAL) 1 % EX OINT: 1.0000 | TOPICAL_OINTMENT | CUTANEOUS | Status: DC | PRN

## 2023-10-11 MED ORDER — OXYTOCIN-SODIUM CHLORIDE 30-0.9 UT/500ML-% IV SOLN
2.5000 [IU]/h | INTRAVENOUS | Status: DC
Start: 2023-10-11 — End: 2023-10-11
  Filled 2023-10-11: qty 500

## 2023-10-11 MED ORDER — ACETAMINOPHEN 10 MG/ML IV SOLN
INTRAVENOUS | Status: AC
  Filled 2023-10-11: qty 100

## 2023-10-11 MED ORDER — IBUPROFEN 600 MG PO TABS
600.0000 mg | ORAL_TABLET | Freq: Four times a day (QID) | ORAL | Status: DC
  Administered 2023-10-13 – 2023-10-14 (×6): 600 mg via ORAL
  Filled 2023-10-11 (×6): qty 1

## 2023-10-11 MED ORDER — OXYCODONE HCL 5 MG/5ML PO SOLN: 5.0000 mg | Freq: Once | ORAL | Status: DC | PRN

## 2023-10-11 MED ORDER — TERBUTALINE SULFATE 1 MG/ML IJ SOLN: 0.2500 mg | Freq: Once | INTRAMUSCULAR | Status: DC | PRN

## 2023-10-11 MED ORDER — NALOXONE HCL 0.4 MG/ML IJ SOLN: 0.4000 mg | INTRAMUSCULAR | Status: DC | PRN

## 2023-10-11 MED ORDER — EPHEDRINE 5 MG/ML INJ: 10.0000 mg | INTRAVENOUS | Status: DC | PRN

## 2023-10-11 MED ORDER — FENTANYL-BUPIVACAINE-NACL 0.5-0.125-0.9 MG/250ML-% EP SOLN
12.0000 mL/h | EPIDURAL | Status: DC | PRN
  Filled 2023-10-11: qty 250

## 2023-10-11 MED ORDER — NALOXONE HCL 4 MG/10ML IJ SOLN: 1.0000 ug/kg/h | INTRAVENOUS | Status: DC | PRN

## 2023-10-11 MED ORDER — LIDOCAINE-EPINEPHRINE 2 %-1:100000 IJ SOLN: INTRAMUSCULAR | Status: DC | PRN

## 2023-10-11 MED ORDER — ZOLPIDEM TARTRATE 5 MG PO TABS: 5.0000 mg | ORAL_TABLET | Freq: Every evening | ORAL | Status: DC | PRN

## 2023-10-11 MED ORDER — MEPERIDINE HCL 25 MG/ML IJ SOLN: 6.2500 mg | INTRAMUSCULAR | Status: DC | PRN

## 2023-10-11 MED ORDER — ONDANSETRON HCL 4 MG/2ML IJ SOLN: 4.0000 mg | Freq: Three times a day (TID) | INTRAMUSCULAR | Status: DC | PRN

## 2023-10-11 MED ORDER — OXYTOCIN BOLUS FROM INFUSION: 333.0000 mL | Freq: Once | INTRAVENOUS | Status: DC

## 2023-10-11 MED ORDER — OXYCODONE-ACETAMINOPHEN 5-325 MG PO TABS: 2.0000 | ORAL_TABLET | ORAL | Status: DC | PRN

## 2023-10-11 MED ORDER — STERILE WATER FOR IRRIGATION IR SOLN
Status: DC | PRN
  Administered 2023-10-11: 1000 mL

## 2023-10-11 MED ORDER — LIDOCAINE-EPINEPHRINE (PF) 2 %-1:200000 IJ SOLN: INTRAMUSCULAR | Status: DC | PRN

## 2023-10-11 MED ORDER — LIDOCAINE HCL (PF) 1 % IJ SOLN
INTRAMUSCULAR | Status: DC | PRN
  Administered 2023-10-11: 5 mL via EPIDURAL

## 2023-10-11 MED ORDER — ONDANSETRON HCL 4 MG/2ML IJ SOLN: 4.0000 mg | Freq: Once | INTRAMUSCULAR | Status: DC | PRN

## 2023-10-11 MED ORDER — KETOROLAC TROMETHAMINE 30 MG/ML IJ SOLN
INTRAMUSCULAR | Status: AC
  Filled 2023-10-11: qty 1

## 2023-10-11 MED ORDER — SODIUM CHLORIDE 0.9 % IV SOLN: 5.0000 10*6.[IU] | Freq: Once | INTRAVENOUS | Status: DC

## 2023-10-11 MED ORDER — WITCH HAZEL-GLYCERIN EX PADS: 1.0000 | MEDICATED_PAD | CUTANEOUS | Status: DC | PRN

## 2023-10-11 MED ORDER — SIMETHICONE 80 MG PO CHEW
80.0000 mg | CHEWABLE_TABLET | Freq: Three times a day (TID) | ORAL | Status: DC
  Administered 2023-10-12 – 2023-10-14 (×7): 80 mg via ORAL
  Filled 2023-10-11 (×8): qty 1

## 2023-10-11 MED ORDER — SODIUM CHLORIDE 0.9 % IV SOLN
INTRAVENOUS | Status: AC
  Filled 2023-10-11: qty 5

## 2023-10-11 MED ORDER — DIPHENHYDRAMINE HCL 25 MG PO CAPS: 25.0000 mg | ORAL_CAPSULE | ORAL | Status: DC | PRN

## 2023-10-11 MED ORDER — MORPHINE SULFATE (PF) 0.5 MG/ML IJ SOLN
INTRAMUSCULAR | Status: DC | PRN
  Administered 2023-10-11: 3 mg via EPIDURAL

## 2023-10-11 MED ORDER — HYDROMORPHONE HCL 1 MG/ML IJ SOLN: 0.2500 mg | INTRAMUSCULAR | Status: DC | PRN

## 2023-10-11 MED ORDER — OXYTOCIN-SODIUM CHLORIDE 30-0.9 UT/500ML-% IV SOLN
1.0000 m[IU]/min | INTRAVENOUS | Status: DC
  Administered 2023-10-11: 2 m[IU]/min via INTRAVENOUS

## 2023-10-11 MED ORDER — OXYCODONE-ACETAMINOPHEN 5-325 MG PO TABS: 1.0000 | ORAL_TABLET | ORAL | Status: DC | PRN

## 2023-10-11 MED ORDER — SODIUM CHLORIDE 0.9% FLUSH: 3.0000 mL | INTRAVENOUS | Status: DC | PRN

## 2023-10-11 MED ORDER — OXYCODONE HCL 5 MG PO TABS: 5.0000 mg | ORAL_TABLET | Freq: Once | ORAL | Status: DC | PRN

## 2023-10-11 MED ORDER — COCONUT OIL OIL: 1.0000 | TOPICAL_OIL | Status: DC | PRN

## 2023-10-11 MED ORDER — KETOROLAC TROMETHAMINE 30 MG/ML IJ SOLN
30.0000 mg | Freq: Four times a day (QID) | INTRAMUSCULAR | Status: AC
  Administered 2023-10-12 (×4): 30 mg via INTRAVENOUS
  Filled 2023-10-11 (×4): qty 1

## 2023-10-11 MED ORDER — MENTHOL 3 MG MT LOZG: 1.0000 | LOZENGE | OROMUCOSAL | Status: DC | PRN

## 2023-10-11 MED ORDER — MEASLES, MUMPS & RUBELLA VAC IJ SOLR: 0.5000 mL | Freq: Once | INTRAMUSCULAR | Status: DC

## 2023-10-11 MED ORDER — MEDROXYPROGESTERONE ACETATE 150 MG/ML IM SUSP: 150.0000 mg | INTRAMUSCULAR | Status: DC | PRN

## 2023-10-11 MED ORDER — KETOROLAC TROMETHAMINE 30 MG/ML IJ SOLN: 30.0000 mg | Freq: Once | INTRAMUSCULAR | Status: DC | PRN

## 2023-10-11 MED ORDER — OXYTOCIN-SODIUM CHLORIDE 30-0.9 UT/500ML-% IV SOLN
INTRAVENOUS | Status: DC | PRN
  Administered 2023-10-11: 30 [IU] via INTRAVENOUS

## 2023-10-11 MED ORDER — DIPHENHYDRAMINE HCL 25 MG PO CAPS: 25.0000 mg | ORAL_CAPSULE | Freq: Four times a day (QID) | ORAL | Status: DC | PRN

## 2023-10-11 MED ORDER — PENICILLIN G POT IN DEXTROSE 60000 UNIT/ML IV SOLN: 3.0000 10*6.[IU] | INTRAVENOUS | Status: DC

## 2023-10-11 MED ORDER — KETOROLAC TROMETHAMINE 30 MG/ML IJ SOLN
INTRAMUSCULAR | Status: DC | PRN
  Administered 2023-10-11: 30 mg via INTRAVENOUS

## 2023-10-11 MED ORDER — OXYTOCIN-SODIUM CHLORIDE 30-0.9 UT/500ML-% IV SOLN
INTRAVENOUS | Status: AC
  Filled 2023-10-11: qty 500

## 2023-10-11 MED ORDER — LACTATED RINGERS IV SOLN: INTRAVENOUS | Status: DC | PRN

## 2023-10-11 MED ORDER — PHENYLEPHRINE HCL-NACL 20-0.9 MG/250ML-% IV SOLN
INTRAVENOUS | Status: DC | PRN
  Administered 2023-10-11: 60 ug/min via INTRAVENOUS

## 2023-10-11 MED ORDER — DIPHENHYDRAMINE HCL 50 MG/ML IJ SOLN: 12.5000 mg | INTRAMUSCULAR | Status: DC | PRN

## 2023-10-11 MED ORDER — CEFAZOLIN SODIUM-DEXTROSE 1-4 GM/50ML-% IV SOLN: 1.0000 g | Freq: Three times a day (TID) | INTRAVENOUS | Status: DC

## 2023-10-11 MED ORDER — SODIUM BICARBONATE 8.4 % IV SOLN
INTRAVENOUS | Status: DC | PRN
  Administered 2023-10-11: 12 mL via EPIDURAL

## 2023-10-11 MED ORDER — GABAPENTIN 100 MG PO CAPS
200.0000 mg | ORAL_CAPSULE | Freq: Every day | ORAL | Status: DC
  Administered 2023-10-12 – 2023-10-13 (×2): 200 mg via ORAL
  Filled 2023-10-11 (×2): qty 2

## 2023-10-11 MED ORDER — SODIUM CHLORIDE 0.9% IV SOLUTION: Freq: Once | INTRAVENOUS | Status: DC

## 2023-10-11 MED ORDER — SODIUM CHLORIDE 0.9 % IV SOLN
INTRAVENOUS | Status: DC | PRN
  Administered 2023-10-11: 500 mg via INTRAVENOUS

## 2023-10-11 MED ORDER — ONDANSETRON HCL 4 MG/2ML IJ SOLN
INTRAMUSCULAR | Status: AC
  Filled 2023-10-11: qty 2

## 2023-10-11 MED ORDER — SOD CITRATE-CITRIC ACID 500-334 MG/5ML PO SOLN: 30.0000 mL | ORAL | Status: DC | PRN

## 2023-10-11 MED ORDER — CEFAZOLIN SODIUM-DEXTROSE 2-3 GM-%(50ML) IV SOLR
INTRAVENOUS | Status: DC | PRN
  Administered 2023-10-11: 2 g via INTRAVENOUS

## 2023-10-11 MED ORDER — SODIUM CHLORIDE 0.9 % IV SOLN: INTRAVENOUS | Status: DC | PRN

## 2023-10-11 MED ORDER — SODIUM CHLORIDE 0.9 % IR SOLN
Status: DC | PRN
  Administered 2023-10-11: 1

## 2023-10-11 MED ORDER — LIDOCAINE HCL (PF) 1 % IJ SOLN: 30.0000 mL | INTRAMUSCULAR | Status: DC | PRN

## 2023-10-11 MED ORDER — SODIUM CHLORIDE 0.9% FLUSH
3.0000 mL | Freq: Two times a day (BID) | INTRAVENOUS | Status: DC
  Administered 2023-10-12: 3 mL via INTRAVENOUS
  Administered 2023-10-12 (×2): 10 mL via INTRAVENOUS
  Administered 2023-10-13: 3 mL via INTRAVENOUS

## 2023-10-11 MED ORDER — ACETAMINOPHEN 10 MG/ML IV SOLN
INTRAVENOUS | Status: DC | PRN
Start: 2023-10-11 — End: 2023-10-11
  Administered 2023-10-11: 1000 mg via INTRAVENOUS

## 2023-10-11 MED ORDER — TETANUS-DIPHTH-ACELL PERTUSSIS 5-2.5-18.5 LF-MCG/0.5 IM SUSY: 0.5000 mL | PREFILLED_SYRINGE | Freq: Once | INTRAMUSCULAR | Status: DC

## 2023-10-11 MED ORDER — LIDOCAINE-EPINEPHRINE (PF) 2 %-1:200000 IJ SOLN
INTRAMUSCULAR | Status: DC | PRN
  Administered 2023-10-11: 10 mg via EPIDURAL
  Administered 2023-10-11: 4 mg via EPIDURAL

## 2023-10-11 MED ORDER — LACTATED RINGERS IV SOLN: 500.0000 mL | INTRAVENOUS | Status: DC | PRN

## 2023-10-11 MED ORDER — ONDANSETRON HCL 4 MG/2ML IJ SOLN: 4.0000 mg | Freq: Four times a day (QID) | INTRAMUSCULAR | Status: DC | PRN

## 2023-10-11 MED ORDER — PHENYLEPHRINE 80 MCG/ML (10ML) SYRINGE FOR IV PUSH (FOR BLOOD PRESSURE SUPPORT)
PREFILLED_SYRINGE | INTRAVENOUS | Status: AC
  Filled 2023-10-11: qty 10

## 2023-10-11 MED ORDER — OXYTOCIN-SODIUM CHLORIDE 30-0.9 UT/500ML-% IV SOLN: 2.5000 [IU]/h | INTRAVENOUS | Status: AC

## 2023-10-11 MED ORDER — SIMETHICONE 80 MG PO CHEW: 80.0000 mg | CHEWABLE_TABLET | ORAL | Status: DC | PRN

## 2023-10-11 MED ORDER — SENNOSIDES-DOCUSATE SODIUM 8.6-50 MG PO TABS
2.0000 | ORAL_TABLET | Freq: Every day | ORAL | Status: DC
  Administered 2023-10-12 – 2023-10-14 (×2): 2 via ORAL
  Filled 2023-10-11 (×3): qty 2

## 2023-10-11 MED ORDER — LACTATED RINGERS IV SOLN
500.0000 mL | Freq: Once | INTRAVENOUS | Status: AC
  Administered 2023-10-11: 500 mL via INTRAVENOUS

## 2023-10-11 MED ORDER — ACETAMINOPHEN 500 MG PO TABS
1000.0000 mg | ORAL_TABLET | Freq: Four times a day (QID) | ORAL | Status: DC
  Administered 2023-10-12 – 2023-10-14 (×10): 1000 mg via ORAL
  Filled 2023-10-11 (×13): qty 2

## 2023-10-11 MED ORDER — FENTANYL CITRATE (PF) 100 MCG/2ML IJ SOLN
INTRAMUSCULAR | Status: DC | PRN
  Administered 2023-10-11: 100 ug via EPIDURAL

## 2023-10-11 MED ORDER — PHENYLEPHRINE HCL (PRESSORS) 10 MG/ML IV SOLN
INTRAVENOUS | Status: DC | PRN
  Administered 2023-10-11 (×4): 80 ug via INTRAVENOUS

## 2023-10-11 MED ORDER — MORPHINE SULFATE (PF) 0.5 MG/ML IJ SOLN
INTRAMUSCULAR | Status: AC
  Filled 2023-10-11: qty 10

## 2023-10-11 MED ORDER — ALBUMIN HUMAN 5 % IV SOLN: INTRAVENOUS | Status: DC | PRN

## 2023-10-11 MED ORDER — DEXAMETHASONE SODIUM PHOSPHATE 10 MG/ML IJ SOLN
INTRAMUSCULAR | Status: AC
  Filled 2023-10-11: qty 1

## 2023-10-11 MED ORDER — PRENATAL MULTIVITAMIN CH
1.0000 | ORAL_TABLET | Freq: Every day | ORAL | Status: DC
  Administered 2023-10-12 – 2023-10-14 (×3): 1 via ORAL
  Filled 2023-10-11 (×3): qty 1

## 2023-10-11 SURGICAL SUPPLY — 33 items
BENZOIN TINCTURE PRP APPL 2/3 (GAUZE/BANDAGES/DRESSINGS) ×2 IMPLANT
CHLORAPREP W/TINT 26 (MISCELLANEOUS) ×4 IMPLANT
CLAMP UMBILICAL CORD (MISCELLANEOUS) ×2 IMPLANT
CLOTH BEACON ORANGE TIMEOUT ST (SAFETY) ×2 IMPLANT
DERMABOND ADVANCED .7 DNX12 (GAUZE/BANDAGES/DRESSINGS) ×2 IMPLANT
DRSG OPSITE POSTOP 4X10 (GAUZE/BANDAGES/DRESSINGS) ×2 IMPLANT
ELECT REM PT RETURN 9FT ADLT (ELECTROSURGICAL) ×1 IMPLANT
ELECTRODE REM PT RTRN 9FT ADLT (ELECTROSURGICAL) ×2 IMPLANT
EXTRACTOR VACUUM KIWI (MISCELLANEOUS) IMPLANT
GLOVE BIOGEL PI IND STRL 7.0 (GLOVE) ×6 IMPLANT
GLOVE ECLIPSE 6.5 STRL STRAW (GLOVE) ×2 IMPLANT
GOWN STRL REUS W/TWL LRG LVL3 (GOWN DISPOSABLE) ×6 IMPLANT
HEMOSTAT ARISTA ABSORB 3G PWDR (HEMOSTASIS) IMPLANT
KIT ABG SYR 3ML LUER SLIP (SYRINGE) IMPLANT
NDL HYPO 25X5/8 SAFETYGLIDE (NEEDLE) IMPLANT
NEEDLE HYPO 25X5/8 SAFETYGLIDE (NEEDLE) IMPLANT
NS IRRIG 1000ML POUR BTL (IV SOLUTION) ×2 IMPLANT
PACK C SECTION WH (CUSTOM PROCEDURE TRAY) ×2 IMPLANT
PAD ABD 7.5X8 STRL (GAUZE/BANDAGES/DRESSINGS) ×2 IMPLANT
PAD OB MATERNITY 4.3X12.25 (PERSONAL CARE ITEMS) ×2 IMPLANT
RTRCTR C-SECT PINK 25CM LRG (MISCELLANEOUS) ×2 IMPLANT
STRIP CLOSURE SKIN 1/2X4 (GAUZE/BANDAGES/DRESSINGS) IMPLANT
SUT PLAIN 0 NONE (SUTURE) IMPLANT
SUT PLAIN 2 0 XLH (SUTURE) IMPLANT
SUT VIC AB 0 CT1 27XBRD ANBCTR (SUTURE) ×4 IMPLANT
SUT VIC AB 0 CTX36XBRD ANBCTRL (SUTURE) ×6 IMPLANT
SUT VIC AB 2-0 CT1 TAPERPNT 27 (SUTURE) ×2 IMPLANT
SUT VIC AB 3-0 SH 27XBRD (SUTURE) ×2 IMPLANT
SUT VIC AB 4-0 KS 27 (SUTURE) ×2 IMPLANT
SUTURE PLAIN GUT 2.0 ETHICON (SUTURE) IMPLANT
TOWEL OR 17X24 6PK STRL BLUE (TOWEL DISPOSABLE) ×2 IMPLANT
TRAY FOLEY W/BAG SLVR 14FR LF (SET/KITS/TRAYS/PACK) IMPLANT
WATER STERILE IRR 1000ML POUR (IV SOLUTION) ×2 IMPLANT

## 2023-10-11 NOTE — Discharge Summary (Signed)
 Postpartum Discharge Summary       Patient Name: Maria Brooks DOB: 1991-02-23 MRN: [id number]  Date of admission: 10/11/2023 Delivery date:10/11/2023 Delivering provider: Myna Hidalgo Date of discharge: 10/14/2023  Admitting diagnosis: Encounter for induction of labor [Z34.90] Intrauterine pregnancy [Z34.90] Intrauterine pregnancy: [redacted]w[redacted]d     Secondary diagnosis:  Principal Problem:   Status post repeat low transverse cesarean section Active Problems:   Encounter for induction of labor   Intrauterine pregnancy  Additional problems: morbid obesity    Discharge diagnosis: Term Pregnancy Delivered, Anemia, and PPH                                              Post partum procedures:blood transfusion Augmentation: AROM, Pitocin, and IP Foley Complications: None  Hospital course: Induction of Labor With Vaginal Delivery   33 y.o. yo G3P3003 at [redacted]w[redacted]d was admitted to the hospital 10/11/2023 for induction of labor.  Indication for induction:  obesity .  Patient had an labor course complicated by recurrent decelerations at second stage of labor, vacuum attempted and unsuccessful, resulting in stat cesarean delivery due to non-reassuring fetal status. Membrane Rupture Time/Date: 12:30 PM,10/11/2023  Delivery Method:C-Section, Low Transverse Operative Delivery:N/A Episiotomy: None Lacerations:  None Details of delivery can be found in separate delivery note.  Patient had a postpartum course complicated by nothing. Patient is discharged home 10/14/23.  Newborn Data: Birth date:10/11/2023 Birth time:6:40 PM Gender:Female Living status:Living Apgars:8 ,8  Weight:3820 g  Magnesium Sulfate received: No BMZ received: No Rhophylac:N/A MMR:N/A T-DaP:Given prenatally Flu: No RSV Vaccine received: No Transfusion:No  Immunizations received: Immunization History  Administered Date(s) Administered   HPV 9-valent 09/27/2014, 12/08/2014   Influenza,inj,Quad PF,6+ Mos 04/30/2018,  05/01/2020   Tdap 08/18/2018, 02/03/2020, 08/04/2023    Physical exam  Vitals:   10/13/23 0521 10/13/23 1414 10/13/23 2049 10/14/23 0634  BP: 104/67 113/64 116/71 113/69  Pulse: 82 68 80   Resp: 18 18 18 19   Temp: 98 F (36.7 C) 98.3 F (36.8 C) 98.3 F (36.8 C) 98.3 F (36.8 C)  TempSrc: Oral Oral Oral Oral  SpO2: 100%  98%   Weight:      Height:       General: alert, cooperative, and no distress Lochia: appropriate Uterine Fundus: firm Incision: Healing well with no significant drainage, No significant erythema, Dressing is clean, dry, and intact DVT Evaluation: No evidence of DVT seen on physical exam. Negative Homan's sign. No cords or calf tenderness. No significant calf/ankle edema. Labs: Lab Results  Component Value Date   WBC 15.1 (H) 10/12/2023   HGB 9.0 (L) 10/12/2023   HCT 28.0 (L) 10/12/2023   MCV 79.8 (L) 10/12/2023   PLT 167 10/12/2023      Latest Ref Rng & Units 10/11/2023    8:41 AM  CMP  Glucose 70 - 99 mg/dL 77   BUN 6 - 20 mg/dL 8   Creatinine 4.09 - 8.11 mg/dL 9.14   Sodium 782 - 956 mmol/L 135   Potassium 3.5 - 5.1 mmol/L 4.2   Chloride 98 - 111 mmol/L 104   CO2 22 - 32 mmol/L 19   Calcium 8.9 - 10.3 mg/dL 9.0   Total Protein 6.5 - 8.1 g/dL 5.7   Total Bilirubin 0.0 - 1.2 mg/dL 0.5   Alkaline Phos 38 - 126 U/L 111   AST 15 -  41 U/L 36   ALT 0 - 44 U/L 30    Edinburgh Score:    10/12/2023    6:52 PM  Edinburgh Postnatal Depression Scale Screening Tool  I have been able to laugh and see the funny side of things. 0  I have looked forward with enjoyment to things. 0  I have blamed myself unnecessarily when things went wrong. 0  I have been anxious or worried for no good reason. 0  I have felt scared or panicky for no good reason. 0  Things have been getting on top of me. 0  I have been so unhappy that I have had difficulty sleeping. 0  I have felt sad or miserable. 0  I have been so unhappy that I have been crying. 0  The thought of  harming myself has occurred to me. 0  Edinburgh Postnatal Depression Scale Total 0   No data recorded  After visit meds:  Allergies as of 10/14/2023       Reactions   Penicillins Other (See Comments)   Family history of allergic reaction        Medication List     STOP taking these medications    ondansetron 4 MG tablet Commonly known as: Zofran       TAKE these medications    acetaminophen 500 MG tablet Commonly known as: TYLENOL Take 2 tablets (1,000 mg total) by mouth every 6 (six) hours.   enoxaparin 60 MG/0.6ML injection Commonly known as: LOVENOX Inject 0.55 mLs (55 mg total) into the skin daily. What changed:  medication strength how much to take   ferrous sulfate 325 (65 FE) MG tablet Take 1 tablet (325 mg total) by mouth every other day.   ibuprofen 600 MG tablet Commonly known as: ADVIL Take 1 tablet (600 mg total) by mouth every 6 (six) hours.   oxyCODONE 5 MG immediate release tablet Commonly known as: Oxy IR/ROXICODONE Take 1-2 tablets (5-10 mg total) by mouth every 4 (four) hours as needed for moderate pain (pain score 4-6).   prenatal vitamin w/FE, FA 27-1 MG Tabs tablet Take 1 tablet by mouth daily at 12 noon.         Discharge home in stable condition Infant Feeding: Breast Infant Disposition:home with mother Discharge instruction: per After Visit Summary and Postpartum booklet. Activity: Advance as tolerated. Pelvic rest for 6 weeks.  Diet: routine diet Future Appointments: Future Appointments  Date Time Provider Department Center  10/17/2023  9:50 AM Sue Lush, FNP [facility]  11/13/2023 10:30 AM Cresenzo-Dishmon, Scarlette Calico, CNM [facility]   Follow up Visit: Message sent to [facility] 3/29  Please schedule this patient for a In person postpartum visit in 6 weeks with the following provider: Any provider. Additional Postpartum F/U:Incision check 1 week  High risk pregnancy complicated by:  obesity, history of central venous  sinus thrombosis on Lovenox Delivery mode:  C-Section, Low Transverse Anticipated Birth Control:   Natural family planning   10/14/2023 Jacklyn Shell, CNM

## 2023-10-11 NOTE — Transfer of Care (Signed)
 Immediate Anesthesia Transfer of Care Note  Patient: Maria Brooks  Procedure(s) Performed: CESAREAN DELIVERY  Patient Location: PACU  Anesthesia Type:Epidural  Level of Consciousness: awake, alert , and oriented  Airway & Oxygen Therapy: Patient Spontanous Breathing  Post-op Assessment: Report given to RN and Post -op Vital signs reviewed and stable  Post vital signs: Reviewed and stable  Last Vitals:  Vitals Value Taken Time  BP 105/70 10/11/23 2000  Temp    Pulse 93 10/11/23 2006  Resp 15 10/11/23 2006  SpO2 100 % 10/11/23 2006  Vitals shown include unfiled device data.  Last Pain:  Vitals:   10/11/23 1800  TempSrc:   PainSc: 0-No pain         Complications: No notable events documented.

## 2023-10-11 NOTE — Anesthesia Preprocedure Evaluation (Addendum)
 Anesthesia Evaluation  Patient identified by MRN, date of birth, ID band Patient awake    Reviewed: Allergy & Precautions, NPO status , Patient's Chart, lab work & pertinent test results  Airway Mallampati: III  TM Distance: >3 FB Neck ROM: Full    Dental no notable dental hx. (+) Teeth Intact, Dental Advisory Given   Pulmonary neg pulmonary ROS   Pulmonary exam normal breath sounds clear to auscultation       Cardiovascular negative cardio ROS Normal cardiovascular exam Rhythm:Regular Rate:Normal     Neuro/Psych Dural venous sinus thrombosis.  Off lovenox 10/09/2023 negative neurological ROS  negative psych ROS   GI/Hepatic Neg liver ROS,,,  Endo/Other  negative endocrine ROS    Renal/GU negative Renal ROS     Musculoskeletal   Abdominal   Peds  Hematology   Anesthesia Other Findings All: PCNs  Reproductive/Obstetrics (+) Pregnancy                             Anesthesia Physical Anesthesia Plan  ASA: 3  Anesthesia Plan: Epidural   Post-op Pain Management:    Induction:   PONV Risk Score and Plan:   Airway Management Planned:   Additional Equipment:   Intra-op Plan:   Post-operative Plan:   Informed Consent: I have reviewed the patients History and Physical, chart, labs and discussed the procedure including the risks, benefits and alternatives for the proposed anesthesia with the patient or authorized representative who has indicated his/her understanding and acceptance.       Plan Discussed with: CRNA  Anesthesia Plan Comments: (40.5 Wk G3P2 BMI 43 for TOLAc w LEA)       Anesthesia Quick Evaluation

## 2023-10-11 NOTE — Lactation Note (Signed)
 This note was copied from a baby's chart. Lactation Consultation Note  Patient Name: Maria Brooks [id number] Date: 10/11/2023 Age:33 hours Reason for consult: Initial assessment  P3- Per RN, MOB declines all LC services while in the hospital.  Feeding Mother's Current Feeding Choice: Breast Milk  Consult Status Consult Status: Complete (mother declined follow up) Date: 10/11/23    Dema Severin BS, IBCLC 10/11/2023, 10:52 PM

## 2023-10-11 NOTE — Progress Notes (Signed)
 Labor Progress Note Maria Brooks is a 33 y.o. G3P2002 at [redacted]w[redacted]d presented for IOL due to morbid obesity. Prior CS d/t abruption, attempting TOLAC.  S: Patient is resting comfortably.  O:  BP 125/77   Pulse (!) 125   Temp 98.7 F (37.1 C) (Oral)   Resp 16   Ht 5\' 2"  (1.575 m)   Wt 107 kg   LMP 12/30/2022   SpO2 97%   BMI 43.13 kg/m  EFM: indeterminate baseline w/ pushes / marked variability / variable and prolonged decels  CVE: Dilation: Lip/rim Dilation Complete Date: 10/11/23 Dilation Complete Time: 1411 Effacement (%): 90 Cervical Position: Posterior Station: Plus 2 Presentation: Vertex Exam by:: Dr. Charlotta Newton   [name]: 33 y.o. [id number] [redacted]w[redacted]d for IOL due to morbid obesity. Prior CS d/t abruption, attempting TOLAC #Labor: Pit and AROM, patient progressed to complete. Patient well epiduralized and not feeling much pressure to push effectively. Had some variable/prolonged decels without pushing and positional changes were attempted. When pushing was attempted again she was able to have minimal effectiveness, however baby's HR was not tolerating well. Dr. Charlotta Newton was called to the room to attempt assisted vaginal delivery with vacuum after mom and baby were allowed some time to recuperate. This was attempted for about 3 contractions. With continued prolonged decelerations in baby's HR, STAT CS was called and patient was promptly taken to the OR.  #Pain: Epidural #FWB: Cat 3 #GBS negative #Cerebral venous sinus thrombosis: on Lovenex. Last taken Thursday 03/27  [name], DO [facility] [employer], [facility] 6:42 PM

## 2023-10-11 NOTE — Progress Notes (Signed)
 Labor Progress Note Maria Brooks is a 33 y.o. G3P2002 at [redacted]w[redacted]d presented for IOL for obesity Prior vaginal delivery followed by CS for FITL. Patient desires TOLAC  S:  Comfortable s/p epidural.   O:  BP 125/77   Pulse (!) 125   Temp 98.7 F (37.1 C) (Oral)   Resp 16   Ht 5\' 2"  (1.575 m)   Wt 107 kg   LMP 12/30/2022   SpO2 97%   BMI 43.13 kg/m   EFM: baseline 155 bpm/ moderate variability/ 15x15 accels/ variable decels  Toco/IUPC: 2  SVE: Dilation: Lip/rim Dilation Complete Date: 10/11/23 Dilation Complete Time: 1411 Effacement (%): 90 Cervical Position: Posterior Station: -1 Presentation: Vertex Exam by:: Verdie Drown, RN Pitocin: 0 mu/min  A/P: 33 y.o. G3P2002 [redacted]w[redacted]d IOL   1. Labor: IOL now in active phase of labor. Laboring down approximately 1 hour or until maternal urge to push if sooner.  2. FWB: Cat 2, recurrent deep variables, however with good variabliity. 3. Pain: Comfortable s/p epidural  4. GBS neg  Anticipate NVSB.  Richardson Landry, CNM 5:33 PM

## 2023-10-11 NOTE — Op Note (Signed)
 PreOp Diagnosis: 1) Intrauterine pregnancy @ [redacted]w[redacted]d 2) TOLAC, Failed vacuum 3) arrest of descent, non-reassuring fetal well being  PostOp Diagnosis: same Procedure:  Repeat C-section Surgeon: Dr. Charlotta Newton Assistant: Dr. Shea Evans and Dr. Judd Lien Anesthesia: epidural Complications: none EBL: ~1600cc UOP: *** Fluids: ***  INDICATIONS: ***  Findings: ***  PROCEDURE:  Informed consent was obtained from the patient with risks, benefits, complications, treatment options, and expected outcomes discussed with the patient.  The patient concurred with the proposed plan, giving informed consent with form signed.   The patient was taken to Operating Room, and identified with the procedure verified as C-Section Delivery with Time Out. With induction of anesthesia, the patient was prepped and draped in the usual sterile fashion. A Pfannenstiel incision was made and carried down through the subcutaneous tissue to the fascia. The fascia was incised in the midline and extended transversely. The superior aspect of the fascial incision was grasped with Kochers elevated and the underlying muscle dissected off. The inferior aspect of the facial incision was in similar fashion, grasped elevated and rectus muscles dissected off. The peritoneum was identified and entered. Peritoneal incision was extended longitudinally. Alexis retractor was placed.  *** The utero-vesical peritoneal reflection was identified and incised transversely with the [facility] scissors, the incision extended laterally, the bladder flap created digitally. A low transverse uterine incision was made and the infants head delivered atraumatically. After the umbilical cord was clamped and cut cord blood was obtained for evaluation.   The placenta was removed intact and appeared normal. The uterine outline, tubes and ovaries appeared normal. The uterine incision was closed with running locked sutures of 0 monocryl *** and a second layer of the same stitch was used in  an imbricating fashion.  Excellent hemostasis was obtained. Alexis retractor was removed.  *** The fascia was then reapproximated with running sutures of 0 Vicryl. The subcutaneous tissue was reapproximated with 2-0 plain gut suture.  *** The skin was closed with 4-0 vicryl in a subcuticular fashion.  Instrument, sponge, and needle counts were correct prior the abdominal closure and at the conclusion of the case. The patient was taken to recovery in stable condition.

## 2023-10-11 NOTE — Progress Notes (Signed)
 Labor Progress Note Maria Brooks is a 33 y.o. G3P2002 at [redacted]w[redacted]d presented for for IOL due to morbid obesity. Prior CS d/t abruption, attempting TOLAC.  S: Patient is resting comfortably.   O:  BP 114/72   Pulse (!) 126   Temp 98.1 F (36.7 C) (Oral)   Resp 16   Ht 5\' 2"  (1.575 m)   Wt 107 kg   LMP 12/30/2022   SpO2 96%   BMI 43.13 kg/m  EFM: baseline 150 bpm / moderate variability / +accelerations  CVE: Dilation: 10 Dilation Complete Date: 10/11/23 Dilation Complete Time: 1411 Effacement (%): 70 Cervical Position: Posterior Station: -2 Presentation: Vertex Exam by:: Leyland Kenna   [name]: 33 y.o. [id number] [redacted]w[redacted]d  presented for IOL d/t morbid obesity. Prior CS, attempting TOLAC.  #Labor: Progressing to complete with Pit and AROM. 10/90/-1. Patient well epiduralized and not feeling much pressure to push effectively, will allow time for baby to recuperate and labor down. 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 Last taken Thursday 03/27  [name], DO [facility], PGY-1 2:50 PM

## 2023-10-11 NOTE — Progress Notes (Signed)
 Labor Progress Note Maria Brooks is a 33 y.o. G3P2002 at [redacted]w[redacted]d presented for IOL due to morbid obesity. Prior CS d/t abruption, attempting TOLAC.   S: Patient is resting comfortably s/p epidural. Amenable to AROM.  O:  BP 117/67   Pulse 99   Temp 97.9 F (36.6 C) (Oral)   Resp 18   Ht 5\' 2"  (1.575 m)   Wt 107 kg   LMP 12/30/2022   SpO2 93%   BMI 43.13 kg/m  EFM: baseline 140 bpm / moderate variability / + accelerations  CVE: Dilation: 6 Effacement (%): 70 Cervical Position: Posterior Station: -2 Presentation: Vertex Exam by:: Thong Feeny   [name]: 33 y.o. [id number] [redacted]w[redacted]d presented for IOL d/t morbid obesity. Prior CS, attempting TOLAC.  #Labor: Progressing well with Pit. AROM performed, tolerated well.  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 Last taken Thursday 03/27  [name], DO 12:45 PM

## 2023-10-11 NOTE — Anesthesia Procedure Notes (Signed)
 Epidural Patient location during procedure: OB Start time: 10/11/2023 11:41 AM End time: 10/11/2023 12:01 PM  Staffing Anesthesiologist: Trevor Iha, MD Performed: anesthesiologist   Preanesthetic Checklist Completed: patient identified, IV checked, site marked, risks and benefits discussed, surgical consent, monitors and equipment checked, pre-op evaluation and timeout performed  Epidural Patient position: sitting Prep: DuraPrep and site prepped and draped Patient monitoring: continuous pulse ox and blood pressure Approach: midline Location: L3-L4 Injection technique: LOR air  Needle:  Needle type: Tuohy  Needle gauge: 17 G Needle length: 9 cm and 9 Needle insertion depth: 8 cm Catheter type: closed end flexible Catheter size: 19 Gauge Catheter at skin depth: 15 cm Test dose: negative  Assessment Events: blood not aspirated, no cerebrospinal fluid, injection not painful, no injection resistance, no paresthesia and negative IV test  Additional Notes Patient identified. Risks/Benefits/Options discussed with patient including but not limited to bleeding, infection, nerve damage, paralysis, failed block, incomplete pain control, headache, blood pressure changes, nausea, vomiting, reactions to medication both or allergic, itching and postpartum back pain. Confirmed with bedside nurse the patient's most recent platelet count. Confirmed with patient that they are not currently taking any anticoagulation, have any bleeding history or any family history of bleeding disorders. Patient expressed understanding and wished to proceed. All questions were answered. Sterile technique was used throughout the entire procedure. Please see nursing notes for vital signs. Test dose was given through epidural needle and negative prior to continuing to dose epidural or start infusion. Warning signs of high block given to the patient including shortness of breath, tingling/numbness in hands, complete  motor block, or any concerning symptoms with instructions to call for help. Patient was given instructions on fall risk and not to get out of bed. All questions and concerns addressed with instructions to call with any issues.  2 Attempt (S) . Patient tolerated procedure well.

## 2023-10-11 NOTE — Progress Notes (Signed)
 Labor Progress Note Maria Brooks is a 33 y.o. G3P2002 at [redacted]w[redacted]d presented for IOL for obesity history significant for provoked CVS thrombosis.   S:  Coping well in latent phase of labor.   O:  BP 114/76   Pulse (!) 104   Temp 98.4 F (36.9 C) (Oral)   Resp 16   Ht 5\' 2"  (1.575 m)   Wt 107 kg   LMP 12/30/2022   BMI 43.13 kg/m   EFM: baseline 150 bpm/ mod variability/ 15x15 accels/ absent decels  Toco/IUPC: 2-4 SVE: Dilation: 2 Effacement (%): 70 Cervical Position: Posterior Station: -3 Presentation: Vertex Exam by:: Lamont Snowball, CNM Pitocin: 4 mu/min  A/P: 33 y.o. [id number] [redacted]w[redacted]d IOL for obesity at postdates, Foley balloon   1. Labor: IOL in latent phase of labor. RBA of FB d/w with patient, with patient agreeing to and desiring FB with shared decision making. Placed without difficulty, patient tolerated well. Patient agreeable to AROM once FB expelled. 2. FWB: Cat 1 3. Pain: Coping well, epidural on request.  4. GBS neg   Anticipate NVSB.  Richardson Landry, CNM 10:36 AM

## 2023-10-12 ENCOUNTER — Other Ambulatory Visit: Payer: Self-pay

## 2023-10-12 LAB — CBC
HCT: 28 % — ABNORMAL LOW (ref 36.0–46.0)
Hemoglobin: 9 g/dL — ABNORMAL LOW (ref 12.0–15.0)
MCH: 25.6 pg — ABNORMAL LOW (ref 26.0–34.0)
MCHC: 32.1 g/dL (ref 30.0–36.0)
MCV: 79.8 fL — ABNORMAL LOW (ref 80.0–100.0)
Platelets: 167 10*3/uL (ref 150–400)
RBC: 3.51 MIL/uL — ABNORMAL LOW (ref 3.87–5.11)
RDW: 17.2 % — ABNORMAL HIGH (ref 11.5–15.5)
WBC: 15.1 10*3/uL — ABNORMAL HIGH (ref 4.0–10.5)
nRBC: 0 % (ref 0.0–0.2)

## 2023-10-12 NOTE — Anesthesia Postprocedure Evaluation (Signed)
 Anesthesia Post Note  Patient: Maria Brooks  Procedure(s) Performed: CESAREAN DELIVERY     Patient location during evaluation: Mother Baby Anesthesia Type: Epidural Level of consciousness: oriented and awake and alert Pain management: pain level controlled Vital Signs Assessment: post-procedure vital signs reviewed and stable Respiratory status: spontaneous breathing and respiratory function stable Cardiovascular status: blood pressure returned to baseline and stable Postop Assessment: no headache, no backache, no apparent nausea or vomiting and able to ambulate Anesthetic complications: no  No notable events documented.  Last Vitals:  Vitals:   10/12/23 1012 10/12/23 1500  BP: 101/68 92/60  Pulse: 92 91  Resp:    Temp: 37 C 36.9 C  SpO2: 96% 95%    Last Pain:  Vitals:   10/12/23 1502  TempSrc:   PainSc: 2                  Trevor Iha

## 2023-10-12 NOTE — Progress Notes (Signed)
 POSTPARTUM PROGRESS NOTE  Subjective: Maria Brooks is a 33 y.o. [phone] s/p stat LTCS at [redacted]w[redacted]d s/p failed TOLAC and vacuum. She reports she doing well. No acute events overnight. She denies any problems with ambulating w/ assistance, voiding or PO intake. Denies nausea or vomiting. She has not passed flatus. Pain is well controlled. Lochia is minimal.  Objective: Blood pressure 115/86, pulse 86, temperature 98.6 F (37 C), temperature source Oral, resp. rate 18, height 5\' 2"  (1.575 m), weight 107 kg, last menstrual period 12/30/2022, SpO2 96%, unknown if currently breastfeeding.  Physical Exam:  General: alert, cooperative and no distress Chest: no respiratory distress Abdomen: soft, non-tender, incision dressing C/D/I Uterine Fundus: firm and at level of umbilicus Extremities: No calf swelling or tenderness  1+ edema  Recent Labs    10/11/23 2014 10/12/23 0313  HGB 10.5* 9.0*  HCT 33.2* 28.0*    Assessment/Plan: Maria Brooks is a 33 y.o. G3P3003 s/p stat LTCS at [redacted]w[redacted]d for failed TOLAC and vacuum. Patient is doing well this morning with no concerns.   Routine Postpartum Care: Doing well, pain well-controlled.  - Continue routine care, lactation support  - Contraception: NFP - Feeding: BF - Cerebral venous sinus thrombosis: on Lovenox. Last taken Thursday 03/27, will restart today at 1200. Currently has SCDs on.   Dispo: Plan for discharge 1-2 days.  [name], DO [facility], PGY-1 10/12/2023 8:34 AM    Attestation of Supervision of Resident:  I confirm that I have verified the information documented in the resident's note and that I have also personally reperformed the history, physical exam and all medical decision making activities.  I have verified that all services and findings are accurately documented in this resident's note; and I agree with management and plan as outlined in the documentation. I have also made any necessary editorial changes.    Sundra Aland, MD OB Fellow, Faculty Practice [facility], Center for [facility]

## 2023-10-13 ENCOUNTER — Encounter (HOSPITAL_COMMUNITY): Payer: Self-pay | Admitting: Obstetrics & Gynecology

## 2023-10-13 NOTE — Progress Notes (Signed)
 POSTPARTUM PROGRESS NOTE  Subjective: Maria Brooks is a 33 y.o. G3P3003 s/p LTCS at [redacted]w[redacted]d.  She reports she doing well. No acute events overnight. She denies any problems with ambulating, voiding or po intake. Denies nausea or vomiting. She has passed flatus. Pain is well controlled.  Lochia is appropriate.  Objective: Blood pressure 104/67, pulse 82, temperature 98 F (36.7 C), temperature source Oral, resp. rate 18, height 5\' 2"  (1.575 m), weight 107 kg, last menstrual period 12/30/2022, SpO2 100%, formula feeding.  Physical Exam:  General: alert, cooperative and no distress Chest: no respiratory distress Abdomen: soft, non-tender  Uterine Fundus: firm and at level of umbilicus Extremities: No calf swelling or tenderness  no edema  Recent Labs    10/11/23 2014 10/12/23 0313  HGB 10.5* 9.0*  HCT 33.2* 28.0*    Assessment/Plan: Maria Brooks is a 33 y.o. G3P3003 s/p LTCS at [redacted]w[redacted]d for failed vacuum. PPH EBL about 1600.   Routine Postpartum Care: Doing well, pain well-controlled.  -- Continue routine care, lactation support  -- Contraception: NFP -- Feeding: formula --PPH: Hgb 10.5--> 9.0. Pt asymptomatic and denies fatigue, dizziness.  Dispo: Plan for discharge tomorrow.  Denton Ar, MD Faculty Practice, Center for [facility] 10/13/2023 8:39 AM

## 2023-10-14 LAB — SURGICAL PATHOLOGY

## 2023-10-14 MED ORDER — ACETAMINOPHEN 500 MG PO TABS
1000.0000 mg | ORAL_TABLET | Freq: Four times a day (QID) | ORAL | 0 refills | Status: DC
Start: 2023-10-14 — End: 2023-11-20

## 2023-10-14 MED ORDER — ENOXAPARIN SODIUM 60 MG/0.6ML IJ SOSY: 55.0000 mg | PREFILLED_SYRINGE | INTRAMUSCULAR | 0 refills | Status: DC

## 2023-10-14 MED ORDER — IBUPROFEN 600 MG PO TABS: 600.0000 mg | ORAL_TABLET | Freq: Four times a day (QID) | ORAL | 0 refills | Status: DC

## 2023-10-14 MED ORDER — FERROUS SULFATE 325 (65 FE) MG PO TABS: 325.0000 mg | ORAL_TABLET | ORAL | 2 refills | Status: DC

## 2023-10-14 MED ORDER — OXYCODONE HCL 5 MG PO TABS
5.0000 mg | ORAL_TABLET | ORAL | 0 refills | Status: DC | PRN
Start: 2023-10-14 — End: 2023-11-20

## 2023-10-15 LAB — BPAM RBC
Blood Product Expiration Date: 202504272359
Blood Product Expiration Date: 202504292359
Blood Product Expiration Date: 202504292359
Blood Product Expiration Date: 202504292359
ISSUE DATE / TIME: 202503291856
ISSUE DATE / TIME: 202503291856
Unit Type and Rh: 6200
Unit Type and Rh: 6200
Unit Type and Rh: 6200
Unit Type and Rh: 6200

## 2023-10-15 LAB — TYPE AND SCREEN
ABO/RH(D): AB POS
Antibody Screen: NEGATIVE
Unit division: 0
Unit division: 0
Unit division: 0
Unit division: 0

## 2023-10-17 ENCOUNTER — Encounter: Payer: Self-pay | Admitting: Obstetrics & Gynecology

## 2023-10-17 ENCOUNTER — Ambulatory Visit (INDEPENDENT_AMBULATORY_CARE_PROVIDER_SITE_OTHER): Admitting: Obstetrics & Gynecology

## 2023-10-17 VITALS — BP 125/86 | HR 85 | Ht 62.0 in | Wt 220.2 lb

## 2023-10-17 DIAGNOSIS — Z4889 Encounter for other specified surgical aftercare: Secondary | ICD-10-CM

## 2023-10-17 NOTE — Progress Notes (Signed)
    PostOp Visit Note  Maria Brooks is a 33 y.o. G75P3003 female who presents for a postoperative visit. She is 1 week postop following a repeat C-section completed on 3/29   Denies fever or chills.  Tolerating gen diet.  +Flatus, Regular BMs.  Pain is well controlled Overall doing well and reports no acute complaints   Review of Systems Pertinent items are noted in HPI.    Objective:  BP 125/86 (BP Location: Right Arm, Patient Position: Sitting, Cuff Size: Normal)   Pulse 85   Ht 5\' 2"  (1.575 m)   Wt 220 lb 3.2 oz (99.9 kg)   LMP 12/30/2022   Breastfeeding Yes   BMI 40.28 kg/m    Physical Examination:  GENERAL ASSESSMENT: well developed and well nourished SKIN: warm and dry CHEST: normal air exchange, respiratory effort normal with no retractions HEART: regular rate and rhythm ABDOMEN: soft, non-distended INCISION: honeycomb removed.  Incision well healed- C/D/I EXTREMITY: no edema, no calf tenderness bilaterally PSYCH: mood appropriate, normal affect       Assessment:    Postop   Plan:   -meeting milestones appropriately -continue routine postpartum care  Myna Hidalgo, DO Attending Obstetrician & Gynecologist, Faculty Practice Center for [employer], [facility]

## 2023-11-11 ENCOUNTER — Other Ambulatory Visit: Payer: Self-pay | Admitting: Women's Health

## 2023-11-11 ENCOUNTER — Encounter: Payer: Self-pay | Admitting: Advanced Practice Midwife

## 2023-11-11 MED ORDER — CEPHALEXIN 500 MG PO CAPS: 500.0000 mg | ORAL_CAPSULE | Freq: Four times a day (QID) | ORAL | 0 refills | Status: DC

## 2023-11-13 ENCOUNTER — Ambulatory Visit: Admitting: Advanced Practice Midwife

## 2023-11-20 ENCOUNTER — Ambulatory Visit: Admitting: Advanced Practice Midwife

## 2023-11-20 ENCOUNTER — Encounter: Payer: Self-pay | Admitting: Advanced Practice Midwife

## 2023-11-20 NOTE — Progress Notes (Signed)
 Post Partum Visit Note   Chief Complaint:   Postpartum Care  History of Present Illness:   Maria Brooks is a 33 y.o. [phone]  female being seen today for a postpartum visit. She is 5 weeks postpartum following a repeat cesarean section, low transverse incision  after a failed TOLAC at 40.4 gestational weeks. IOL: Yes, for obesity. Anesthesia: epidural.  Laceration: none.  Complications: arrest of descent and NRFHT at pushing stage. Inpatient contraception: no.   Pregnancy uncomplicated. Tobacco use: no. Substance use disorder: no. Last pap smear:     Component Value Date/Time   DIAGPAP  04/02/2023 1149    - Negative for intraepithelial lesion or malignancy (NILM)   DIAGPAP  04/30/2018 0000    NEGATIVE FOR INTRAEPITHELIAL LESIONS OR MALIGNANCY.   HPVHIGH Negative 04/02/2023 1149   ADEQPAP Satisfactory for evaluation.  The absence of an 04/02/2023 1149   ADEQPAP  04/02/2023 1149    endocervical/transformation zone component is not uncommon in pregnant   ADEQPAP patients. 04/02/2023 1149     Next pap smear due: 2027 Patient's last menstrual period was 12/30/2022.  Postpartum course has been uncomplicated. Bleeding no bleeding. Bowel function is normal. Bladder function is normal. Urinary incontinence? No, fecal incontinence? No Patient is not sexually active. Last sexual activity: prior to birth.    Upstream - 11/20/23 2841       Pregnancy Intention Screening   Does the patient want to become pregnant in the next year? No    Does the patient's partner want to become pregnant in the next year? No    Would the patient like to discuss contraceptive options today? No      Contraception Wrap Up   Current Method Abstinence    End Method Withdrawal or Other Method            The pregnancy intention screening data noted above was reviewed. Potential methods of contraception were discussed. The patient elected to proceed with Withdrawal or Other Method.  Edinburgh Postpartum  Depression Screening: Negative  Edinburgh Postnatal Depression Scale - 11/20/23 0923       Edinburgh Postnatal Depression Scale:  In the Past 7 Days   I have been able to laugh and see the funny side of things. 0    I have looked forward with enjoyment to things. 0    I have blamed myself unnecessarily when things went wrong. 0    I have been anxious or worried for no good reason. 0    I have felt scared or panicky for no good reason. 0    Things have been getting on top of me. 0    I have been so unhappy that I have had difficulty sleeping. 0    I have felt sad or miserable. 0    I have been so unhappy that I have been crying. 0    The thought of harming myself has occurred to me. 0    Edinburgh Postnatal Depression Scale Total 0            Baby's course has been uncomplicated. Baby is feeding by breast: milk supply adequate. Infant has a pediatrician/family doctor? Yes.  Childcare strategy if returning to work/school:  yes.  Pt has material needs met for her and baby: Yes.   Review of Systems:   Pertinent items are noted in HPI Denies Abnormal vaginal discharge w/ itching/odor/irritation, headaches, visual changes, shortness of breath, chest pain, abdominal pain, severe nausea/vomiting, or problems with urination or  bowel movements. Pertinent History Reviewed:  Reviewed past medical,surgical, obstetrical and family history.  Reviewed problem list, medications and allergies. OB History  Gravida Para Term Preterm AB Living  3 3 3  0 0 3  SAB IAB Ectopic Multiple Live Births  0 0 0 0 3    # Outcome Date GA Lbr Len/2nd Weight Sex Type Anes PTL Lv  3 Term 10/11/23 [redacted]w[redacted]d 01:41 / 04:29 8 lb 6.8 oz (3.82 kg) F CS-LTranv EPI  LIV  2 Term 04/29/20 [redacted]w[redacted]d  7 lb 12.5 oz (3.53 kg) M CS-LTranv EPI N LIV     Complications: Fetal Intolerance, Placental abruption  1 Term 11/09/18 [redacted]w[redacted]d 28:34 / 01:12 8 lb 6.6 oz (3.816 kg) F Vag-Spont EPI, Local N LIV     Birth Comments: WNL   Physical  Assessment:   Vitals:   11/20/23 0923  BP: 104/72  Pulse: 90  Weight: 211 lb (95.7 kg)  Height: 5\' 2"  (1.575 m)  Body mass index is 38.59 kg/m.  Objective:  Blood pressure 104/72, pulse 90, height 5\' 2"  (1.575 m), weight 211 lb (95.7 kg), last menstrual period 12/30/2022, currently breastfeeding.  General:  alert, cooperative, and no distress   Breasts:  negative  Lungs: Normal respiratory effort  Heart:  regular rate and rhythm  Abdomen: soft, non-tender, incision well healed   Vulva:  not evaluated  Vagina: not evaluated  Cervix:  normal  Corpus: Well involuted  Adnexa:  not evaluated  Rectal Exam: no hemorrhoids          No results found for this or any previous visit (from the past 24 hours).  Assessment & Plan:  1) Postpartum exam 2) 5 wks s/p repeat cesarean section, low transverse incision, VBAC attempted, and cesarean indication: failure to progress: arrest of descent and non-reassuring fetal status 3) breast feeding 4) Depression screening 5) Contraception counseling  Essential components of care per ACOG recommendations:  1.  Mood and well being:  If positive depression screen, discussed and plan developed.  If using tobacco we discussed reduction/cessation and risk of relapse If current substance abuse, we discussed and referral to local resources was offered.   2. Infant care and feeding:  If breastfeeding, discussed returning to work, pumping, breastfeeding-associated pain, guidance regarding return to fertility while lactating if not using another method. If needed, patient was provided with a letter to be allowed to pump q 2-3hrs to support lactation in a private location with access to a refrigerator to store breastmilk.   Recommended that all caregivers be immunized for flu, pertussis and other preventable communicable diseases If pt does not have material needs met for her/baby, referred to local resources for help obtaining these.  3. Sexuality,  contraception and birth spacing Provided guidance regarding sexuality, management of dyspareunia, and resumption of intercourse Discussed avoiding interpregnancy interval <83mths and recommended birth spacing of 18 months  4. Sleep and fatigue Discussed coping options for fatigue and sleep disruption Encouraged family/partner/community support of 4 hrs of uninterrupted sleep to help with mood and fatigue  5. Physical recovery  If pt had a C/S, assessed incisional pain and providing guidance on normal vs prolonged recovery If pt had a laceration, perineal healing and pain reviewed.  If urinary or fecal incontinence, discussed management and referred to PT or uro/gyn if indicated  Patient is safe to resume physical activity. Discussed attainment of healthy weight.  6.  Chronic disease management Discussed pregnancy complications if any, and their implications for future childbearing and  long-term maternal health. Review recommendations for prevention of recurrent pregnancy complications, such as aspirin to reduce risk of preeclampsia not applicable. Pt had GDM: No. If yes, 2hr GTT scheduled: not applicable. Reviewed medications and non-pregnant dosing including consideration of whether pt is breastfeeding using a reliable resource such as LactMed: not applicable Referred for f/u w/ PCP or subspecialist providers as indicated: not applicable  7. Health maintenance Mammogram at 33yo or earlier if indicated Pap smears as indicated  Meds: No orders of the defined types were placed in this encounter.   Follow-up: No follow-ups on file.   No orders of the defined types were placed in this encounter.      Majel Scott DNP, CNM Center for [employer], [facility] 11/20/2023 9:34 AM
